# Patient Record
Sex: Male | Born: 1945 | Race: Black or African American | Hispanic: No | Marital: Married | Smoking: Former smoker
Health system: Southern US, Community
[De-identification: ages and names within clinical notes are randomized; demographics above are authoritative.]

## PROBLEM LIST (undated history)

## (undated) DIAGNOSIS — K449 Diaphragmatic hernia without obstruction or gangrene: Secondary | ICD-10-CM

## (undated) DIAGNOSIS — I10 Essential (primary) hypertension: Secondary | ICD-10-CM

## (undated) DIAGNOSIS — T7840XA Allergy, unspecified, initial encounter: Secondary | ICD-10-CM

## (undated) DIAGNOSIS — I700 Atherosclerosis of aorta: Secondary | ICD-10-CM

## (undated) DIAGNOSIS — Z8719 Personal history of other diseases of the digestive system: Secondary | ICD-10-CM

## (undated) DIAGNOSIS — K760 Fatty (change of) liver, not elsewhere classified: Secondary | ICD-10-CM

## (undated) DIAGNOSIS — E785 Hyperlipidemia, unspecified: Secondary | ICD-10-CM

## (undated) DIAGNOSIS — H40009 Preglaucoma, unspecified, unspecified eye: Secondary | ICD-10-CM

## (undated) DIAGNOSIS — K2970 Gastritis, unspecified, without bleeding: Secondary | ICD-10-CM

## (undated) DIAGNOSIS — Z86010 Personal history of colonic polyps: Secondary | ICD-10-CM

## (undated) DIAGNOSIS — C61 Malignant neoplasm of prostate: Secondary | ICD-10-CM

## (undated) DIAGNOSIS — D500 Iron deficiency anemia secondary to blood loss (chronic): Secondary | ICD-10-CM

## (undated) DIAGNOSIS — B9681 Helicobacter pylori [H. pylori] as the cause of diseases classified elsewhere: Secondary | ICD-10-CM

## (undated) DIAGNOSIS — K5790 Diverticulosis of intestine, part unspecified, without perforation or abscess without bleeding: Secondary | ICD-10-CM

## (undated) HISTORY — DX: Preglaucoma, unspecified, unspecified eye: H40.009

## (undated) HISTORY — PX: PROSTATE BIOPSY: SHX241

## (undated) HISTORY — DX: Allergy, unspecified, initial encounter: T78.40XA

## (undated) HISTORY — DX: Malignant neoplasm of prostate: C61

## (undated) HISTORY — DX: Fatty (change of) liver, not elsewhere classified: K76.0

## (undated) HISTORY — DX: Essential (primary) hypertension: I10

## (undated) HISTORY — DX: Iron deficiency anemia secondary to blood loss (chronic): D50.0

## (undated) HISTORY — DX: Helicobacter pylori (H. pylori) as the cause of diseases classified elsewhere: B96.81

## (undated) HISTORY — DX: Diverticulosis of intestine, part unspecified, without perforation or abscess without bleeding: K57.90

## (undated) HISTORY — DX: Personal history of colonic polyps: Z86.010

## (undated) HISTORY — PX: NO PAST SURGERIES: SHX2092

## (undated) HISTORY — DX: Atherosclerosis of aorta: I70.0

## (undated) HISTORY — DX: Diaphragmatic hernia without obstruction or gangrene: K44.9

## (undated) HISTORY — DX: Gastritis, unspecified, without bleeding: K29.70

## (undated) HISTORY — DX: Hyperlipidemia, unspecified: E78.5

---

## 2011-01-12 HISTORY — PX: COLONOSCOPY: SHX174

## 2011-11-04 ENCOUNTER — Ambulatory Visit (INDEPENDENT_AMBULATORY_CARE_PROVIDER_SITE_OTHER): Payer: BC Managed Care – PPO | Admitting: Internal Medicine

## 2011-11-04 ENCOUNTER — Encounter: Payer: Self-pay | Admitting: Internal Medicine

## 2011-11-04 VITALS — BP 154/82 | HR 72 | Temp 97.9°F | Ht 70.25 in | Wt 204.0 lb

## 2011-11-04 DIAGNOSIS — I10 Essential (primary) hypertension: Secondary | ICD-10-CM | POA: Insufficient documentation

## 2011-11-04 DIAGNOSIS — Z0000 Encounter for general adult medical examination without abnormal findings: Secondary | ICD-10-CM

## 2011-11-04 DIAGNOSIS — Z23 Encounter for immunization: Secondary | ICD-10-CM

## 2011-11-04 LAB — CBC WITH DIFFERENTIAL/PLATELET
Basophils Relative: 0.4 % (ref 0.0–3.0)
Eosinophils Relative: 4.5 % (ref 0.0–5.0)
Hemoglobin: 13.7 g/dL (ref 13.0–17.0)
Lymphocytes Relative: 22.8 % (ref 12.0–46.0)
MCV: 89.9 fl (ref 78.0–100.0)
Monocytes Absolute: 0.4 10*3/uL (ref 0.1–1.0)
Neutro Abs: 3.4 10*3/uL (ref 1.4–7.7)
Neutrophils Relative %: 64.2 % (ref 43.0–77.0)
RBC: 4.67 Mil/uL (ref 4.22–5.81)
WBC: 5.3 10*3/uL (ref 4.5–10.5)

## 2011-11-04 NOTE — Assessment & Plan Note (Signed)
Tdap today Declined a flu shot ("I got sick the last time "), benefits discussed  Discussed zostavax EKG-- nsr  labs refer to a colonoscopy, although he is asymptomatic I did find blood in the rectum (no mass or polyps felt.) Skin lesions-- observation, to call if changing color, bleeding or irritation (lesions unchanged  for years) Former pipe smoker, very poor dentition, strongly recommend to see a dentist for dental extraction and cancer screening

## 2011-11-04 NOTE — Patient Instructions (Addendum)
Check the  blood pressure 2 or 3 times a week, be sure it is between 110/60 and 140/85. If it is consistently higher or lower, let me know Come back in 6 months

## 2011-11-04 NOTE — Assessment & Plan Note (Signed)
slt elevated, low salt , exercise See instructions RTC 6 months

## 2011-11-04 NOTE — Progress Notes (Signed)
  Subjective:    Patient ID: Joseph Golden, male    DOB: 1945-08-23, 66 y.o.   MRN: [id number]  HPI New patient, request a CPX, last visit to a doctor approximately 13 years ago. In general feels well, BP today slightly elevated, at a health fair last year he got a similar reading, 154/82. Has 2 skin lesions for years, they have not changed or bleed but he likes me to look at them.  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 No dysuria gross hematuria No anxiety or depression     Objective:   Physical Exam General -- alert, well-developed, and well-nourished.   Neck --no thyromegaly , no LADs HEENT-- poor dentition Lungs -- normal respiratory effort, no intercostal retractions, no accessory muscle use, and normal breath sounds.   Heart-- normal rate, regular rhythm, no murmur, and no gallop.   Abdomen--soft, non-tender, no distention, no masses, no HSM, no guarding, and no rigidity.   Extremities-- no pretibial edema bilaterally Skin-- 1/2 cm skin colored, polypoid, non vascular lesions at the back left thigh  Rectal-- No external abnormalities noted. Normal sphincter tone. No rectal masses or tenderness. Brown stool, + red blood noted, hemoccult (-) Prostate:  Prostate gland firm and smooth, no enlargement, nodularity, tenderness, mass, asymmetry or induration. Neurologic-- alert & oriented X3 and strength normal in all extremities. Psych-- Cognition and judgment appear  intact. Alert and cooperative with normal attention span and concentration.  not anxious appearing and not depressed appearing.      Assessment & Plan:

## 2011-11-05 ENCOUNTER — Encounter: Payer: Self-pay | Admitting: Internal Medicine

## 2011-11-05 LAB — LIPID PANEL
Cholesterol: 213 mg/dL — ABNORMAL HIGH (ref 0–200)
VLDL: 12.4 mg/dL (ref 0.0–40.0)

## 2011-11-05 LAB — COMPREHENSIVE METABOLIC PANEL
ALT: 14 U/L (ref 0–53)
Albumin: 3.6 g/dL (ref 3.5–5.2)
CO2: 28 mEq/L (ref 19–32)
Chloride: 107 mEq/L (ref 96–112)
GFR: 88.45 mL/min (ref >60.00)
Glucose, Bld: 78 mg/dL (ref 70–99)
Potassium: 4.6 mEq/L (ref 3.5–5.1)
Sodium: 140 mEq/L (ref 135–145)
Total Bilirubin: 0.5 mg/dL (ref 0.3–1.2)
Total Protein: 7.3 g/dL (ref 6.0–8.3)

## 2011-11-05 LAB — LDL CHOLESTEROL, DIRECT: Direct LDL: 169.7 mg/dL

## 2011-11-08 ENCOUNTER — Encounter: Payer: Self-pay | Admitting: *Deleted

## 2011-12-16 ENCOUNTER — Encounter: Payer: Self-pay | Admitting: Internal Medicine

## 2011-12-16 ENCOUNTER — Ambulatory Visit (AMBULATORY_SURGERY_CENTER): Payer: BC Managed Care – PPO | Admitting: *Deleted

## 2011-12-16 VITALS — Ht 70.25 in | Wt 200.0 lb

## 2011-12-16 DIAGNOSIS — Z1211 Encounter for screening for malignant neoplasm of colon: Secondary | ICD-10-CM

## 2011-12-16 MED ORDER — SUPREP BOWEL PREP KIT 17.5-3.13-1.6 GM/177ML PO SOLN: ORAL | Status: DC

## 2011-12-30 ENCOUNTER — Encounter: Payer: Self-pay | Admitting: Internal Medicine

## 2011-12-30 ENCOUNTER — Ambulatory Visit (AMBULATORY_SURGERY_CENTER): Payer: BC Managed Care – PPO | Admitting: Internal Medicine

## 2011-12-30 VITALS — BP 193/109 | HR 59 | Temp 98.5°F | Resp 19 | Ht 70.25 in | Wt 200.0 lb

## 2011-12-30 DIAGNOSIS — Z860100 Personal history of colon polyps, unspecified: Secondary | ICD-10-CM

## 2011-12-30 DIAGNOSIS — K5730 Diverticulosis of large intestine without perforation or abscess without bleeding: Secondary | ICD-10-CM

## 2011-12-30 DIAGNOSIS — D126 Benign neoplasm of colon, unspecified: Secondary | ICD-10-CM

## 2011-12-30 DIAGNOSIS — Z1211 Encounter for screening for malignant neoplasm of colon: Secondary | ICD-10-CM

## 2011-12-30 HISTORY — DX: Personal history of colon polyps, unspecified: Z86.0100

## 2011-12-30 HISTORY — DX: Personal history of colonic polyps: Z86.010

## 2011-12-30 MED ORDER — SODIUM CHLORIDE 0.9 % IV SOLN: 500.0000 mL | INTRAVENOUS | Status: DC

## 2011-12-30 NOTE — Patient Instructions (Addendum)
One polyp was removed from the colon. You also have diverticulosis. Please read the handouts provided.  Your blood pressure was elevated today - I recommend you make an appointment with Dr. Drue Novel to review and take the printout of the blood pressures we gave you for him to see.  I will send a letter about the polyp results and when to have another colonoscopy.  Thank you for choosing me and [facility].  Iva Boop, MD, FACG   YOU HAD AN ENDOSCOPIC PROCEDURE TODAY AT THE [facility]: Refer to the procedure report that was given to you for any specific questions about what was found during the examination.  If the procedure report does not answer your questions, please call your gastroenterologist to clarify.  If you requested that your care partner not be given the details of your procedure findings, then the procedure report has been included in a sealed envelope for you to review at your convenience later.  YOU SHOULD EXPECT: Some feelings of bloating in the abdomen. Passage of more gas than usual.  Walking can help get rid of the air that was put into your GI tract during the procedure and reduce the bloating. If you had a lower endoscopy (such as a colonoscopy or flexible sigmoidoscopy) you may notice spotting of blood in your stool or on the toilet paper. If you underwent a bowel prep for your procedure, then you may not have a normal bowel movement for a few days.  DIET: Your first meal following the procedure should be a light meal and then it is ok to progress to your normal diet.  A half-sandwich or bowl of soup is an example of a good first meal.  Heavy or fried foods are harder to digest and may make you feel nauseous or bloated.  Likewise meals heavy in dairy and vegetables can cause extra gas to form and this can also increase the bloating.  Drink plenty of fluids but you should avoid alcoholic beverages for 24 hours.  ACTIVITY: Your care partner should  take you home directly after the procedure.  You should plan to take it easy, moving slowly for the rest of the day.  You can resume normal activity the day after the procedure however you should NOT DRIVE or use heavy machinery for 24 hours (because of the sedation medicines used during the test).    SYMPTOMS TO REPORT IMMEDIATELY: A gastroenterologist can be reached at any hour.  During normal business hours, 8:30 AM to 5:00 PM Monday through Friday, call [phone].  After hours and on weekends, please call the GI answering service at [phone] who will take a message and have the physician on call contact you.   Following lower endoscopy (colonoscopy or flexible sigmoidoscopy):  Excessive amounts of blood in the stool  Significant tenderness or worsening of abdominal pains  Swelling of the abdomen that is new, acute  Fever of 100F or higher  FOLLOW UP: If any biopsies were taken you will be contacted by phone or by letter within the next 1-3 weeks.  Call your gastroenterologist if you have not heard about the biopsies in 3 weeks.  Our staff will call the home number listed on your records the next business day following your procedure to check on you and address any questions or concerns that you may have at that time regarding the information given to you following your procedure. This is a courtesy call and so if there is  no answer at the home number and we have not heard from you through the emergency physician on call, we will assume that you have returned to your regular daily activities without incident.  SIGNATURES/CONFIDENTIALITY: You and/or your care partner have signed paperwork which will be entered into your electronic medical record.  These signatures attest to the fact that that the information above on your After Visit Summary has been reviewed and is understood.  Full responsibility of the confidentiality of this discharge information lies with you and/or your  care-partner.   Polyps-handout given  Diverticulosis-handout given  High fiber diet-handout given

## 2011-12-30 NOTE — Op Note (Signed)
[facility] [address] [place], [address]   COLONOSCOPY PROCEDURE REPORT  PATIENT: Joseph Golden, Joseph Golden  MR#: [id number] BIRTHDATE: 1945-05-17 , 66  yrs. old GENDER: Male ENDOSCOPIST: Iva Boop, MD, [facility] REFERRED XB:JYNW Drue Novel, M.D. PROCEDURE DATE:  12/30/2011 PROCEDURE:   Colonoscopy with snare polypectomy ASA CLASS:   Class II INDICATIONS:average risk screening. MEDICATIONS: propofol (Diprivan) 300mg  IV, MAC sedation, administered by CRNA, and These medications were titrated to patient response per physician's verbal order  DESCRIPTION OF PROCEDURE:   After the risks benefits and alternatives of the procedure were thoroughly explained, informed consent was obtained.  A digital rectal exam revealed no abnormalities of the rectum and A digital rectal exam revealed the prostate was not enlarged.   The LB CF-[id number]  endoscope was introduced through the anus and advanced to the cecum, which was identified by both the appendix and ileocecal valve. No adverse events experienced.   The quality of the prep was Suprep excellent The instrument was then slowly withdrawn as the colon was fully examined.      COLON FINDINGS: A polypoid shaped pedunculated polyp measuring 12 mm in size was found in the sigmoid colon.  A polypectomy was performed using snare cautery.  The resection was complete and the polyp tissue was completely retrieved.   Moderate diverticulosis was noted in the sigmoid colon.   The colon mucosa was otherwise normal.   A right colon retroflexion was performed.  Retroflexed views revealed no abnormalities. The time to cecum=3 minutes 05 seconds.  Withdrawal time=12 minutes 18 seconds.  The scope was withdrawn and the procedure completed. COMPLICATIONS: There were no complications.  ENDOSCOPIC IMPRESSION: 1.   Pedunculated polyp measuring 12 mm in size was found in the sigmoid colon; polypectomy was performed using snare cautery 2.    Moderate diverticulosis was noted in the sigmoid colon 3.   The colon mucosa was otherwise normal - excellent prep  RECOMMENDATIONS: 1.  Hold aspirin, aspirin products, and anti-inflammatory medication for 2 weeks. 2.  Timing of repeat colonoscopy will be determined by pathology findings. 3.  return to Dr. Drue Novel soon to follow-up elevated blood pressure - may need treatment to prevent complications of high blood pressure eSigned:  Iva Boop, MD, [facility] 12/30/2011 10:30 AM   cc: The Patient and Willow Ora, MD

## 2011-12-30 NOTE — Progress Notes (Signed)
Called to room to assist during endoscopic procedure.  Patient ID and intended procedure confirmed with present staff. Received instructions for my participation in the procedure from the performing physician. ewm 

## 2011-12-30 NOTE — Progress Notes (Signed)
Patient did not experience any of the following events: a burn prior to discharge; a fall within the facility; wrong site/side/patient/procedure/implant event; or a hospital transfer or hospital admission upon discharge from the facility. (G8907) Patient did not have preoperative order for IV antibiotic SSI prophylaxis. (G8918)  

## 2011-12-31 ENCOUNTER — Telehealth: Payer: Self-pay | Admitting: *Deleted

## 2011-12-31 NOTE — Telephone Encounter (Signed)
  Follow up Call-  Call back number 12/30/2011  Post procedure Call Back phone  # [phone]  Permission to leave phone message Yes     Patient questions:  Do you have a fever, pain , or abdominal swelling? no Pain Score  0 *  Have you tolerated food without any problems? yes  Have you been able to return to your normal activities? yes  Do you have any questions about your discharge instructions: Diet   no Medications  no Follow up visit  no  Do you have questions or concerns about your Care? no  Actions: * If pain score is 4 or above: No action needed, pain <4.

## 2012-01-07 ENCOUNTER — Encounter: Payer: Self-pay | Admitting: Internal Medicine

## 2012-01-07 DIAGNOSIS — Z860100 Personal history of colon polyps, unspecified: Secondary | ICD-10-CM | POA: Insufficient documentation

## 2012-01-07 NOTE — Progress Notes (Signed)
Quick Note:  Adenoma with high-grade dysplasia in 70% Fragmented specimen, cannot ensure complete removal though I believe so For completeness will plan on flex sig in 3 months approximately (March 2014) ______

## 2012-03-24 ENCOUNTER — Encounter: Payer: Self-pay | Admitting: Internal Medicine

## 2012-05-04 ENCOUNTER — Ambulatory Visit (INDEPENDENT_AMBULATORY_CARE_PROVIDER_SITE_OTHER): Payer: BC Managed Care – PPO | Admitting: Internal Medicine

## 2012-05-04 ENCOUNTER — Encounter: Payer: Self-pay | Admitting: Internal Medicine

## 2012-05-04 VITALS — BP 150/90 | HR 69 | Temp 98.0°F | Wt 202.0 lb

## 2012-05-04 DIAGNOSIS — Z86010 Personal history of colonic polyps: Secondary | ICD-10-CM

## 2012-05-04 DIAGNOSIS — I10 Essential (primary) hypertension: Secondary | ICD-10-CM

## 2012-05-04 MED ORDER — LOSARTAN POTASSIUM-HCTZ 50-12.5 MG PO TABS: 1.0000 | ORAL_TABLET | Freq: Every day | ORAL | Status: DC

## 2012-05-04 NOTE — Patient Instructions (Addendum)
Started a new BP medication, take one in the morning. Check the  blood pressure 2 or 3 times a week, be sure it is between 110/60 and 140/85. If it is consistently higher or lower, let me know Please come back in 2 or 3 weeks: BMP--- dx  hypertension FLP --- dx mild hyperlipidemia Next office visit with me in 4 months

## 2012-05-04 NOTE — Assessment & Plan Note (Addendum)
Based on ambulatory BPs and BP today, he has hypertension. He already has improved his lifestyle. Plan: Losartan, BMP in 2 weeks. Will also check FLP, cholesterol was slightly elevated.

## 2012-05-04 NOTE — Progress Notes (Signed)
  Subjective:    Patient ID: Joseph Golden, male    DOB: 01-14-1945, 67 y.o.   MRN: [id number]  HPI Follow up from previous visit Elevated BP-- has improved his diet, not eating salt as much as he did before. Ambulatory BPs are still elevated at around 150- 140/90- 97. Had  a colonoscopy, see assessment and plan. Has a  dark spot in the back for one month, see physical exam and recommendations.Area was somehow tender at the beginning.  Past Medical History  Diagnosis Date  . 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 HTN (hypertension)    Past Surgical History  Procedure Laterality Date  . No past surgeries       Review of Systems Denies blood in the stools. No chest pain, nosebleeds or headaches.    Objective:   Physical Exam  Skin:       General -- alert, well-developed, No apparent distress  Lungs -- normal respiratory effort, no intercostal retractions, no accessory muscle use, and normal breath sounds.   Heart-- normal rate, regular rhythm, no murmur, and no gallop.   Extremities-- no pretibial edema bilaterally Neurologic-- alert & oriented X3 and strength normal in all extremities. Psych-- Cognition and judgment appear intact. Alert and cooperative with normal attention span and concentration.  not anxious appearing and not depressed appearing.        Assessment & Plan:  "Skin lesion" consistent with a ecchymoses, recommend observation, if the area is not back to normal and in one month, he is to let me know.

## 2012-05-04 NOTE — Assessment & Plan Note (Signed)
Due for a repeat at scope (flex bronchoscopy). Encouraged to call GI, states he will

## 2012-05-23 ENCOUNTER — Other Ambulatory Visit (INDEPENDENT_AMBULATORY_CARE_PROVIDER_SITE_OTHER): Payer: BC Managed Care – PPO

## 2012-05-23 DIAGNOSIS — I10 Essential (primary) hypertension: Secondary | ICD-10-CM

## 2012-05-23 LAB — BASIC METABOLIC PANEL
BUN: 18 mg/dL (ref 6–23)
Creatinine, Ser: 1.2 mg/dL (ref 0.4–1.5)
GFR: 82.12 mL/min (ref >60.00)
Potassium: 3.7 mEq/L (ref 3.5–5.1)

## 2012-05-23 LAB — LIPID PANEL
Cholesterol: 207 mg/dL — ABNORMAL HIGH (ref 0–200)
VLDL: 11 mg/dL (ref 0.0–40.0)

## 2012-08-31 ENCOUNTER — Encounter: Payer: Self-pay | Admitting: Internal Medicine

## 2012-08-31 ENCOUNTER — Ambulatory Visit (INDEPENDENT_AMBULATORY_CARE_PROVIDER_SITE_OTHER): Payer: Medicare HMO | Admitting: Internal Medicine

## 2012-08-31 VITALS — BP 160/60 | HR 61 | Temp 98.4°F | Wt 200.0 lb

## 2012-08-31 DIAGNOSIS — Z86010 Personal history of colonic polyps: Secondary | ICD-10-CM

## 2012-08-31 DIAGNOSIS — I10 Essential (primary) hypertension: Secondary | ICD-10-CM

## 2012-08-31 MED ORDER — AMLODIPINE BESYLATE 10 MG PO TABS: 10.0000 mg | ORAL_TABLET | Freq: Every day | ORAL | Status: DC

## 2012-08-31 NOTE — Progress Notes (Signed)
  Subjective:    Patient ID: Joseph Golden, male    DOB: 04/21/1945, 67 y.o.   MRN: [id number]  HPI F/U Hypertension, started losartan HCT, good compliance, ambulatory BP is still range from 160/94, 180/100 No apparent side effects however he reports that when he "pays attention" he has some tinnitus which is mild and not bothersome  Past Medical History  Diagnosis Date  . 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 HTN (hypertension)    Past Surgical History  Procedure Laterality Date  . No past surgeries       Review of Systems Did not call  GI for flex sigmoidoscopy Denies chest pain, shortness of breath. No blood in the stools. Hearing is normal.     Objective:   Physical Exam  BP 160/60  Pulse 61  Temp(Src) 98.4 F (36.9 C)  Wt 200 lb (90.719 kg)  BMI 28.5 kg/m2  SpO2 96% General -- alert, well-developed, NAD.   Lungs -- normal respiratory effort, no intercostal retractions, no accessory muscle use, and normal breath sounds.  Heart-- normal rate, regular rhythm, no murmur.  Skin--Previously described ecchymosis in the back  is gone,  does home macular  hyperpigmentation. Psych-- Cognition and judgment appear intact. Alert and cooperative with normal attention span and concentration. not anxious appearing and not depressed appearing.      Assessment & Plan:   Skin abnormality in the back, see last OV,  likely postinflammatory hyperpigmentation base on today's exam, recommend observation, will call if changes

## 2012-08-31 NOTE — Patient Instructions (Addendum)
We are adding a medication called  amlodipine, one tablet every day. Your BP should be getting better in the next few weeks. Please continue checking, goal is  less than 140/80. If you have any side effects please let us know. Next visit 2 months for a physical, please make an appointment

## 2012-08-31 NOTE — Assessment & Plan Note (Signed)
Due for a repeat flex sigmoidoscopy, did not call GI. \plan-- GI referral

## 2012-08-31 NOTE — Assessment & Plan Note (Signed)
Tolerated  losartan HCT 50-12.5  . BP needs better control, Because a question of tinnitus, I don't like to increase the dose of current meds Plan:  Add Amlodipine 10 mg

## 2012-09-01 ENCOUNTER — Other Ambulatory Visit: Payer: Self-pay | Admitting: Internal Medicine

## 2012-09-01 NOTE — Telephone Encounter (Signed)
Refill done per protocol.  

## 2012-10-27 ENCOUNTER — Telehealth: Payer: Self-pay

## 2012-10-27 NOTE — Telephone Encounter (Signed)
Medication List and allergies: done  90 day supply/mail order: none Local prescriptions: [facility]  Immunizations due: admin flu vaccine upon arrival  A/P:  LAST: PSA: WNL 10/2011   CCS: Follow up scheduled for 11/2014 with Dr Leone Payor DM:  Due 05/2012 Eye Exam:   HTN: Due 08/2012 Lipids: Due 05/2012  Recent family history or surgical procedures: none   To Discuss with Provider: none

## 2012-10-31 ENCOUNTER — Encounter: Payer: Self-pay | Admitting: Internal Medicine

## 2012-10-31 ENCOUNTER — Ambulatory Visit (INDEPENDENT_AMBULATORY_CARE_PROVIDER_SITE_OTHER): Payer: Medicare HMO | Admitting: Internal Medicine

## 2012-10-31 VITALS — BP 131/77 | HR 81 | Temp 98.4°F | Ht 70.0 in | Wt 200.0 lb

## 2012-10-31 DIAGNOSIS — Z23 Encounter for immunization: Secondary | ICD-10-CM

## 2012-10-31 DIAGNOSIS — Z125 Encounter for screening for malignant neoplasm of prostate: Secondary | ICD-10-CM

## 2012-10-31 DIAGNOSIS — E785 Hyperlipidemia, unspecified: Secondary | ICD-10-CM

## 2012-10-31 DIAGNOSIS — I10 Essential (primary) hypertension: Secondary | ICD-10-CM

## 2012-10-31 DIAGNOSIS — Z0000 Encounter for general adult medical examination without abnormal findings: Secondary | ICD-10-CM

## 2012-10-31 HISTORY — DX: Hyperlipidemia, unspecified: E78.5

## 2012-10-31 LAB — CBC WITH DIFFERENTIAL/PLATELET
Basophils Relative: 0.2 % (ref 0.0–3.0)
Eosinophils Relative: 3.5 % (ref 0.0–5.0)
HCT: 36.2 % — ABNORMAL LOW (ref 39.0–52.0)
Lymphs Abs: 1 10*3/uL (ref 0.7–4.0)
MCV: 86.4 fl (ref 78.0–100.0)
Monocytes Absolute: 0.6 10*3/uL (ref 0.1–1.0)
RBC: 4.19 Mil/uL — ABNORMAL LOW (ref 4.22–5.81)
WBC: 7.6 10*3/uL (ref 4.5–10.5)

## 2012-10-31 LAB — BASIC METABOLIC PANEL
Chloride: 103 mEq/L (ref 96–112)
Potassium: 4.4 mEq/L (ref 3.5–5.1)
Sodium: 139 mEq/L (ref 135–145)

## 2012-10-31 LAB — LIPID PANEL: Total CHOL/HDL Ratio: 3

## 2012-10-31 LAB — PSA: PSA: 2.31 ng/mL (ref 0.10–4.00)

## 2012-10-31 MED ORDER — ZOSTER VACCINE LIVE 19400 UNT/0.65ML ~~LOC~~ SOLR: 0.6500 mL | Freq: Once | SUBCUTANEOUS | Status: DC

## 2012-10-31 MED ORDER — LOSARTAN POTASSIUM-HCTZ 50-12.5 MG PO TABS: ORAL_TABLET | ORAL | Status: DC

## 2012-10-31 MED ORDER — AMLODIPINE BESYLATE 10 MG PO TABS: 10.0000 mg | ORAL_TABLET | Freq: Every day | ORAL | Status: DC

## 2012-10-31 NOTE — Assessment & Plan Note (Signed)
Tdap 2013 Got a flu shot today Discussed zostavax-- rx provided   12-2011 colonoscopy, had  A large polyp, f/u by GI Former pipe smoker, again this year recommend to see a dentist for dental extraction and cancer screening Diet and exercise discussed. Labs

## 2012-10-31 NOTE — Assessment & Plan Note (Signed)
BP today is satisfactory, ambulatory BPs mostly ~ 130/80. No change, labs

## 2012-10-31 NOTE — Progress Notes (Signed)
Subjective:    Patient ID: Joseph Golden, male    DOB: 1945-09-25, 67 y.o.   MRN: [id number]  HPI Here for Medicare AWV: 1. Risk factors based on Past M, S, F history: reviewed 2. Physical Activities:  Active , exercises M-F has mini gym at home, active in the yard 3. Depression/mood: neg screening  4. Hearing:  No problemss noted or reported  5. ADL's:  Independent  6. Fall Risk: no recent fall, see instructions  7. home Safety: does feel safe at home  8. Height, weight, & visual acuity: see VS, saw the ey doctor 05-2011, got new glasses, rec yearly visit  9. Counseling: provided 10. Labs ordered based on risk factors: if needed  11. Referral Coordination: if needed 12. Care Plan, see assessment and plan  13. Cognitive Assessment: cognition and motor skills wnl for age   In addition, today we discussed the following: HTN-- good med compliance , amb BPs usually in the 130s (range 120-160), DBP 80s  Past Medical History  Diagnosis Date  . 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 HTN (hypertension)    Past Surgical History  Procedure Laterality Date  . No past surgeries     History   Social History  . Marital Status: Married    Spouse Name: N/A    Number of Children: 5  . Years of Education: N/A   Occupational History  . retired     Social History Main Topics  . Smoking status: Former Games developer  . Smokeless tobacco: Never Used     Comment: years ago  . Alcohol Use: 1.2 oz/week    2 Cans of beer per week     Comment: socially   . Drug Use: No  . Sexual Activity: Not on file   Other Topics Concern  . Not on file   Social History Narrative   Lives with wife , and 2 daughters    Family History  Problem Relation Age of Onset  . Colon cancer Neg Hx   . Esophageal cancer Neg Hx   . Rectal cancer Neg Hx   . Stomach cancer Neg Hx   . Prostate cancer Neg Hx   . Diabetes Neg Hx   . CAD Neg Hx   . CVA  Other     uncle     Review of Systems Diet-- healthy No  CP, SOB, lower extremity edema Denies  nausea, vomiting diarrhea Denies  blood in the stools (-) cough, sputum production (-) wheezing, chest congestion No dysuria, gross hematuria, difficulty urinating          Objective:   Physical Exam BP 131/77  Pulse 81  Temp(Src) 98.4 F (36.9 C)  Ht 5\' 10"  (1.778 m)  Wt 200 lb (90.719 kg)  BMI 28.7 kg/m2  SpO2 97% General -- alert, well-developed, NAD.  Neck --  no LAD HEENT-- Not pale.  Mouth: Has a single loose teeth, no obvious mucosal lesions (but rec to see the dentist) Lungs -- normal respiratory effort, no intercostal retractions, no accessory muscle use, and normal breath sounds.  Heart-- normal rate, regular rhythm, no murmur.  Abdomen-- Not distended, good bowel sounds,soft, non-tender. Rectal-- single small external skin tag noted. Normal sphincter tone. No rectal masses or tenderness. Brown stool Prostate--Prostate gland firm and smooth, no enlargement, nodularity, tenderness, mass, asymmetry or induration. Extremities-- no pretibial edema bilaterally  Neurologic--  alert & oriented X3.  Speech normal, gait normal, strength normal in all extremities.  Psych-- Cognition and judgment appear intact. Cooperative with normal attention span and concentration. No anxious appearing , no depressed appearing.       Assessment & Plan:

## 2012-10-31 NOTE — Patient Instructions (Signed)
Get your blood work before you leave   Check the  blood pressure 2 or 3 times a month be sure it is between 110/60 and 140/85. Ideal blood pressure is 120/80. If it is consistently higher or lower, let me know  Next visit in  1 year  for a physical exam , If your blood pressure is not well-controlled, come back sooner  Fall Prevention and Home Safety Falls cause injuries and can affect all age groups. It is possible to use preventive measures to significantly decrease the likelihood of falls. There are many simple measures which can make your home safer and prevent falls. OUTDOORS  Repair cracks and edges of walkways and driveways.  Remove high doorway thresholds.  Trim shrubbery on the main path into your home.  Have good outside lighting.  Clear walkways of tools, rocks, debris, and clutter.  Check that handrails are not broken and are securely fastened. Both sides of steps should have handrails.  Have leaves, snow, and ice cleared regularly.  Use sand or salt on walkways during winter months.  In the garage, clean up grease or oil spills. BATHROOM  Install night lights.  Install grab bars by the toilet and in the tub and shower.  Use non-skid mats or decals in the tub or shower.  Place a plastic non-slip stool in the shower to sit on, if needed.  Keep floors dry and clean up all water on the floor immediately.  Remove soap buildup in the tub or shower on a regular basis.  Secure bath mats with non-slip, double-sided rug tape.  Remove throw rugs and tripping hazards from the floors. BEDROOMS  Install night lights.  Make sure a bedside light is easy to reach.  Do not use oversized bedding.  Keep a telephone by your bedside.  Have a firm chair with side arms to use for getting dressed.  Remove throw rugs and tripping hazards from the floor. KITCHEN  Keep handles on pots and pans turned toward the center of the stove. Use back burners when possible.  Clean  up spills quickly and allow time for drying.  Avoid walking on wet floors.  Avoid hot utensils and knives.  Position shelves so they are not too high or low.  Place commonly used objects within easy reach.  If necessary, use a sturdy step stool with a grab bar when reaching.  Keep electrical cables out of the way.  Do not use floor polish or wax that makes floors slippery. If you must use wax, use non-skid floor wax.  Remove throw rugs and tripping hazards from the floor. STAIRWAYS  Never leave objects on stairs.  Place handrails on both sides of stairways and use them. Fix any loose handrails. Make sure handrails on both sides of the stairways are as long as the stairs.  Check carpeting to make sure it is firmly attached along stairs. Make repairs to worn or loose carpet promptly.  Avoid placing throw rugs at the top or bottom of stairways, or properly secure the rug with carpet tape to prevent slippage. Get rid of throw rugs, if possible.  Have an electrician put in a light switch at the top and bottom of the stairs. OTHER FALL PREVENTION TIPS  Wear low-heel or rubber-soled shoes that are supportive and fit well. Wear closed toe shoes.  When using a stepladder, make sure it is fully opened and both spreaders are firmly locked. Do not climb a closed stepladder.  Add color or contrast  paint or tape to grab bars and handrails in your home. Place contrasting color strips on first and last steps.  Learn and use mobility aids as needed. Install an electrical emergency response system.  Turn on lights to avoid dark areas. Replace light bulbs that burn out immediately. Get light switches that glow.  Arrange furniture to create clear pathways. Keep furniture in the same place.  Firmly attach carpet with non-skid or double-sided tape.  Eliminate uneven floor surfaces.  Select a carpet pattern that does not visually hide the edge of steps.  Be aware of all pets. OTHER HOME  SAFETY TIPS  Set the water temperature for 120 F (48.8 C).  Keep emergency numbers on or near the telephone.  Keep smoke detectors on every level of the home and near sleeping areas. Document Released: 12/18/2001 Document Revised: 06/29/2011 Document Reviewed: 03/19/2011 [facility] Patient Information 2014 [place], [place].

## 2012-11-02 ENCOUNTER — Encounter: Payer: Self-pay | Admitting: *Deleted

## 2012-11-02 ENCOUNTER — Ambulatory Visit (AMBULATORY_SURGERY_CENTER): Payer: Self-pay | Admitting: *Deleted

## 2012-11-02 VITALS — Ht 70.5 in | Wt 201.4 lb

## 2012-11-02 DIAGNOSIS — Z86010 Personal history of colonic polyps: Secondary | ICD-10-CM

## 2012-11-02 NOTE — Progress Notes (Signed)
No allergies to eggs or soy. No problems with anesthesia.  

## 2012-11-08 ENCOUNTER — Encounter: Payer: Self-pay | Admitting: Internal Medicine

## 2012-11-16 ENCOUNTER — Ambulatory Visit (AMBULATORY_SURGERY_CENTER): Payer: Medicare HMO | Admitting: Internal Medicine

## 2012-11-16 ENCOUNTER — Encounter: Payer: Self-pay | Admitting: Internal Medicine

## 2012-11-16 VITALS — BP 144/89 | HR 58 | Temp 96.8°F | Resp 15 | Ht 70.0 in | Wt 201.0 lb

## 2012-11-16 DIAGNOSIS — K648 Other hemorrhoids: Secondary | ICD-10-CM

## 2012-11-16 DIAGNOSIS — K5790 Diverticulosis of intestine, part unspecified, without perforation or abscess without bleeding: Secondary | ICD-10-CM

## 2012-11-16 DIAGNOSIS — D126 Benign neoplasm of colon, unspecified: Secondary | ICD-10-CM

## 2012-11-16 DIAGNOSIS — Z86010 Personal history of colonic polyps: Secondary | ICD-10-CM

## 2012-11-16 DIAGNOSIS — K5730 Diverticulosis of large intestine without perforation or abscess without bleeding: Secondary | ICD-10-CM

## 2012-11-16 HISTORY — DX: Diverticulosis of intestine, part unspecified, without perforation or abscess without bleeding: K57.90

## 2012-11-16 MED ORDER — SODIUM CHLORIDE 0.9 % IV SOLN: 500.0000 mL | INTRAVENOUS | Status: DC

## 2012-11-16 NOTE — Patient Instructions (Addendum)
The polyp was gone. You do have diverticulosis still and some internal hemorrhoids.  If you have hemorrhoid problems (swelling, itching, bleeding) I am able to treat those with an in-office procedure. If you like, please call my office at [phone] to schedule an appointment and I can evaluate you further.  Next colonoscopy around 12/2014.  I appreciate the opportunity to care for you. Iva Boop, MD, FACG  YOU HAD AN ENDOSCOPIC PROCEDURE TODAY AT THE [facility]: Refer to the procedure report that was given to you for any specific questions about what was found during the examination.  If the procedure report does not answer your questions, please call your gastroenterologist to clarify.  If you requested that your care partner not be given the details of your procedure findings, then the procedure report has been included in a sealed envelope for you to review at your convenience later.  YOU SHOULD EXPECT: Some feelings of bloating in the abdomen. Passage of more gas than usual.  Walking can help get rid of the air that was put into your GI tract during the procedure and reduce the bloating. If you had a lower endoscopy (such as a colonoscopy or flexible sigmoidoscopy) you may notice spotting of blood in your stool or on the toilet paper. If you underwent a bowel prep for your procedure, then you may not have a normal bowel movement for a few days.  DIET: Your first meal following the procedure should be a light meal and then it is ok to progress to your normal diet.  A half-sandwich or bowl of soup is an example of a good first meal.  Heavy or fried foods are harder to digest and may make you feel nauseous or bloated.  Likewise meals heavy in dairy and vegetables can cause extra gas to form and this can also increase the bloating.  Drink plenty of fluids but you should avoid alcoholic beverages for 24 hours.  ACTIVITY: Your care partner should take you home directly after the  procedure.  You should plan to take it easy, moving slowly for the rest of the day.  You can resume normal activity the day after the procedure however you should NOT DRIVE or use heavy machinery for 24 hours (because of the sedation medicines used during the test).    SYMPTOMS TO REPORT IMMEDIATELY: A gastroenterologist can be reached at any hour.  During normal business hours, 8:30 AM to 5:00 PM Monday through Friday, call [phone].  After hours and on weekends, please call the GI answering service at [phone] who will take a message and have the physician on call contact you.   Following lower endoscopy (colonoscopy or flexible sigmoidoscopy):  Excessive amounts of blood in the stool  Significant tenderness or worsening of abdominal pains  Swelling of the abdomen that is new, acute  Fever of 100F or higher  FOLLOW UP: If any biopsies were taken you will be contacted by phone or by letter within the next 1-3 weeks.  Call your gastroenterologist if you have not heard about the biopsies in 3 weeks.  Our staff will call the home number listed on your records the next business day following your procedure to check on you and address any questions or concerns that you may have at that time regarding the information given to you following your procedure. This is a courtesy call and so if there is no answer at the home number and we have not heard from  you through the emergency physician on call, we will assume that you have returned to your regular daily activities without incident.  SIGNATURES/CONFIDENTIALITY: You and/or your care partner have signed paperwork which will be entered into your electronic medical record.  These signatures attest to the fact that that the information above on your After Visit Summary has been reviewed and is understood.  Full responsibility of the confidentiality of this discharge information lies with you and/or your care-partner.  Diverticulosis,  hemorrhoids-handouts given  Colonoscopy in 2016

## 2012-11-16 NOTE — Op Note (Signed)
[facility] [address] [place], [address]   FLEXIBLE SIGMOIDOSCOPY PROCEDURE REPORT  PATIENT: Joseph, Golden  MR#: [id number] BIRTHDATE: [birth year] , 67  yrs. old GENDER: Male ENDOSCOPIST: Iva Boop, MD, [facility] PROCEDURE DATE:  11/16/2012 PROCEDURE:   Sigmoidoscopy, diagnostic ASA CLASS:   Class II INDICATIONS:follow up of colonic polyp - sigmoid polyp w/ high-grade dysplasia MEDICATIONS: propofol (Diprivan) 100mg  IV, MAC sedation, administered by CRNA, and These medications were titrated to patient response per physician's verbal order  DESCRIPTION OF PROCEDURE:   After the risks benefits and alternatives of the procedure were thoroughly explained, informed consent was obtained.  revealed no abnormalities of the rectum. The LB [id number]-[id number]  endoscope was introduced through the anus  and advanced to the descending colon , limited by No adverse events experienced.   The quality of the prep was good .  The instrument was then slowly withdrawn as the mucosa was fully examined.         COLON FINDINGS: Severe diverticulosis was noted in the sigmoid colon and descending colon. Retroflexed views revealed internal hemorrhoid.    The scope was then withdrawn from the patient and the procedure terminated.  COMPLICATIONS: There were no complications.  ENDOSCOPIC IMPRESSION: 1.   Severe diverticulosis was noted in the sigmoid colon and descending colon 2.   Internal hemorrhoids 3. No residual sigmoid polyp  RECOMMENDATIONS: colonoscopy 12/2014    eSigned:  Iva Boop, MD, [facility] 11/16/2012 10:01 AM   CC:The Patient

## 2012-11-16 NOTE — Progress Notes (Signed)
Lidocaine-40mg IV prior to Propofol InductionPropofol given over incremental dosages 

## 2012-11-16 NOTE — Progress Notes (Signed)
Patient did not experience any of the following events: a burn prior to discharge; a fall within the facility; wrong site/side/patient/procedure/implant event; or a hospital transfer or hospital admission upon discharge from the facility. (G8907) Patient did not have preoperative order for IV antibiotic SSI prophylaxis. (G8918)  

## 2012-11-17 ENCOUNTER — Telehealth: Payer: Self-pay | Admitting: *Deleted

## 2012-11-17 NOTE — Telephone Encounter (Signed)
  Follow up Call-  Call back number 11/16/2012 12/30/2011  Post procedure Call Back phone  # [phone]  Permission to leave phone message Yes Yes     Patient questions:  Do you have a fever, pain , or abdominal swelling? no Pain Score  0 *  Have you tolerated food without any problems? yes  Have you been able to return to your normal activities? yes  Do you have any questions about your discharge instructions: Diet   no Medications  no Follow up visit  no  Do you have questions or concerns about your Care? no  Actions: * If pain score is 4 or above: No action needed, pain <4.

## 2012-12-01 ENCOUNTER — Telehealth: Payer: Self-pay

## 2012-12-01 DIAGNOSIS — D649 Anemia, unspecified: Secondary | ICD-10-CM

## 2012-12-01 NOTE — Telephone Encounter (Signed)
Patient advised He will come one day the beginning of next week.   Labs entered

## 2012-12-01 NOTE — Telephone Encounter (Signed)
Message copied by Annett Fabian on Fri Dec 01, 2012 10:43 AM ------      Message from: Iva Boop      Created: Fri Dec 01, 2012  7:43 AM       Late f/u on this            I did a flex sig on him to check for a residual polyp            I will call him and check a B12 and ferritin and tell him we are working together on it - also check CBC Lavonna Rua, please do this)            Will keep you posted            Baldo Ash      ----- Message -----         From: Wanda Plump, MD         Sent: 11/02/2012   2:05 PM           To: Iva Boop, MD            Dirk Dress the patient for a physical, Hg slt low,  has an appointment to see you in few days. Just a FYI       Thank you      [facility]       ------

## 2012-12-05 ENCOUNTER — Other Ambulatory Visit (INDEPENDENT_AMBULATORY_CARE_PROVIDER_SITE_OTHER): Payer: Medicare HMO

## 2012-12-05 DIAGNOSIS — D649 Anemia, unspecified: Secondary | ICD-10-CM

## 2012-12-05 LAB — CBC WITH DIFFERENTIAL/PLATELET
Basophils Relative: 0.1 % (ref 0.0–3.0)
Eosinophils Absolute: 0.3 10*3/uL (ref 0.0–0.7)
Eosinophils Relative: 5.2 % — ABNORMAL HIGH (ref 0.0–5.0)
HCT: 34.7 % — ABNORMAL LOW (ref 39.0–52.0)
Lymphs Abs: 1.4 10*3/uL (ref 0.7–4.0)
MCHC: 33.5 g/dL (ref 30.0–36.0)
MCV: 86.8 fl (ref 78.0–100.0)
Monocytes Absolute: 0.5 10*3/uL (ref 0.1–1.0)
Neutro Abs: 4.1 10*3/uL (ref 1.4–7.7)
Platelets: 267 10*3/uL (ref 150.0–400.0)
RBC: 4 Mil/uL — ABNORMAL LOW (ref 4.22–5.81)
WBC: 6.4 10*3/uL (ref 4.5–10.5)

## 2012-12-05 LAB — VITAMIN B12: Vitamin B-12: 387 pg/mL (ref 211–911)

## 2012-12-11 NOTE — Progress Notes (Signed)
Quick Note:  Let him know iron is low Hgb slightly low Should take ferrous sulfate 325 mg bid if not taking An EGD to look for leaking of blood from stomach is appropriate - I recommend this - please schedule if he agrees or he can schedule an REV to discuss  I am ccing Dr. Drue Novel ______

## 2012-12-12 ENCOUNTER — Other Ambulatory Visit: Payer: Self-pay

## 2012-12-12 MED ORDER — IRON 325 (65 FE) MG PO TABS: 325.0000 mg | ORAL_TABLET | Freq: Two times a day (BID) | ORAL | Status: DC

## 2013-01-23 ENCOUNTER — Ambulatory Visit: Payer: Medicare HMO | Admitting: Internal Medicine

## 2013-02-26 ENCOUNTER — Encounter: Payer: Self-pay | Admitting: *Deleted

## 2013-02-27 ENCOUNTER — Ambulatory Visit: Payer: Medicare HMO | Admitting: Internal Medicine

## 2013-03-01 ENCOUNTER — Encounter: Payer: Self-pay | Admitting: Internal Medicine

## 2013-03-01 ENCOUNTER — Ambulatory Visit (INDEPENDENT_AMBULATORY_CARE_PROVIDER_SITE_OTHER): Payer: Medicare Other | Admitting: Internal Medicine

## 2013-03-01 VITALS — BP 126/60 | HR 80 | Ht 69.0 in | Wt 196.1 lb

## 2013-03-01 DIAGNOSIS — D509 Iron deficiency anemia, unspecified: Secondary | ICD-10-CM

## 2013-03-01 DIAGNOSIS — D500 Iron deficiency anemia secondary to blood loss (chronic): Secondary | ICD-10-CM

## 2013-03-01 HISTORY — DX: Iron deficiency anemia secondary to blood loss (chronic): D50.0

## 2013-03-01 NOTE — Patient Instructions (Signed)
You have been scheduled for an endoscopy with propofol. Please follow written instructions given to you at your visit today. If you use inhalers (even only as needed), please bring them with you on the day of your procedure.  I appreciate the opportunity to care for you.  

## 2013-03-01 NOTE — Progress Notes (Signed)
         Subjective:    Patient ID: Joseph Golden, male    DOB: [birth year], 68 y.o.   MRN: [id number]  HPI The patient is here to discuss further w/u of iron deficiency anemia. He had a colonoscopy in 2013 and f/u sigmoidoscopy to ensure polyp removal in 2014. No signs of bleeding Is taking iron. Not a blood donor in many years. Does eat iron.  Medications, allergies, past medical history, past surgical history, family history and social history are reviewed and updated in the EMR.   Review of Systems As above    Objective:   Physical Exam NAD    Assessment & Plan:  Anemia, iron deficiency - Plan: Ambulatory referral to Gastroenterology

## 2013-03-01 NOTE — Assessment & Plan Note (Addendum)
Colonoscopy and Flex sig for polyp f/u 2013 and 2014 were without a cause. Plane for EGD to investigate for upper GI source. The risks and benefits as well as alternatives of endoscopic procedure(s) have been discussed and reviewed. All questions answered. The patient agrees to proceed. He remains on iron

## 2013-04-12 ENCOUNTER — Ambulatory Visit (AMBULATORY_SURGERY_CENTER): Payer: Medicare Other | Admitting: Internal Medicine

## 2013-04-12 ENCOUNTER — Encounter: Payer: Self-pay | Admitting: Internal Medicine

## 2013-04-12 VITALS — BP 130/78 | HR 65 | Temp 98.0°F | Resp 14 | Ht 69.0 in | Wt 196.0 lb

## 2013-04-12 DIAGNOSIS — K257 Chronic gastric ulcer without hemorrhage or perforation: Secondary | ICD-10-CM | POA: Diagnosis not present

## 2013-04-12 DIAGNOSIS — K449 Diaphragmatic hernia without obstruction or gangrene: Secondary | ICD-10-CM | POA: Diagnosis not present

## 2013-04-12 DIAGNOSIS — I10 Essential (primary) hypertension: Secondary | ICD-10-CM | POA: Diagnosis not present

## 2013-04-12 DIAGNOSIS — D509 Iron deficiency anemia, unspecified: Secondary | ICD-10-CM

## 2013-04-12 LAB — HELICOBACTER PYLORI SCREEN-BIOPSY: UREASE: POSITIVE — AB

## 2013-04-12 MED ORDER — PANTOPRAZOLE SODIUM 40 MG PO TBEC: 40.0000 mg | DELAYED_RELEASE_TABLET | Freq: Every day | ORAL | Status: DC

## 2013-04-12 MED ORDER — SODIUM CHLORIDE 0.9 % IV SOLN: 500.0000 mL | INTRAVENOUS | Status: DC

## 2013-04-12 NOTE — Progress Notes (Signed)
Procedure ends, to recovery, report given and VSS. 

## 2013-04-12 NOTE — Patient Instructions (Signed)

## 2013-04-12 NOTE — Op Note (Signed)
[facility]  [address]. [place], [address]   ENDOSCOPY PROCEDURE REPORT  PATIENT: Joseph, Golden  MR#: [id number] BIRTHDATE: 02/05/1945 , 59  yrs. old GENDER: Male ENDOSCOPIST: Gatha Mayer, MD, [facility] PROCEDURE DATE:  04/12/2013 PROCEDURE:  EGD w/ biopsy for H.pylori ASA CLASS:     Class II INDICATIONS:  Iron deficiency anemia.  negative colonoscopy 2013 MEDICATIONS: propofol (Diprivan) 150mg  IV, MAC sedation, administered by CRNA, and These medications were titrated to patient response per physician's verbal order TOPICAL ANESTHETIC: none  DESCRIPTION OF PROCEDURE: After the risks benefits and alternatives of the procedure were thoroughly explained, informed consent was obtained.  The LB [id number] endoscope was introduced through the mouth and advanced to the second portion of the duodenum. Without limitations.  The instrument was slowly withdrawn as the mucosa was fully examined.     STOMACH: multiple  erosions were  found in the gastric body at distal aspect of hiatal hernia consistent with Lysbeth Galas erosions/ulcers. .   A 6 cm hiatal hernia was noted. The remainder of the upper endoscopy exam was otherwise normal. CL:O test biopsies taken to look for H. pylori - antrum biopsies. Retroflexed views revealed a hiatal hernia.     The scope was then withdrawn from the patient and the procedure completed.  COMPLICATIONS: There were no complications. ENDOSCOPIC IMPRESSION: 1.   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   6 cm hiatal hernia 3.   The remainder of the upper endoscopy exam was otherwise normal - CLOtest bx taken from antrum to look for H. pykori  RECOMMENDATIONS: Await CLO test results and treat H.  pylori if + Start pantoprazole 40 mg daily Continue ferrous sulfate and follow-up with dr.  Larose Kells re: anemia - unless hernia is  repaired will need routine regular CBC and probably need chronic ferrous sulfate Consider hiatal hernia repair - will discuss  eSigned:  Gatha Mayer, MD, [facility] 04/12/2013 8:34 AM  PI:RJJO Larose Kells, MD and The Patient

## 2013-04-12 NOTE — Progress Notes (Signed)
No complaints noted in the recovery room. Maw   

## 2013-04-16 ENCOUNTER — Telehealth: Payer: Self-pay | Admitting: *Deleted

## 2013-04-16 NOTE — Telephone Encounter (Signed)
  Follow up Call-  Call back number 04/12/2013 11/16/2012 12/30/2011  Post procedure Call Back phone  # [phone]  Permission to leave phone message Yes Yes Yes     Patient questions:  Do you have a fever, pain , or abdominal swelling? no Pain Score  0 *  Have you tolerated food without any problems? yes  Have you been able to return to your normal activities? yes  Do you have any questions about your discharge instructions: Diet   no Medications  no Follow up visit  no  Do you have questions or concerns about your Care? no  Actions: * If pain score is 4 or above: No action needed, pain <4.

## 2013-04-23 ENCOUNTER — Encounter: Payer: Self-pay | Admitting: Internal Medicine

## 2013-04-23 ENCOUNTER — Other Ambulatory Visit: Payer: Self-pay

## 2013-04-23 DIAGNOSIS — K2970 Gastritis, unspecified, without bleeding: Secondary | ICD-10-CM

## 2013-04-23 DIAGNOSIS — B9681 Helicobacter pylori [H. pylori] as the cause of diseases classified elsewhere: Secondary | ICD-10-CM

## 2013-04-23 HISTORY — DX: Helicobacter pylori (H. pylori) as the cause of diseases classified elsewhere: B96.81

## 2013-04-23 MED ORDER — BIS SUBCIT-METRONID-TETRACYC 140-125-125 MG PO CAPS: 3.0000 | ORAL_CAPSULE | Freq: Three times a day (TID) | ORAL | Status: DC

## 2013-04-23 NOTE — Progress Notes (Signed)
Quick Note:  Office to call and let him know he was + for H. Pylori and needs treatment Try for Pylera - if unable to do that then Amoxicillin 1000 mg bid and Biaxin 500 mg bid x 10 days - take Prilosec OTC in PM for 10 days with that (already on pantoprazole daily which he needs long-term)  [facility] - no letter or recall   Note we discussed hiatal hernia repair in recovery at [facility] and he was not interested ______

## 2013-04-27 ENCOUNTER — Telehealth: Payer: Self-pay | Admitting: Internal Medicine

## 2013-04-27 NOTE — Telephone Encounter (Signed)
Spoke to patient and he said that [facility] didn't have the pylera rx.  Called and verbally gave it to [place] in the pharmacy at [place], she ran it thru and they have it in stock. Will be a $40 copay.  Patient informed and will go to pick it up.  Sheri originally sent this in 04/23/13 and [facility] didn't get it, was sent correctly.

## 2013-05-02 ENCOUNTER — Telehealth: Payer: Self-pay | Admitting: Internal Medicine

## 2013-05-02 NOTE — Telephone Encounter (Signed)
No answer

## 2013-05-02 NOTE — Telephone Encounter (Signed)
Patient advised that side effects are typical of Pylera.  He is advised to try a probiotic for the diarrhea.  He will call back for additional questions or cocnerns

## 2013-11-01 ENCOUNTER — Ambulatory Visit (INDEPENDENT_AMBULATORY_CARE_PROVIDER_SITE_OTHER): Payer: Medicare Other | Admitting: Internal Medicine

## 2013-11-01 ENCOUNTER — Encounter: Payer: Self-pay | Admitting: Internal Medicine

## 2013-11-01 VITALS — BP 153/87 | HR 76 | Temp 98.1°F | Ht 70.0 in | Wt 195.4 lb

## 2013-11-01 DIAGNOSIS — E785 Hyperlipidemia, unspecified: Secondary | ICD-10-CM

## 2013-11-01 DIAGNOSIS — Z23 Encounter for immunization: Secondary | ICD-10-CM | POA: Diagnosis not present

## 2013-11-01 DIAGNOSIS — D509 Iron deficiency anemia, unspecified: Secondary | ICD-10-CM | POA: Diagnosis not present

## 2013-11-01 DIAGNOSIS — I10 Essential (primary) hypertension: Secondary | ICD-10-CM

## 2013-11-01 DIAGNOSIS — Z0000 Encounter for general adult medical examination without abnormal findings: Secondary | ICD-10-CM

## 2013-11-01 DIAGNOSIS — Z125 Encounter for screening for malignant neoplasm of prostate: Secondary | ICD-10-CM

## 2013-11-01 LAB — CBC WITH DIFFERENTIAL/PLATELET
BASOS ABS: 0 10*3/uL (ref 0.0–0.1)
Basophils Relative: 0.5 % (ref 0.0–3.0)
Eosinophils Absolute: 0.3 10*3/uL (ref 0.0–0.7)
Eosinophils Relative: 5.6 % — ABNORMAL HIGH (ref 0.0–5.0)
HEMATOCRIT: 44 % (ref 39.0–52.0)
Hemoglobin: 14.5 g/dL (ref 13.0–17.0)
LYMPHS ABS: 0.9 10*3/uL (ref 0.7–4.0)
Lymphocytes Relative: 17.7 % (ref 12.0–46.0)
MCHC: 32.9 g/dL (ref 30.0–36.0)
MCV: 91.1 fl (ref 78.0–100.0)
MONO ABS: 0.5 10*3/uL (ref 0.1–1.0)
Monocytes Relative: 9.2 % (ref 3.0–12.0)
Neutro Abs: 3.4 10*3/uL (ref 1.4–7.7)
Neutrophils Relative %: 67 % (ref 43.0–77.0)
Platelets: 246 10*3/uL (ref 150.0–400.0)
RBC: 4.83 Mil/uL (ref 4.22–5.81)
RDW: 15 % (ref 11.5–15.5)
WBC: 5.1 10*3/uL (ref 4.0–10.5)

## 2013-11-01 MED ORDER — CARVEDILOL 6.25 MG PO TABS: 6.2500 mg | ORAL_TABLET | Freq: Two times a day (BID) | ORAL | Status: DC

## 2013-11-01 NOTE — Progress Notes (Signed)
Subjective:    Patient ID: Joseph Golden, male    DOB: [birth year], 68 y.o.   MRN: [id number]  DOS:  11/01/2013 Type of visit - description :    Here for Medicare AWV:  1. Risk factors based on Past M, S, F history: reviewed  2. Physical Activities: Active , exercises M-F has mini gym at home, active in the yard  3. Depression/mood: neg screening  4. Hearing: No problemss noted or reported  5. ADL's: Independent  6. Fall Risk: no recent fall, see instructions  7. home Safety: does feel safe at home  8. Height, weight, & visual acuity: see VS, saw the ey doctor ~ 04-2013, good reports 9. Counseling: provided  10. Labs ordered based on risk factors: if needed  11. Referral Coordination: if needed  12. Care Plan, see assessment and plan  13. Cognitive Assessment: cognition and motor skills wnl for age  52. Team care updated   We also discussed the following: Hypertension, ambulatory BPs range from 130- 160, mostly in the 630Z and diastolic BP 80. Good compliance of medication Anemia, GI workup reviewed, see assessment and plan. Good compliance with iron OTC   ROS Denies chest pain or difficulty breathing No nausea, vomiting, diarrhea blood in the stools. No cough or sputum production No dysuria, gross hematuria or difficulty urinating. no urinary frequency  Past Medical History  Diagnosis Date  . 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 HTN (hypertension)   . Other and unspecified hyperlipidemia 10/31/2012  . Diverticulosis 11/16/2012  . Allergy     SEASONAL  . 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 Helicobacter pylori gastritis 04/23/2013    Past Surgical History  Procedure Laterality Date  . Colonoscopy    . Sigmoidoscopy      History   Social History  . Marital Status: Married    Spouse Name: N/A    Number of Children: 53  .  Years of Education: N/A   Occupational History  . retired     Social History Main Topics  . Smoking status: Former Smoker    Types: Pipe    Quit date: 01/11/2006  . Smokeless tobacco: Never Used     Comment: years ago, smoked pipe   . Alcohol Use: 1.2 oz/week    2 Cans of beer per week     Comment: socially   . Drug Use: No  . Sexual Activity: Not on file   Other Topics Concern  . Not on file   Social History Narrative   Lives with wife  and 2 daughters     Family History  Problem Relation Age of Onset  . Colon cancer Neg Hx   . Esophageal cancer Neg Hx   . Rectal cancer Neg Hx   . Stomach cancer Neg Hx   . Prostate cancer Neg Hx   . Diabetes Neg Hx   . CAD Neg Hx   . CVA Other     uncle   . Hypertension Mother   . Hypertension Sister   . Hypertension Brother          Medication List       This list is accurate as of: 11/01/13 11:59 PM.  Always use your most recent med list.               amLODipine 10 MG  tablet  Commonly known as:  NORVASC  Take 1 tablet (10 mg total) by mouth daily.     aspirin 81 MG tablet  Take 81 mg by mouth daily.     carvedilol 6.25 MG tablet  Commonly known as:  COREG  Take 1 tablet (6.25 mg total) by mouth 2 (two) times daily with a meal.     Iron 325 (65 FE) MG Tabs  Take by mouth. OTC     losartan-hydrochlorothiazide 50-12.5 MG per tablet  Commonly known as:  HYZAAR  TAKE 1 TABLET BY MOUTH DAILY.           Objective:   Physical Exam BP 153/87  Pulse 76  Temp(Src) 98.1 F (36.7 C) (Oral)  Ht 5\' 10"  (1.778 m)  Wt 195 lb 6 oz (88.622 kg)  BMI 28.03 kg/m2  SpO2 99%  General -- alert, well-developed, NAD.  Neck --no thyromegaly  HEENT-- Not pale.  Lungs -- normal respiratory effort, no intercostal retractions, no accessory muscle use, and normal breath sounds.  Heart-- normal rate, regular rhythm, no murmur.  Abdomen-- Not distended, good bowel sounds,soft, non-tender. Rectal-- No external abnormalities  noted. Normal sphincter tone. No rectal masses or tenderness. Stool brown   Prostate--Prostate gland firm and smooth, slt  Enlargement, no  nodularity, tenderness, mass, asymmetry or induration. Extremities-- no pretibial edema bilaterally  Neurologic--  alert & oriented X3. Speech normal, gait appropriate for age, strength symmetric and appropriate for age.  Psych-- Cognition and judgment appear intact. Cooperative with normal attention span and concentration. No anxious or depressed appearing.     Assessment & Plan:

## 2013-11-01 NOTE — Assessment & Plan Note (Signed)
Check labs 

## 2013-11-01 NOTE — Assessment & Plan Note (Addendum)
GI workup reviewed, on OTC iron  plan: Labs. Also he is taking aspirin 325 daily for cardiovascular protection recommend to decrease to 81 mg if he is to take aspirin daily

## 2013-11-01 NOTE — Patient Instructions (Signed)
Get your blood work before you leave  Start carvedilol  Check the  blood pressure   weekly  Be sure your blood pressure is between   140/85 and 110/65.  if it is consistently higher or lower, let me know    Please come back to the office in 6 months - for a routine check up , no  fasting        Fall Prevention and Home Safety Falls cause injuries and can affect all age groups. It is possible to use preventive measures to significantly decrease the likelihood of falls. There are many simple measures which can make your home safer and prevent falls. OUTDOORS  Repair cracks and edges of walkways and driveways.  Remove high doorway thresholds.  Trim shrubbery on the main path into your home.  Have good outside lighting.  Clear walkways of tools, rocks, debris, and clutter.  Check that handrails are not broken and are securely fastened. Both sides of steps should have handrails.  Have leaves, snow, and ice cleared regularly.  Use sand or salt on walkways during winter months.  In the garage, clean up grease or oil spills. BATHROOM  Install night lights.  Install grab bars by the toilet and in the tub and shower.  Use non-skid mats or decals in the tub or shower.  Place a plastic non-slip stool in the shower to sit on, if needed.  Keep floors dry and clean up all water on the floor immediately.  Remove soap buildup in the tub or shower on a regular basis.  Secure bath mats with non-slip, double-sided rug tape.  Remove throw rugs and tripping hazards from the floors. BEDROOMS  Install night lights.  Make sure a bedside light is easy to reach.  Do not use oversized bedding.  Keep a telephone by your bedside.  Have a firm chair with side arms to use for getting dressed.  Remove throw rugs and tripping hazards from the floor. KITCHEN  Keep handles on pots and pans turned toward the center of the stove. Use back burners when possible.  Clean up spills quickly  and allow time for drying.  Avoid walking on wet floors.  Avoid hot utensils and knives.  Position shelves so they are not too high or low.  Place commonly used objects within easy reach.  If necessary, use a sturdy step stool with a grab bar when reaching.  Keep electrical cables out of the way.  Do not use floor polish or wax that makes floors slippery. If you must use wax, use non-skid floor wax.  Remove throw rugs and tripping hazards from the floor. STAIRWAYS  Never leave objects on stairs.  Place handrails on both sides of stairways and use them. Fix any loose handrails. Make sure handrails on both sides of the stairways are as long as the stairs.  Check carpeting to make sure it is firmly attached along stairs. Make repairs to worn or loose carpet promptly.  Avoid placing throw rugs at the top or bottom of stairways, or properly secure the rug with carpet tape to prevent slippage. Get rid of throw rugs, if possible.  Have an electrician put in a light switch at the top and bottom of the stairs. OTHER FALL PREVENTION TIPS  Wear low-heel or rubber-soled shoes that are supportive and fit well. Wear closed toe shoes.  When using a stepladder, make sure it is fully opened and both spreaders are firmly locked. Do not climb a closed stepladder.  Add color or contrast paint or tape to grab bars and handrails in your home. Place contrasting color strips on first and last steps.  Learn and use mobility aids as needed. Install an electrical emergency response system.  Turn on lights to avoid dark areas. Replace light bulbs that burn out immediately. Get light switches that glow.  Arrange furniture to create clear pathways. Keep furniture in the same place.  Firmly attach carpet with non-skid or double-sided tape.  Eliminate uneven floor surfaces.  Select a carpet pattern that does not visually hide the edge of steps.  Be aware of all pets. OTHER HOME SAFETY TIPS  Set  the water temperature for 120 F (48.8 C).  Keep emergency numbers on or near the telephone.  Keep smoke detectors on every level of the home and near sleeping areas. Document Released: 12/18/2001 Document Revised: 06/29/2011 Document Reviewed: 03/19/2011 [facility] Patient Information 2015 [place], [place]. This information is not intended to replace advice given to you by your health care provider. Make sure you discuss any questions you have with your health care provider.

## 2013-11-01 NOTE — Assessment & Plan Note (Addendum)
Tdap 2013  flu shot today PNM shot today Discussed zostavax-- rx provided before , not ready  12-2011 colonoscopy, had  A large polyp, f/u by GI Flex sigmoidoscopy 11 -14  no residual polyp Next endoscopy per GI DRE negative today except for a slight increase size of the prostate, check a PSA (if a code is available)  Former pipe smoker,   recommend to see a dentist   Diet and exercise discussed. Labs

## 2013-11-01 NOTE — Progress Notes (Signed)
Pre visit review using our clinic review tool, if applicable. No additional management support is needed unless otherwise documented below in the visit note. 

## 2013-11-01 NOTE — Assessment & Plan Note (Signed)
Ambulatory BPs often in the 160s, continue with Hyzaar on amlodipine, add low dose carvedilol, see instructions

## 2013-11-02 LAB — COMPREHENSIVE METABOLIC PANEL
ALT: 21 U/L (ref 0–53)
AST: 23 U/L (ref 0–37)
Albumin: 3.8 g/dL (ref 3.5–5.2)
Alkaline Phosphatase: 56 U/L (ref 39–117)
BILIRUBIN TOTAL: 0.5 mg/dL (ref 0.2–1.2)
BUN: 15 mg/dL (ref 6–23)
CO2: 22 mEq/L (ref 19–32)
Calcium: 9.6 mg/dL (ref 8.4–10.5)
Chloride: 106 mEq/L (ref 96–112)
Creatinine, Ser: 1.1 mg/dL (ref 0.4–1.5)
GFR: 88.85 mL/min (ref >60.00)
Glucose, Bld: 77 mg/dL (ref 70–99)
Potassium: 4.6 mEq/L (ref 3.5–5.1)
SODIUM: 140 meq/L (ref 135–145)
TOTAL PROTEIN: 8.1 g/dL (ref 6.0–8.3)

## 2013-11-02 LAB — IRON: IRON: 79 ug/dL (ref 42–165)

## 2013-11-02 LAB — LIPID PANEL
Cholesterol: 226 mg/dL — ABNORMAL HIGH (ref 0–200)
HDL: 66.3 mg/dL (ref >39.00)
LDL CALC: 139 mg/dL — AB (ref 0–99)
NonHDL: 159.7
Total CHOL/HDL Ratio: 3
Triglycerides: 106 mg/dL (ref 0.0–149.0)
VLDL: 21.2 mg/dL (ref 0.0–40.0)

## 2013-11-02 LAB — FERRITIN: Ferritin: 25.9 ng/mL (ref 22.0–322.0)

## 2013-11-02 LAB — TSH: TSH: 1.01 u[IU]/mL (ref 0.35–4.50)

## 2013-11-05 ENCOUNTER — Encounter: Payer: Self-pay | Admitting: *Deleted

## 2013-11-24 ENCOUNTER — Other Ambulatory Visit: Payer: Self-pay | Admitting: Internal Medicine

## 2014-05-02 ENCOUNTER — Ambulatory Visit (INDEPENDENT_AMBULATORY_CARE_PROVIDER_SITE_OTHER): Payer: Medicare Other | Admitting: Internal Medicine

## 2014-05-02 ENCOUNTER — Encounter: Payer: Self-pay | Admitting: Internal Medicine

## 2014-05-02 VITALS — BP 126/84 | HR 56 | Temp 97.8°F | Ht 70.0 in | Wt 196.5 lb

## 2014-05-02 DIAGNOSIS — I10 Essential (primary) hypertension: Secondary | ICD-10-CM | POA: Diagnosis not present

## 2014-05-02 LAB — BASIC METABOLIC PANEL
BUN: 19 mg/dL (ref 6–23)
CO2: 28 meq/L (ref 19–32)
CREATININE: 0.99 mg/dL (ref 0.40–1.50)
Calcium: 9.2 mg/dL (ref 8.4–10.5)
Chloride: 105 mEq/L (ref 96–112)
GFR: 97.04 mL/min (ref >60.00)
Glucose, Bld: 79 mg/dL (ref 70–99)
Potassium: 4 mEq/L (ref 3.5–5.1)
SODIUM: 138 meq/L (ref 135–145)

## 2014-05-02 MED ORDER — LOSARTAN POTASSIUM-HCTZ 50-12.5 MG PO TABS: 1.0000 | ORAL_TABLET | Freq: Every day | ORAL | Status: DC

## 2014-05-02 MED ORDER — AMLODIPINE BESYLATE 10 MG PO TABS
10.0000 mg | ORAL_TABLET | Freq: Every day | ORAL | Status: DC
Start: 2014-05-02 — End: 2014-11-12

## 2014-05-02 MED ORDER — CARVEDILOL 6.25 MG PO TABS: 6.2500 mg | ORAL_TABLET | Freq: Two times a day (BID) | ORAL | Status: DC

## 2014-05-02 NOTE — Patient Instructions (Signed)
Get your blood work before you leave   Come back to the office in 6 months   for a physical exam  Please schedule an appointment at the front desk    Come back fasting

## 2014-05-02 NOTE — Progress Notes (Signed)
Pre visit review using our clinic review tool, if applicable. No additional management support is needed unless otherwise documented below in the visit note. 

## 2014-05-02 NOTE — Progress Notes (Signed)
Subjective:    Patient ID: Joseph Golden, male    DOB: Jun 20, 1945, 69 y.o.   MRN: [id number]  DOS:  05/02/2014 Type of visit - description : rov Interval history: Since the last time he was here he is doing well. Good compliance of medication, ambulatory BPs always in the 120/80 range. Labs reviewed, due for a BMP Since the last visit he decided to stop taking aspirin due to the potential for GI side effects. Also discontinue iron as the anemia was resolved.    Review of Systems  Denies chest pain or difficulty breathing No nausea, vomiting, diarrhea Past Medical History  Diagnosis Date  . 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 HTN (hypertension)   . Other and unspecified hyperlipidemia 10/31/2012  . Diverticulosis 11/16/2012  . Allergy     SEASONAL  . 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 Helicobacter pylori gastritis 04/23/2013    Past Surgical History  Procedure Laterality Date  . Colonoscopy    . Sigmoidoscopy      History   Social History  . Marital Status: Married    Spouse Name: N/A  . Number of Children: 5  . Years of Education: N/A   Occupational History  . retired     Social History Main Topics  . Smoking status: Former Smoker    Types: Pipe    Quit date: 01/11/2006  . Smokeless tobacco: Never Used     Comment: years ago, smoked pipe   . Alcohol Use: 1.2 oz/week    2 Cans of beer per week     Comment: socially   . Drug Use: No  . Sexual Activity: Not on file   Other Topics Concern  . Not on file   Social History Narrative   Lives with wife  and 2 daughters         Medication List       This list is accurate as of: 05/02/14 11:59 PM.  Always use your most recent med list.               amLODipine 10 MG tablet  Commonly known as:  NORVASC  Take 1 tablet (10 mg total) by mouth daily.     carvedilol 6.25 MG tablet  Commonly known as:  COREG  Take 1 tablet (6.25 mg total) by mouth 2 (two) times daily with a meal.     losartan-hydrochlorothiazide 50-12.5 MG per tablet  Commonly known as:  HYZAAR  Take 1 tablet by mouth daily.           Objective:   Physical Exam BP 126/84 mmHg  Pulse 56  Temp(Src) 97.8 F (36.6 C) (Oral)  Ht 5\' 10"  (1.778 m)  Wt 196 lb 8 oz (89.132 kg)  BMI 28.19 kg/m2  SpO2 98% General:   Well developed, well nourished . NAD.  HEENT:  Normocephalic . Face symmetric, atraumatic Lungs:  CTA B Normal respiratory effort, no intercostal retractions, no accessory muscle use. Heart: RRR,  no murmur.  Muscle skeletal: no pretibial edema bilaterally  Skin: Not pale. Not jaundice Neurologic:  alert & oriented X3.  Speech normal, gait appropriate for age and unassisted Psych--  Cognition and judgment appear intact.  Cooperative with normal attention span and concentration.  Behavior appropriate. No anxious or depressed appearing.       Assessment & Plan:

## 2014-05-02 NOTE — Assessment & Plan Note (Signed)
Good compliance of medication, ambulatory BPs 120/80 on average, will check a BMP, refill medications, follow-up 6 months

## 2014-08-29 ENCOUNTER — Other Ambulatory Visit: Payer: Self-pay

## 2014-08-29 MED ORDER — CARVEDILOL 6.25 MG PO TABS: 6.2500 mg | ORAL_TABLET | Freq: Two times a day (BID) | ORAL | Status: DC

## 2014-11-11 ENCOUNTER — Telehealth: Payer: Self-pay | Admitting: Behavioral Health

## 2014-11-11 NOTE — Telephone Encounter (Signed)
Attempted to reach patient at time of Pre-Visit Call; per the recording this line  does not accept unidentified calls. Unable to leave a message for a call back.

## 2014-11-12 ENCOUNTER — Encounter: Payer: Self-pay | Admitting: Internal Medicine

## 2014-11-12 ENCOUNTER — Ambulatory Visit (INDEPENDENT_AMBULATORY_CARE_PROVIDER_SITE_OTHER): Payer: Medicare Other | Admitting: Internal Medicine

## 2014-11-12 VITALS — BP 116/72 | HR 55 | Temp 98.2°F | Ht 70.0 in | Wt 196.5 lb

## 2014-11-12 DIAGNOSIS — I10 Essential (primary) hypertension: Secondary | ICD-10-CM | POA: Diagnosis not present

## 2014-11-12 DIAGNOSIS — N400 Enlarged prostate without lower urinary tract symptoms: Secondary | ICD-10-CM | POA: Diagnosis not present

## 2014-11-12 DIAGNOSIS — D649 Anemia, unspecified: Secondary | ICD-10-CM | POA: Diagnosis not present

## 2014-11-12 DIAGNOSIS — Z8619 Personal history of other infectious and parasitic diseases: Secondary | ICD-10-CM

## 2014-11-12 DIAGNOSIS — Z0000 Encounter for general adult medical examination without abnormal findings: Secondary | ICD-10-CM | POA: Diagnosis not present

## 2014-11-12 DIAGNOSIS — Z23 Encounter for immunization: Secondary | ICD-10-CM | POA: Diagnosis not present

## 2014-11-12 DIAGNOSIS — Z09 Encounter for follow-up examination after completed treatment for conditions other than malignant neoplasm: Secondary | ICD-10-CM

## 2014-11-12 DIAGNOSIS — Z1159 Encounter for screening for other viral diseases: Secondary | ICD-10-CM

## 2014-11-12 DIAGNOSIS — E785 Hyperlipidemia, unspecified: Secondary | ICD-10-CM

## 2014-11-12 LAB — CBC WITH DIFFERENTIAL/PLATELET
BASOS PCT: 0.5 % (ref 0.0–3.0)
Basophils Absolute: 0 10*3/uL (ref 0.0–0.1)
EOS PCT: 6.6 % — AB (ref 0.0–5.0)
Eosinophils Absolute: 0.4 10*3/uL (ref 0.0–0.7)
HEMATOCRIT: 40.5 % (ref 39.0–52.0)
Hemoglobin: 13.3 g/dL (ref 13.0–17.0)
LYMPHS PCT: 23.2 % (ref 12.0–46.0)
Lymphs Abs: 1.2 10*3/uL (ref 0.7–4.0)
MCHC: 32.9 g/dL (ref 30.0–36.0)
MCV: 89.1 fl (ref 78.0–100.0)
MONOS PCT: 8.9 % (ref 3.0–12.0)
Monocytes Absolute: 0.5 10*3/uL (ref 0.1–1.0)
NEUTROS ABS: 3.2 10*3/uL (ref 1.4–7.7)
Neutrophils Relative %: 60.8 % (ref 43.0–77.0)
PLATELETS: 269 10*3/uL (ref 150.0–400.0)
RBC: 4.55 Mil/uL (ref 4.22–5.81)
RDW: 15.2 % (ref 11.5–15.5)
WBC: 5.3 10*3/uL (ref 4.0–10.5)

## 2014-11-12 LAB — LIPID PANEL
Cholesterol: 201 mg/dL — ABNORMAL HIGH (ref 0–200)
HDL: 51.4 mg/dL (ref >39.00)
LDL Cholesterol: 131 mg/dL — ABNORMAL HIGH (ref 0–99)
NONHDL: 149.17
Total CHOL/HDL Ratio: 4
Triglycerides: 93 mg/dL (ref 0.0–149.0)
VLDL: 18.6 mg/dL (ref 0.0–40.0)

## 2014-11-12 LAB — BASIC METABOLIC PANEL
BUN: 12 mg/dL (ref 6–23)
CALCIUM: 9.6 mg/dL (ref 8.4–10.5)
CO2: 31 mEq/L (ref 19–32)
Chloride: 105 mEq/L (ref 96–112)
Creatinine, Ser: 0.96 mg/dL (ref 0.40–1.50)
GFR: 100.38 mL/min (ref >60.00)
GLUCOSE: 91 mg/dL (ref 70–99)
POTASSIUM: 4.3 meq/L (ref 3.5–5.1)
SODIUM: 141 meq/L (ref 135–145)

## 2014-11-12 LAB — HEPATITIS C ANTIBODY: HCV Ab: NEGATIVE

## 2014-11-12 LAB — FERRITIN: Ferritin: 23.4 ng/mL (ref 22.0–322.0)

## 2014-11-12 LAB — IRON: Iron: 69 ug/dL (ref 42–165)

## 2014-11-12 LAB — PSA: PSA: 2.84 ng/mL (ref 0.10–4.00)

## 2014-11-12 MED ORDER — CARVEDILOL 6.25 MG PO TABS: 6.2500 mg | ORAL_TABLET | Freq: Two times a day (BID) | ORAL | Status: DC

## 2014-11-12 MED ORDER — LOSARTAN POTASSIUM-HCTZ 50-12.5 MG PO TABS: 1.0000 | ORAL_TABLET | Freq: Every day | ORAL | Status: DC

## 2014-11-12 MED ORDER — AMLODIPINE BESYLATE 10 MG PO TABS: 10.0000 mg | ORAL_TABLET | Freq: Every day | ORAL | Status: DC

## 2014-11-12 NOTE — Assessment & Plan Note (Addendum)
Tdap 2013  flu shot-- today PNM shot 2015 prevnar 11-12-14  Discussed zostavax-- rx provided before    12-2011 colonoscopy, had  a large polyp, f/u by GI Flex sigmoidoscopy 11 -14  no residual polyp Next cscope 12-2014 per flex sig report DRE : Slightly enlarged prostate, check a PSA  Former pipe smoker,   recommend to see a dentist   Diet and exercise discussed. Labs

## 2014-11-12 NOTE — Assessment & Plan Note (Addendum)
HTN:   well-controlled Hyperlipidemia, on no medications, check FLP Anemia: Currently not on iron supplements, asymptomatic. Check labs. History of H. pylori gastritis: Checkup breath test to confirm eradication RTC 6 months

## 2014-11-12 NOTE — Patient Instructions (Signed)
Get your blood work before you leave     Check the  blood pressure 2 or 3 times a month  Be sure your blood pressure is between 110/65 and  145/85.  if it is consistently higher or lower, let me know   Next visit  for a   routine visit in 6 months, nonfasting.   (15 minutes) Please schedule an appointment at the front desk '   Fall Prevention and [place] cause injuries and can affect all age groups. It is possible to use preventive measures to significantly decrease the likelihood of falls. There are many simple measures which can make your home safer and prevent falls. OUTDOORS  Repair cracks and edges of walkways and driveways.  Remove high doorway thresholds.  Trim shrubbery on the main path into your home.  Have good outside lighting.  Clear walkways of tools, rocks, debris, and clutter.  Check that handrails are not broken and are securely fastened. Both sides of steps should have handrails.  Have leaves, snow, and ice cleared regularly.  Use sand or salt on walkways during winter months.  In the garage, clean up grease or oil spills. BATHROOM  Install night lights.  Install grab bars by the toilet and in the tub and shower.  Use non-skid mats or decals in the tub or shower.  Place a plastic non-slip stool in the shower to sit on, if needed.  Keep floors dry and clean up all water on the floor immediately.  Remove soap buildup in the tub or shower on a regular basis.  Secure bath mats with non-slip, double-sided rug tape.  Remove throw rugs and tripping hazards from the floors. BEDROOMS  Install night lights.  Make sure a bedside light is easy to reach.  Do not use oversized bedding.  Keep a telephone by your bedside.  Have a firm chair with side arms to use for getting dressed.  Remove throw rugs and tripping hazards from the floor. KITCHEN  Keep handles on pots and pans turned toward the center of the stove. Use back burners when  possible.  Clean up spills quickly and allow time for drying.  Avoid walking on wet floors.  Avoid hot utensils and knives.  Position shelves so they are not too high or low.  Place commonly used objects within easy reach.  If necessary, use a sturdy step stool with a grab bar when reaching.  Keep electrical cables out of the way.  Do not use floor polish or wax that makes floors slippery. If you must use wax, use non-skid floor wax.  Remove throw rugs and tripping hazards from the floor. STAIRWAYS  Never leave objects on stairs.  Place handrails on both sides of stairways and use them. Fix any loose handrails. Make sure handrails on both sides of the stairways are as long as the stairs.  Check carpeting to make sure it is firmly attached along stairs. Make repairs to worn or loose carpet promptly.  Avoid placing throw rugs at the top or bottom of stairways, or properly secure the rug with carpet tape to prevent slippage. Get rid of throw rugs, if possible.  Have an electrician put in a light switch at the top and bottom of the stairs. OTHER FALL PREVENTION TIPS  Wear low-heel or rubber-soled shoes that are supportive and fit well. Wear closed toe shoes.  When using a stepladder, make sure it is fully opened and both spreaders are firmly locked. Do not climb  a closed stepladder.  Add color or contrast paint or tape to grab bars and handrails in your home. Place contrasting color strips on first and last steps.  Learn and use mobility aids as needed. Install an electrical emergency response system.  Turn on lights to avoid dark areas. Replace light bulbs that burn out immediately. Get light switches that glow.  Arrange furniture to create clear pathways. Keep furniture in the same place.  Firmly attach carpet with non-skid or double-sided tape.  Eliminate uneven floor surfaces.  Select a carpet pattern that does not visually hide the edge of steps.  Be aware of all  pets. OTHER HOME SAFETY TIPS  Set the water temperature for 120 F (48.8 C).  Keep emergency numbers on or near the telephone.  Keep smoke detectors on every level of the home and near sleeping areas. Document Released: 12/18/2001 Document Revised: 06/29/2011 Document Reviewed: 03/19/2011 [facility] Patient Information 2015 [place], [place]. This information is not intended to replace advice given to you by your health care provider. Make sure you discuss any questions you have with your health care provider.   Preventive Care for Adults Ages 46 and over  Blood pressure check.** / Every 1 to 2 years.  Lipid and cholesterol check.**/ Every 5 years beginning at age 28.  Lung cancer screening. / Every year if you are aged 3-80 years and have a 30-pack-year history of smoking and currently smoke or have quit within the past 15 years. Yearly screening is stopped once you have quit smoking for at least 15 years or develop a health problem that would prevent you from having lung cancer treatment.  Fecal occult blood test (FOBT) of stool. / Every year beginning at age 6 and continuing until age 65. You may not have to do this test if you get a colonoscopy every 10 years.  Flexible sigmoidoscopy** or colonoscopy.** / Every 5 years for a flexible sigmoidoscopy or every 10 years for a colonoscopy beginning at age 15 and continuing until age 10.  Hepatitis C blood test.** / For all people born from [birth year] through 1963 and any individual with known risks for hepatitis C.  Abdominal aortic aneurysm (AAA) screening.** / A one-time screening for ages 23 to 86 years who are current or former smokers.  Skin self-exam. / Monthly.  Influenza vaccine. / Every year.  Tetanus, diphtheria, and acellular pertussis (Tdap/Td) vaccine.** / 1 dose of Td every 10 years.  Varicella vaccine.** / Consult your health care provider.  Zoster vaccine.** / 1 dose for adults aged 42 years or older.  Pneumococcal  13-valent conjugate (PCV13) vaccine.** / Consult your health care provider.  Pneumococcal polysaccharide (PPSV23) vaccine.** / 1 dose for all adults aged 107 years and older.  Meningococcal vaccine.** / Consult your health care provider.  Hepatitis A vaccine.** / Consult your health care provider.  Hepatitis B vaccine.** / Consult your health care provider.  Haemophilus influenzae type b (Hib) vaccine.** / Consult your health care provider. **Family history and personal history of risk and conditions may change your health care provider's recommendations. Document Released: 02/23/2001 Document Revised: 01/02/2013 Document Reviewed: 05/25/2010 [facility] Patient Information 2015 [place], [place]. This information is not intended to replace advice given to you by your health care provider. Make sure you discuss any questions you have with your health care provider.  '

## 2014-11-12 NOTE — Progress Notes (Signed)
Subjective:    Patient ID: Joseph Golden, male    DOB: 12-07-1945, 69 y.o.   MRN: [id number]  DOS:  11/12/2014 Type of visit - description :   Here for Medicare AWV:   1. Risk factors based on Past M, S, F history: reviewed   2. Physical Activities: still active , active in the yard   3. Depression/mood: neg screening   4. Hearing: No problemss noted or reported   5. ADL's: Independent   6. Fall Risk: no recent fall, see instructions   7. home Safety: does feel safe at home   8. Height, weight, & visual acuity: see VS, due to see the ey doctor, offered referral, declined, states he will call 9. Counseling: provided   10. Labs ordered based on risk factors: if needed   11. Referral Coordination: if needed   12. Care Plan, see assessment and plan   13. Cognitive Assessment: cognition and motor skills wnl for age  62. Team care updated  15. End of life care discussed , MOST info provided   We also discussed the following: HTN: Good compliance with medications, no apparent side effects History of anemia, chart reviewed, due for colonoscopy soon. Review of systems negative. BPH: Asymptomatic    Review of Systems  Constitutional: No fever. No chills. No unexplained wt changes. No unusual sweats  HEENT: No dental problems, no ear discharge, no facial swelling, no voice changes. No eye discharge, no eye  redness , no  intolerance to light   Respiratory: No wheezing , no  difficulty breathing. No cough , no mucus production  Cardiovascular: No CP, no leg swelling , no  Palpitations  GI: no nausea, no vomiting, no diarrhea , no  abdominal pain.  No blood in the stools. No dysphagia, no odynophagia    Endocrine: No polyphagia, no polyuria , no polydipsia  GU: No dysuria, gross hematuria, difficulty urinating. No urinary urgency, no frequency.  Musculoskeletal: No joint swellings or unusual aches or pains  Skin: No change in the color of the skin, palor , no  Rash  Allergic,  immunologic: No environmental allergies , no  food allergies  Neurological: No dizziness no  syncope. No headaches. No diplopia, no slurred, no slurred speech, no motor deficits, no facial  Numbness  Hematological: No enlarged lymph nodes, no easy bruising , no unusual bleedings  Psychiatry: No suicidal ideas, no hallucinations, no beavior problems, no confusion.  No unusual/severe anxiety, no depression   Past Medical History  Diagnosis Date  . 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 HTN (hypertension)   . Other and unspecified hyperlipidemia 10/31/2012  . Diverticulosis 11/16/2012  . Allergy     SEASONAL  . 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 Helicobacter pylori gastritis 04/23/2013    Past Surgical History  Procedure Laterality Date  . Colonoscopy    . Sigmoidoscopy      Social History   Social History  . Marital Status: Married    Spouse Name: N/A  . Number of Children: 5  . Years of Education: N/A   Occupational History  . retired     Social History Main Topics  . Smoking status: Former Smoker    Types: Pipe    Quit date: 01/11/2006  . Smokeless tobacco: Never Used     Comment: quit ~ 2005, smoked pipe   .  Alcohol Use: 1.2 oz/week    2 Cans of beer per week     Comment: socially   . Drug Use: No  . Sexual Activity: Not on file   Other Topics Concern  . Not on file   Social History Narrative   Lives with wife  and 2 daughters      Family History  Problem Relation Age of Onset  . Colon cancer Neg Hx   . Esophageal cancer Neg Hx   . Rectal cancer Neg Hx   . Stomach cancer Neg Hx   . Prostate cancer Neg Hx   . Diabetes Neg Hx   . CAD Neg Hx   . CVA Other     uncle   . Hypertension Mother   . Hypertension Sister   . Hypertension Brother        Medication List       This list is accurate as of: 11/12/14  5:59  PM.  Always use your most recent med list.               amLODipine 10 MG tablet  Commonly known as:  NORVASC  Take 1 tablet (10 mg total) by mouth daily.     carvedilol 6.25 MG tablet  Commonly known as:  COREG  Take 1 tablet (6.25 mg total) by mouth 2 (two) times daily with a meal.     losartan-hydrochlorothiazide 50-12.5 MG tablet  Commonly known as:  HYZAAR  Take 1 tablet by mouth daily.           Objective:   Physical Exam BP 116/72 mmHg  Pulse 55  Temp(Src) 98.2 F (36.8 C) (Oral)  Ht 5\' 10"  (1.778 m)  Wt 196 lb 8 oz (89.132 kg)  BMI 28.19 kg/m2  SpO2 97% General:   Well developed, well nourished . NAD.  HEENT:  Normocephalic . Face symmetric, atraumatic Neck: No thyromegaly Lungs:  CTA B Normal respiratory effort, no intercostal retractions, no accessory muscle use. Heart: RRR,  no murmur.  no pretibial edema bilaterally  Abdomen:  Not distended, soft, non-tender. No rebound or rigidity.  Rectal:  External abnormalities: none. Normal sphincter tone. No rectal masses or tenderness.  Stool brown  Prostate: Prostate gland firm and smooth, moderate enlargement, no nodularity, tenderness, mass, asymmetry or induration.  Skin: Not pale. Not jaundice Neurologic:  alert & oriented X3.  Speech normal, gait appropriate for age and unassisted Psych--  Cognition and judgment appear intact.  Cooperative with normal attention span and concentration.  Behavior appropriate. No anxious or depressed appearing.    Assessment & Plan:   Assessment > HTN Hyperlipidemia BPH: Prostate enlarged by DRE, asx GI: --Colon polyps --Iron deficient anemia, --EGD 4-15 : Gastritis, H. pylori positive: Status post treatment. HH likely causing  C[place]on erosions ---> a  cause for chronic blood loss. Surgery to correct HH? + H. pylori gastritis 5 /2015  Plan: HTN:   well-controlled Hyperlipidemia, on no medications, check FLP Anemia: Currently not on iron supplements,  asymptomatic. Check labs. History of H. pylori gastritis: Checkup breath test to confirm eradication RTC 6 months

## 2014-11-12 NOTE — Progress Notes (Signed)
Pre visit review using our clinic review tool, if applicable. No additional management support is needed unless otherwise documented below in the visit note. 

## 2014-11-13 LAB — H. PYLORI BREATH TEST: H. PYLORI BREATH TEST: NOT DETECTED

## 2014-12-03 ENCOUNTER — Other Ambulatory Visit: Payer: Self-pay

## 2014-12-03 MED ORDER — LOSARTAN POTASSIUM-HCTZ 50-12.5 MG PO TABS: 1.0000 | ORAL_TABLET | Freq: Every day | ORAL | Status: DC

## 2015-01-01 ENCOUNTER — Encounter: Payer: Self-pay | Admitting: Internal Medicine

## 2015-03-26 ENCOUNTER — Other Ambulatory Visit: Payer: Self-pay | Admitting: Internal Medicine

## 2015-05-13 ENCOUNTER — Ambulatory Visit (INDEPENDENT_AMBULATORY_CARE_PROVIDER_SITE_OTHER): Payer: Medicare Other | Admitting: Internal Medicine

## 2015-05-13 ENCOUNTER — Encounter: Payer: Self-pay | Admitting: Internal Medicine

## 2015-05-13 ENCOUNTER — Encounter: Payer: Self-pay | Admitting: *Deleted

## 2015-05-13 VITALS — BP 114/60 | HR 59 | Temp 97.8°F | Ht 70.0 in | Wt 195.0 lb

## 2015-05-13 DIAGNOSIS — D649 Anemia, unspecified: Secondary | ICD-10-CM

## 2015-05-13 DIAGNOSIS — I10 Essential (primary) hypertension: Secondary | ICD-10-CM | POA: Diagnosis not present

## 2015-05-13 LAB — BASIC METABOLIC PANEL
BUN: 12 mg/dL (ref 6–23)
CALCIUM: 9.5 mg/dL (ref 8.4–10.5)
CO2: 28 meq/L (ref 19–32)
CREATININE: 0.94 mg/dL (ref 0.40–1.50)
Chloride: 105 mEq/L (ref 96–112)
GFR: 102.7 mL/min (ref >60.00)
GLUCOSE: 105 mg/dL — AB (ref 70–99)
Potassium: 4 mEq/L (ref 3.5–5.1)
SODIUM: 139 meq/L (ref 135–145)

## 2015-05-13 NOTE — Progress Notes (Signed)
Subjective:    Patient ID: Joseph Golden, male    DOB: [birth year], 70 y.o.   MRN: [id number]  DOS:  05/13/2015 Type of visit - description : Routine office visit Interval history: Good medication compliance, labs reviewed, due for a BMP. BP today is very good Has noted a hard knot 2 the right hand, not painful.   Review of Systems  Denies chest pain or difficulty breathing No nausea, vomiting, diarrhea  Past Medical History  Diagnosis Date  . 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 HTN (hypertension)   . Other and unspecified hyperlipidemia 10/31/2012  . Diverticulosis 11/16/2012  . Allergy     SEASONAL  . 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 Helicobacter pylori gastritis 04/23/2013    Past Surgical History  Procedure Laterality Date  . Colonoscopy    . Sigmoidoscopy      Social History   Social History  . Marital Status: Married    Spouse Name: N/A  . Number of Children: 5  . Years of Education: N/A   Occupational History  . retired     Social History Main Topics  . Smoking status: Former Smoker    Types: Pipe    Quit date: 01/11/2006  . Smokeless tobacco: Never Used     Comment: quit ~ 2005, smoked pipe   . Alcohol Use: 1.2 oz/week    2 Cans of beer per week     Comment: socially   . Drug Use: No  . Sexual Activity: Not on file   Other Topics Concern  . Not on file   Social History Narrative   Lives with wife  and 2 daughters         Medication List       This list is accurate as of: 05/13/15  5:35 PM.  Always use your most recent med list.               amLODipine 10 MG tablet  Commonly known as:  NORVASC  Take 1 tablet (10 mg total) by mouth daily.     carvedilol 6.25 MG tablet  Commonly known as:  COREG  Take 1 tablet (6.25 mg total) by mouth 2 (two) times daily with a meal.     losartan-hydrochlorothiazide 50-12.5 MG tablet  Commonly known as:  HYZAAR  Take 1 tablet by mouth daily.           Objective:   Physical Exam BP 114/60 mmHg  Pulse 59  Temp(Src) 97.8 F (36.6 C) (Oral)  Ht 5\' 10"  (1.778 m)  Wt 195 lb (88.451 kg)  BMI 27.98 kg/m2  SpO2 99% General:   Well developed, well nourished . NAD.  HEENT:  Normocephalic . Face symmetric, atraumatic Lungs:  CTA B Normal respiratory effort, no intercostal retractions, no accessory muscle use. Heart: RRR,  no murmur.  No pretibial edema bilaterally  Skin: Not pale. Not jaundice MSK: Palmar aspect of the right hand has a 5 mm nodule that moves along with the fourth finger tendon. Neurologic:  alert & oriented X3.  Speech normal, gait appropriate for age and unassisted Psych--  Cognition and judgment appear intact.  Cooperative with normal attention span and concentration.  Behavior appropriate. No anxious or depressed appearing.      Assessment & Plan:   Assessment > HTN Hyperlipidemia BPH: Prostate enlarged by DRE,  asx GI: --Colon polyps --Iron deficient anemia, --EGD 4-15 : Gastritis, H. pylori positive: Status post treatment. HH likely causing  Cameron erosions ---> a  cause for chronic blood loss. Surgery to correct HH? + H. pylori gastritis 5 /2015,(-) breath test 11-2014   Plan: HTN: Continue amlodipine, Coreg, Hyzaar. Check a BMP Anemia: Labs CBC excellent. Reassess on RTC History of H. pylori gastritis: Breath test was negative Tendinitis, right hand: Dupuytren's? rec observation. Will call if the area is hurting or gets larger Patient's wife needs a new M.D., she will call and schedule an appointment with me. RTC 6 months, CPXs

## 2015-05-13 NOTE — Assessment & Plan Note (Signed)
HTN: Continue amlodipine, Coreg, Hyzaar. Check a BMP Anemia: Labs CBC excellent. Reassess on RTC History of H. pylori gastritis: Breath test was negative Tendinitis, right hand: Dupuytren's? rec observation. Will call if the area is hurting or gets larger Patient's wife needs a new M.D., she will call and schedule an appointment with me. RTC 6 months, CPXs

## 2015-05-13 NOTE — Progress Notes (Signed)
Pre visit review using our clinic review tool, if applicable. No additional management support is needed unless otherwise documented below in the visit note. 

## 2015-05-13 NOTE — Patient Instructions (Signed)
GO TO THE LAB :      Get the blood work     GO TO THE FRONT DESK Schedule your next appointment for a  Physical by 11-2015, fasting  Please make an appointment for the pt's wife to get established

## 2015-06-21 ENCOUNTER — Other Ambulatory Visit: Payer: Self-pay | Admitting: Internal Medicine

## 2015-11-17 ENCOUNTER — Encounter: Payer: Self-pay | Admitting: Internal Medicine

## 2015-11-17 ENCOUNTER — Ambulatory Visit (INDEPENDENT_AMBULATORY_CARE_PROVIDER_SITE_OTHER): Payer: Medicare Other | Admitting: Internal Medicine

## 2015-11-17 VITALS — BP 124/68 | HR 58 | Temp 98.1°F | Resp 14 | Ht 70.0 in | Wt 195.5 lb

## 2015-11-17 DIAGNOSIS — Z0000 Encounter for general adult medical examination without abnormal findings: Secondary | ICD-10-CM

## 2015-11-17 DIAGNOSIS — E785 Hyperlipidemia, unspecified: Secondary | ICD-10-CM

## 2015-11-17 DIAGNOSIS — I10 Essential (primary) hypertension: Secondary | ICD-10-CM | POA: Diagnosis not present

## 2015-11-17 DIAGNOSIS — Z23 Encounter for immunization: Secondary | ICD-10-CM

## 2015-11-17 DIAGNOSIS — Z1211 Encounter for screening for malignant neoplasm of colon: Secondary | ICD-10-CM

## 2015-11-17 LAB — BASIC METABOLIC PANEL
BUN: 13 mg/dL (ref 6–23)
CHLORIDE: 104 meq/L (ref 96–112)
CO2: 29 meq/L (ref 19–32)
CREATININE: 0.92 mg/dL (ref 0.40–1.50)
Calcium: 9.6 mg/dL (ref 8.4–10.5)
GFR: 105.12 mL/min (ref >60.00)
Glucose, Bld: 87 mg/dL (ref 70–99)
Potassium: 4 mEq/L (ref 3.5–5.1)
Sodium: 141 mEq/L (ref 135–145)

## 2015-11-17 LAB — LIPID PANEL
CHOL/HDL RATIO: 4
CHOLESTEROL: 210 mg/dL — AB (ref 0–200)
HDL: 54.7 mg/dL (ref >39.00)
LDL CALC: 140 mg/dL — AB (ref 0–99)
NonHDL: 155.33
Triglycerides: 76 mg/dL (ref 0.0–149.0)
VLDL: 15.2 mg/dL (ref 0.0–40.0)

## 2015-11-17 LAB — TSH: TSH: 1.02 u[IU]/mL (ref 0.35–4.50)

## 2015-11-17 MED ORDER — LOSARTAN POTASSIUM-HCTZ 50-12.5 MG PO TABS: 1.0000 | ORAL_TABLET | Freq: Every day | ORAL | 3 refills | Status: DC

## 2015-11-17 MED ORDER — CARVEDILOL 6.25 MG PO TABS: 6.2500 mg | ORAL_TABLET | Freq: Two times a day (BID) | ORAL | 3 refills | Status: DC

## 2015-11-17 MED ORDER — AMLODIPINE BESYLATE 10 MG PO TABS: 10.0000 mg | ORAL_TABLET | Freq: Every day | ORAL | 3 refills | Status: DC

## 2015-11-17 NOTE — Progress Notes (Signed)
Pre visit review using our clinic review tool, if applicable. No additional management support is needed unless otherwise documented below in the visit note. 

## 2015-11-17 NOTE — Progress Notes (Signed)
Subjective:    Patient ID: Joseph Golden, male    DOB: July 19, 1945, 70 y.o.   MRN: [id number]  DOS:  11/17/2015 Type of visit - description :   Here for Medicare AWV:   1. Risk factors based on Past M, S, F history: reviewed   2. Physical Activities: still active,  Does yard  work 3. Depression/mood: neg screening   4. Hearing: No problemss noted or reported   5. ADL's: Independent   6. Fall Risk: no recent fall, see instructions   7. home Safety: does feel safe at home   8. Height, weight, & visual acuity: see VS, saw the eye doctor within a year, normal per pt  9. Counseling: provided   10. Labs ordered based on risk factors: if needed   11. Referral Coordination: if needed   12. Care Plan, see assessment and plan   13. Cognitive Assessment: cognition and motor skills wnl for age  23. Team care updated  15. End of life care discussed HC-POA   We also discussed the following: In general feeling well, no major concerns. HTN: Good medication compliance, ambulatory BP on  average 120/74, one time SBP was 97, he was asymptomatic.   Review of Systems  Constitutional: No fever. No chills. No unexplained wt changes. No unusual sweats  HEENT: No dental problems, no ear discharge, no facial swelling, no voice changes. No eye discharge, no eye  redness , no  intolerance to light   Respiratory: No wheezing , no  difficulty breathing. No cough , no mucus production  Cardiovascular: No CP, no leg swelling , no  Palpitations  GI: no nausea, no vomiting, no diarrhea , no  abdominal pain.  No blood in the stools. No dysphagia, no odynophagia    Endocrine: No polyphagia, no polyuria , no polydipsia  GU: No dysuria, gross hematuria, difficulty urinating. No urinary urgency, no frequency.  Musculoskeletal: No joint swellings or unusual aches or pains  Skin: No change in the color of the skin, palor , no  Rash  Allergic, immunologic: No environmental allergies , no  food  allergies  Neurological: No dizziness no  syncope. No headaches. No diplopia, no slurred, no slurred speech, no motor deficits, no facial  Numbness  Hematological: No enlarged lymph nodes, no easy bruising , no unusual bleedings  Psychiatry: No suicidal ideas, no hallucinations, no beavior problems, no confusion.  No unusual/severe anxiety, no depression   Past Medical History:  Diagnosis Date  . Allergy    SEASONAL  . Diverticulosis 11/16/2012  . Helicobacter pylori gastritis 04/23/2013  . HTN (hypertension)   . 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 Other and unspecified hyperlipidemia 10/31/2012  . Personal history of colonic adenoma 12/30/2011   12/2011 - 12 mm adenoma with high-grade dysplasia    Past Surgical History:  Procedure Laterality Date  . NO PAST SURGERIES      Social History   Social History  . Marital status: Married    Spouse name: N/A  . Number of children: 5  . Years of education: N/A   Occupational History  . retired     Social History Main Topics  . Smoking status: Former Smoker    Types: Pipe    Quit date: 01/11/2006  . Smokeless tobacco: Never Used     Comment: quit ~ 2005, smoked pipe   . Alcohol use 1.2 oz/week    2 Cans  of beer per week     Comment: socially   . Drug use: No  . Sexual activity: Not on file   Other Topics Concern  . Not on file   Social History Narrative   Lives with wife  and 2 daughters      Family History  Problem Relation Age of Onset  . Hypertension Mother   . CVA Other     uncle   . Hypertension Sister   . Hypertension Brother   . Colon cancer Neg Hx   . Esophageal cancer Neg Hx   . Rectal cancer Neg Hx   . Stomach cancer Neg Hx   . Prostate cancer Neg Hx   . Diabetes Neg Hx   . CAD Neg Hx       Medication List       Accurate as of 11/17/15 10:29 AM. Always use your most recent med list.          amLODipine 10 MG  tablet Commonly known as:  NORVASC Take 1 tablet (10 mg total) by mouth daily.   carvedilol 6.25 MG tablet Commonly known as:  COREG Take 1 tablet (6.25 mg total) by mouth 2 (two) times daily with a meal.   losartan-hydrochlorothiazide 50-12.5 MG tablet Commonly known as:  HYZAAR Take 1 tablet by mouth daily.          Objective:   Physical Exam BP 124/68 (BP Location: Left Arm, Patient Position: Sitting, Cuff Size: Normal)   Pulse (!) 58   Temp 98.1 F (36.7 C) (Oral)   Resp 14   Ht 5\' 10"  (1.778 m)   Wt 195 lb 8 oz (88.7 kg)   SpO2 98%   BMI 28.05 kg/m   General:   Well developed, well nourished . NAD.  Neck: No  thyromegaly  HEENT:  Normocephalic . Face symmetric, atraumatic. Neck: No thyromegaly, no lymphadenopathies. Lungs:  CTA B Normal respiratory effort, no intercostal retractions, no accessory muscle use. Heart: RRR,  no murmur.  No pretibial edema bilaterally  Abdomen:  Not distended, soft, non-tender. No rebound or rigidity.   Skin: Exposed areas without rash. Not pale. Not jaundice Neurologic:  alert & oriented X3.  Speech normal, gait appropriate for age and unassisted Strength symmetric and appropriate for age.  Psych: Cognition and judgment appear intact.  Cooperative with normal attention span and concentration.  Behavior appropriate. No anxious or depressed appearing.    Assessment & Plan:   Assessment > HTN Hyperlipidemia BPH: Prostate enlarged by DRE, asx GI: --Colon polyps --Iron deficient anemia, --EGD 4-15 : Gastritis, H. pylori positive: Status post treatment. HH likely causing  Cameron erosions ---> a  cause for chronic blood loss. Surgery to correct HH? + H. pylori gastritis 5 /2015,(-) breath test 11-2014   PLAN HTN: Continue carvedilol, losartan, amlodipine. BP well-controlled, to call if he has consistent or symptomatic episodes of low blood pressure. Check a BMP and TSH Hyperlipidemia: Diet control, check a FLP RTC one  year

## 2015-11-17 NOTE — Assessment & Plan Note (Addendum)
Tdap 2013; PNM shot 2015;  prevnar 11-12-14 ;   zostavax-- rx provided before   CCS: 12-2011 colonoscopy, had  a large polyp, f/u by GI Flex sigmoidoscopy 11 -14  no residual polyp Next cscope was due 12-2014 per flex sig report: Refer to GI  Prostate ca screening: DRE done 2016, PSA stable  Diet and exercise discussed. Labs: FLP, BMP, TSH

## 2015-11-17 NOTE — Patient Instructions (Signed)
Get your blood work before you leave    Think about getting her healthcare power of attorney   Check the  blood pressure 2 or 3 times a month  Be sure your blood pressure is between 110/65 and  145/85.  if it is consistently higher or lower, let me know     Next visit in one year    Fall Prevention and [place] cause injuries and can affect all age groups. It is possible to use preventive measures to significantly decrease the likelihood of falls. There are many simple measures which can make your home safer and prevent falls. OUTDOORS  Repair cracks and edges of walkways and driveways.  Remove high doorway thresholds.  Trim shrubbery on the main path into your home.  Have good outside lighting.  Clear walkways of tools, rocks, debris, and clutter.  Check that handrails are not broken and are securely fastened. Both sides of steps should have handrails.  Have leaves, snow, and ice cleared regularly.  Use sand or salt on walkways during winter months.  In the garage, clean up grease or oil spills. BATHROOM  Install night lights.  Install grab bars by the toilet and in the tub and shower.  Use non-skid mats or decals in the tub or shower.  Place a plastic non-slip stool in the shower to sit on, if needed.  Keep floors dry and clean up all water on the floor immediately.  Remove soap buildup in the tub or shower on a regular basis.  Secure bath mats with non-slip, double-sided rug tape.  Remove throw rugs and tripping hazards from the floors. BEDROOMS  Install night lights.  Make sure a bedside light is easy to reach.  Do not use oversized bedding.  Keep a telephone by your bedside.  Have a firm chair with side arms to use for getting dressed.  Remove throw rugs and tripping hazards from the floor. KITCHEN  Keep handles on pots and pans turned toward the center of the stove. Use back burners when possible.  Clean up spills quickly and allow  time for drying.  Avoid walking on wet floors.  Avoid hot utensils and knives.  Position shelves so they are not too high or low.  Place commonly used objects within easy reach.  If necessary, use a sturdy step stool with a grab bar when reaching.  Keep electrical cables out of the way.  Do not use floor polish or wax that makes floors slippery. If you must use wax, use non-skid floor wax.  Remove throw rugs and tripping hazards from the floor. STAIRWAYS  Never leave objects on stairs.  Place handrails on both sides of stairways and use them. Fix any loose handrails. Make sure handrails on both sides of the stairways are as long as the stairs.  Check carpeting to make sure it is firmly attached along stairs. Make repairs to worn or loose carpet promptly.  Avoid placing throw rugs at the top or bottom of stairways, or properly secure the rug with carpet tape to prevent slippage. Get rid of throw rugs, if possible.  Have an electrician put in a light switch at the top and bottom of the stairs. OTHER FALL PREVENTION TIPS  Wear low-heel or rubber-soled shoes that are supportive and fit well. Wear closed toe shoes.  When using a stepladder, make sure it is fully opened and both spreaders are firmly locked. Do not climb a closed stepladder.  Add color or contrast paint  or tape to grab bars and handrails in your home. Place contrasting color strips on first and last steps.  Learn and use mobility aids as needed. Install an electrical emergency response system.  Turn on lights to avoid dark areas. Replace light bulbs that burn out immediately. Get light switches that glow.  Arrange furniture to create clear pathways. Keep furniture in the same place.  Firmly attach carpet with non-skid or double-sided tape.  Eliminate uneven floor surfaces.  Select a carpet pattern that does not visually hide the edge of steps.  Be aware of all pets. OTHER HOME SAFETY TIPS  Set the water  temperature for 120 F (48.8 C).  Keep emergency numbers on or near the telephone.  Keep smoke detectors on every level of the home and near sleeping areas. Document Released: 12/18/2001 Document Revised: 06/29/2011 Document Reviewed: 03/19/2011 [facility] Patient Information 2015 [place], [place]. This information is not intended to replace advice given to you by your health care provider. Make sure you discuss any questions you have with your health care provider.   Preventive Care for Adults Ages 84 and over  Blood pressure check.** / Every 1 to 2 years.  Lipid and cholesterol check.**/ Every 5 years beginning at age 53.  Lung cancer screening. / Every year if you are aged 8-80 years and have a 30-pack-year history of smoking and currently smoke or have quit within the past 15 years. Yearly screening is stopped once you have quit smoking for at least 15 years or develop a health problem that would prevent you from having lung cancer treatment.  Fecal occult blood test (FOBT) of stool. / Every year beginning at age 30 and continuing until age 108. You may not have to do this test if you get a colonoscopy every 10 years.  Flexible sigmoidoscopy** or colonoscopy.** / Every 5 years for a flexible sigmoidoscopy or every 10 years for a colonoscopy beginning at age 59 and continuing until age 28.  Hepatitis C blood test.** / For all people born from [birth year] through 1963 and any individual with known risks for hepatitis C.  Abdominal aortic aneurysm (AAA) screening.** / A one-time screening for ages 16 to 9 years who are current or former smokers.  Skin self-exam. / Monthly.  Influenza vaccine. / Every year.  Tetanus, diphtheria, and acellular pertussis (Tdap/Td) vaccine.** / 1 dose of Td every 10 years.  Varicella vaccine.** / Consult your health care provider.  Zoster vaccine.** / 1 dose for adults aged 60 years or older.  Pneumococcal 13-valent conjugate (PCV13) vaccine.** / Consult  your health care provider.  Pneumococcal polysaccharide (PPSV23) vaccine.** / 1 dose for all adults aged 42 years and older.  Meningococcal vaccine.** / Consult your health care provider.  Hepatitis A vaccine.** / Consult your health care provider.  Hepatitis B vaccine.** / Consult your health care provider.  Haemophilus influenzae type b (Hib) vaccine.** / Consult your health care provider. **Family history and personal history of risk and conditions may change your health care provider's recommendations. Document Released: 02/23/2001 Document Revised: 01/02/2013 Document Reviewed: 05/25/2010 [facility] Patient Information 2015 [place], [place]. This information is not intended to replace advice given to you by your health care provider. Make sure you discuss any questions you have with your health care provider.

## 2015-11-18 NOTE — Assessment & Plan Note (Signed)
HTN: Continue carvedilol, losartan, amlodipine. BP well-controlled, to call if he has consistent or symptomatic episodes of low blood pressure. Check a BMP and TSH Hyperlipidemia: Diet control, check a FLP RTC one year

## 2015-11-23 ENCOUNTER — Other Ambulatory Visit: Payer: Self-pay | Admitting: Internal Medicine

## 2015-12-17 ENCOUNTER — Encounter: Payer: Self-pay | Admitting: Internal Medicine

## 2015-12-18 ENCOUNTER — Other Ambulatory Visit: Payer: Self-pay

## 2016-11-18 ENCOUNTER — Ambulatory Visit (INDEPENDENT_AMBULATORY_CARE_PROVIDER_SITE_OTHER): Payer: Medicare Other | Admitting: Internal Medicine

## 2016-11-18 ENCOUNTER — Encounter: Payer: Self-pay | Admitting: Internal Medicine

## 2016-11-18 VITALS — BP 128/72 | HR 76 | Temp 97.6°F | Resp 14 | Ht 70.0 in | Wt 192.2 lb

## 2016-11-18 DIAGNOSIS — E785 Hyperlipidemia, unspecified: Secondary | ICD-10-CM

## 2016-11-18 DIAGNOSIS — Z0000 Encounter for general adult medical examination without abnormal findings: Secondary | ICD-10-CM

## 2016-11-18 DIAGNOSIS — Z23 Encounter for immunization: Secondary | ICD-10-CM | POA: Diagnosis not present

## 2016-11-18 DIAGNOSIS — R972 Elevated prostate specific antigen [PSA]: Secondary | ICD-10-CM

## 2016-11-18 DIAGNOSIS — Z1211 Encounter for screening for malignant neoplasm of colon: Secondary | ICD-10-CM

## 2016-11-18 LAB — CBC WITH DIFFERENTIAL/PLATELET
Basophils Absolute: 0 10*3/uL (ref 0.0–0.1)
Basophils Relative: 0.3 % (ref 0.0–3.0)
EOS PCT: 9.1 % — AB (ref 0.0–5.0)
Eosinophils Absolute: 0.5 10*3/uL (ref 0.0–0.7)
HCT: 44.2 % (ref 39.0–52.0)
HEMOGLOBIN: 14.6 g/dL (ref 13.0–17.0)
LYMPHS ABS: 1.1 10*3/uL (ref 0.7–4.0)
Lymphocytes Relative: 18.9 % (ref 12.0–46.0)
MCHC: 33 g/dL (ref 30.0–36.0)
MCV: 91.9 fl (ref 78.0–100.0)
MONO ABS: 0.5 10*3/uL (ref 0.1–1.0)
Monocytes Relative: 8.4 % (ref 3.0–12.0)
NEUTROS ABS: 3.6 10*3/uL (ref 1.4–7.7)
NEUTROS PCT: 63.3 % (ref 43.0–77.0)
PLATELETS: 228 10*3/uL (ref 150.0–400.0)
RBC: 4.81 Mil/uL (ref 4.22–5.81)
RDW: 15.3 % (ref 11.5–15.5)
WBC: 5.6 10*3/uL (ref 4.0–10.5)

## 2016-11-18 LAB — COMPREHENSIVE METABOLIC PANEL
ALBUMIN: 4.2 g/dL (ref 3.5–5.2)
ALK PHOS: 57 U/L (ref 39–117)
ALT: 11 U/L (ref 0–53)
AST: 14 U/L (ref 0–37)
BUN: 13 mg/dL (ref 6–23)
CHLORIDE: 105 meq/L (ref 96–112)
CO2: 25 mEq/L (ref 19–32)
Calcium: 9.7 mg/dL (ref 8.4–10.5)
Creatinine, Ser: 0.97 mg/dL (ref 0.40–1.50)
GFR: 98.6 mL/min (ref >60.00)
Glucose, Bld: 91 mg/dL (ref 70–99)
POTASSIUM: 3.9 meq/L (ref 3.5–5.1)
Sodium: 140 mEq/L (ref 135–145)
TOTAL PROTEIN: 7.6 g/dL (ref 6.0–8.3)
Total Bilirubin: 0.5 mg/dL (ref 0.2–1.2)

## 2016-11-18 LAB — LIPID PANEL
CHOLESTEROL: 223 mg/dL — AB (ref 0–200)
HDL: 56.4 mg/dL (ref >39.00)
LDL Cholesterol: 151 mg/dL — ABNORMAL HIGH (ref 0–99)
NonHDL: 166.12
TRIGLYCERIDES: 74 mg/dL (ref 0.0–149.0)
Total CHOL/HDL Ratio: 4
VLDL: 14.8 mg/dL (ref 0.0–40.0)

## 2016-11-18 LAB — PSA: PSA: 8.77 ng/mL — AB (ref 0.10–4.00)

## 2016-11-18 NOTE — Assessment & Plan Note (Signed)
HTN: Well-controlled with current meds, check labs Hyperlipidemia: Diet controlled BPH: Minimal prostate enlargement on exam, asx RTC 1 year

## 2016-11-18 NOTE — Assessment & Plan Note (Addendum)
-  Tdap 2013; PNM shot 2015;  prevnar 11-12-14 ; shingrix discussed ; flu shot today  -CCS: 12-2011 colonoscopy, had  a large polyp, f/u by GI Flex sigmoidoscopy 11 -14  no residual polyp Next cscope was due 12-2014 per flex sig report.  Rectal polyp noted on DRE today, pt aware. GI referral last year failed, try again -Prostate ca screening: DRE done today neg, check a PSA   -Diet and exercise discussed. He actually does well, walks 2 miles qd  - former pipe smoker, rec to see a dentist regularly  -Labs: CMP FLP CBC PSA

## 2016-11-18 NOTE — Progress Notes (Signed)
Pre visit review using our clinic review tool, if applicable. No additional management support is needed unless otherwise documented below in the visit note. 

## 2016-11-18 NOTE — Progress Notes (Signed)
Subjective:    Patient ID: Joseph Golden, male    DOB: 1945/10/25, 71 y.o.   MRN: [id number]  DOS:  11/18/2016 Type of visit - description : cpx Interval history: No major concerns Good compliance with medication, ambulatory BPs in the 120/70.  Review of Systems   A 14 point review of systems is negative     Past Medical History:  Diagnosis Date  . Allergy    SEASONAL  . Diverticulosis 11/16/2012  . Helicobacter pylori gastritis 04/23/2013  . HTN (hypertension)   . 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 Other and unspecified hyperlipidemia 10/31/2012  . Personal history of colonic adenoma 12/30/2011   12/2011 - 12 mm adenoma with high-grade dysplasia    Past Surgical History:  Procedure Laterality Date  . NO PAST SURGERIES      Social History   Socioeconomic History  . Marital status: Married    Spouse name: Not on file  . Number of children: 5  . Years of education: Not on file  . Highest education level: Not on file  Social Needs  . Financial resource strain: Not on file  . Food insecurity - worry: Not on file  . Food insecurity - inability: Not on file  . Transportation needs - medical: Not on file  . Transportation needs - non-medical: Not on file  Occupational History  . Occupation: retired   Tobacco Use  . Smoking status: Former Smoker    Types: Pipe    Last attempt to quit: 01/11/2006    Years since quitting: 10.8  . Smokeless tobacco: Never Used  . Tobacco comment: quit ~ 2005, smoked pipe   Substance and Sexual Activity  . Alcohol use: Yes    Alcohol/week: 1.2 oz    Types: 2 Cans of beer per week    Comment: socially   . Drug use: No  . Sexual activity: Not on file  Other Topics Concern  . Not on file  Social History Narrative   Lives with wife  and 2 daughters      Family History  Problem Relation Age of Onset  . Hypertension Mother   . CVA Other        uncle   .  Hypertension Sister   . Hypertension Brother   . Colon cancer Neg Hx   . Esophageal cancer Neg Hx   . Rectal cancer Neg Hx   . Stomach cancer Neg Hx   . Prostate cancer Neg Hx   . Diabetes Neg Hx   . CAD Neg Hx      Allergies as of 11/18/2016   No Known Allergies     Medication List        Accurate as of 11/18/16 12:44 PM. Always use your most recent med list.          amLODipine 10 MG tablet Commonly known as:  NORVASC Take 1 tablet (10 mg total) by mouth daily.   carvedilol 6.25 MG tablet Commonly known as:  COREG Take 1 tablet (6.25 mg total) by mouth 2 (two) times daily with a meal.   losartan-hydrochlorothiazide 50-12.5 MG tablet Commonly known as:  HYZAAR Take 1 tablet by mouth daily.          Objective:   Physical Exam BP 128/72 (BP Location: Left Arm, Patient Position: Sitting, Cuff Size: Normal)   Pulse 76   Temp 97.6 F (36.4 C) (Oral)  Resp 14   Ht 5\' 10"  (1.778 m)   Wt 192 lb 4 oz (87.2 kg)   SpO2 97%   BMI 27.59 kg/m  General:   Well developed, well nourished . NAD.  Neck: No  thyromegaly , nl carotid pulses HEENT:  Normocephalic . Face symmetric, atraumatic Lungs:  CTA B Normal respiratory effort, no intercostal retractions, no accessory muscle use. Heart: RRR,  no murmur.  No pretibial edema bilaterally  Abdomen:  Not distended, soft, non-tender. No rebound or rigidity.   Rectal:  External abnormalities: none. Normal sphincter tone. No stools, has a pedunculated polyp ~ 20mm (?) on the L rectum Prostate: Prostate gland firm and smooth, minimal enlargement, no nodularity, tenderness, mass, asymmetry or induration.  Skin: Exposed areas without rash. Not pale. Not jaundice Neurologic:  alert & oriented X3.  Speech normal, gait appropriate for age and unassisted Strength symmetric and appropriate for age.  Psych: Cognition and judgment appear intact.  Cooperative with normal attention span and concentration.  Behavior  appropriate. No anxious or depressed appearing.     Assessment & Plan:   Assessment  HTN Hyperlipidemia BPH: Prostate enlarged by DRE, asx GI: --Colon polyps --Iron deficient anemia, --EGD 4-15 : Gastritis, H. pylori positive: Status post treatment. HH likely causing  Cameron erosions ---> a  cause for chronic blood loss. Surgery to correct HH? + H. pylori gastritis 5 /2015,(-) breath test 11-2014   PLAN HTN: Well-controlled with current meds, check labs Hyperlipidemia: Diet controlled BPH: Minimal prostate enlargement on exam, asx RTC 1 year

## 2016-11-18 NOTE — Patient Instructions (Addendum)
GO TO THE LAB : Get the blood work     GO TO THE FRONT DESK Schedule your next appointment for a physical exam in 1 year, fasting  You have a polyp near in the rectum, it is important that you have a colonoscopy  Please see your dentist   every 6 months    Check the  blood pressure   monthly   Be sure your blood pressure is between 110/65 and  135/85. If it is consistently higher or lower, let me know

## 2016-11-22 NOTE — Addendum Note (Signed)
Addended byDamita Dunnings D on: 11/22/2016 01:20 PM   Modules accepted: Orders

## 2016-11-26 ENCOUNTER — Other Ambulatory Visit: Payer: Self-pay | Admitting: Internal Medicine

## 2016-12-17 ENCOUNTER — Other Ambulatory Visit: Payer: Self-pay | Admitting: Internal Medicine

## 2016-12-22 ENCOUNTER — Other Ambulatory Visit: Payer: Self-pay | Admitting: Internal Medicine

## 2016-12-22 ENCOUNTER — Encounter: Payer: Self-pay | Admitting: Internal Medicine

## 2017-02-11 DIAGNOSIS — C7951 Secondary malignant neoplasm of bone: Secondary | ICD-10-CM

## 2017-02-11 DIAGNOSIS — C61 Malignant neoplasm of prostate: Secondary | ICD-10-CM

## 2017-02-11 HISTORY — DX: Malignant neoplasm of prostate: C79.51

## 2017-02-11 HISTORY — DX: Malignant neoplasm of prostate: C61

## 2017-02-17 ENCOUNTER — Encounter: Payer: Self-pay | Admitting: Radiation Oncology

## 2017-02-28 DIAGNOSIS — C61 Malignant neoplasm of prostate: Secondary | ICD-10-CM | POA: Insufficient documentation

## 2017-03-04 ENCOUNTER — Encounter: Payer: Self-pay | Admitting: Radiation Oncology

## 2017-03-04 NOTE — Progress Notes (Signed)
GU Location of Tumor / Histology: prostatic adenocarcinoma  If Prostate Cancer, Gleason Score is (4 + 3) and PSA is (8.13). Prostate volume: 35 cc  Joseph Golden was referred by his PCP, Dr. Kathlene November, in December 2018 for further evaluation of an elevated PSA.   Biopsies of prostate (if applicable) revealed:    Past/Anticipated interventions by urology, if any: prostate biopsy  Past/Anticipated interventions by medical oncology, if any: no  Weight changes, if any: yes, -10 lb x 1 month; reports he has been very active at the church lately.   Bowel/Bladder complaints, if any: IPSS 1 with nocturia. Denies dysuria, hematuria, leakage or incontinence.   Nausea/Vomiting, if any: no  Pain issues, if any:  Reports left hip pain x 4-5 years. Reports pain is unchanged and flares with increased activity.   SAFETY ISSUES:  Prior radiation? no  Pacemaker/ICD? no  Possible current pregnancy? no  Is the patient on methotrexate? no  Current Complaints / other details:  72 year old male. Married with one son and four daughters. Married x 46 years.

## 2017-03-07 ENCOUNTER — Encounter: Payer: Self-pay | Admitting: Medical Oncology

## 2017-03-07 ENCOUNTER — Ambulatory Visit
Admission: RE | Admit: 2017-03-07 | Discharge: 2017-03-07 | Disposition: A | Discharge status: Home / Self Care | Payer: Medicare Other | Source: Ambulatory Visit | Attending: Radiation Oncology | Admitting: Radiation Oncology

## 2017-03-07 ENCOUNTER — Other Ambulatory Visit: Payer: Self-pay

## 2017-03-07 ENCOUNTER — Encounter: Payer: Self-pay | Admitting: Radiation Oncology

## 2017-03-07 VITALS — BP 128/82 | HR 61 | Temp 97.9°F | Resp 18 | Ht 70.5 in | Wt 186.2 lb

## 2017-03-07 DIAGNOSIS — Z803 Family history of malignant neoplasm of breast: Secondary | ICD-10-CM | POA: Insufficient documentation

## 2017-03-07 DIAGNOSIS — Z79899 Other long term (current) drug therapy: Secondary | ICD-10-CM | POA: Diagnosis not present

## 2017-03-07 DIAGNOSIS — Z8719 Personal history of other diseases of the digestive system: Secondary | ICD-10-CM | POA: Insufficient documentation

## 2017-03-07 DIAGNOSIS — D500 Iron deficiency anemia secondary to blood loss (chronic): Secondary | ICD-10-CM | POA: Insufficient documentation

## 2017-03-07 DIAGNOSIS — Z808 Family history of malignant neoplasm of other organs or systems: Secondary | ICD-10-CM | POA: Insufficient documentation

## 2017-03-07 DIAGNOSIS — K449 Diaphragmatic hernia without obstruction or gangrene: Secondary | ICD-10-CM | POA: Diagnosis not present

## 2017-03-07 DIAGNOSIS — Z87891 Personal history of nicotine dependence: Secondary | ICD-10-CM | POA: Diagnosis not present

## 2017-03-07 DIAGNOSIS — E785 Hyperlipidemia, unspecified: Secondary | ICD-10-CM | POA: Diagnosis not present

## 2017-03-07 DIAGNOSIS — I10 Essential (primary) hypertension: Secondary | ICD-10-CM | POA: Insufficient documentation

## 2017-03-07 DIAGNOSIS — Z86010 Personal history of colonic polyps: Secondary | ICD-10-CM | POA: Insufficient documentation

## 2017-03-07 DIAGNOSIS — C61 Malignant neoplasm of prostate: Secondary | ICD-10-CM | POA: Diagnosis present

## 2017-03-07 NOTE — Progress Notes (Signed)
See progress note under physician encounter. 

## 2017-03-07 NOTE — Progress Notes (Signed)
Introduced myself to Joseph Golden and family as the prostate nurse navigator and my role. He states he is not sure what treatment he will pursue. I encouraged him to learn about all of his options so he can make an informed decision. I gave him my business card and asked him to call me with questions or concerns. He voiced understanding.

## 2017-03-07 NOTE — Progress Notes (Signed)
Radiation Oncology         [phone] ________________________________  Initial Outpatient Consultation  Name: Joseph Golden MRN: [id number]  Date: 03/07/2017  DOB: 11/02/1945  CC:Paz, Alda Berthold, MD  Lucas Mallow, MD   REFERRING PHYSICIAN: Lucas Mallow, MD  DIAGNOSIS: 72 y.o. gentleman with Intermittent Risk, Stage T1c adenocarcinoma of the prostate with Gleason Score of 4+3, and PSA of 8.13    ICD-10-CM   1. Prostate cancer [facility]) C61     HISTORY OF PRESENT ILLNESS: Joseph Golden is a 71 y.o. male with a diagnosis of prostate cancer. He was noted to have an elevated PSA of 8.77 in November 2018 by his primary care physician, Dr. Kathlene November.  Accordingly, he was referred for evaluation in urology to Dr. Gloriann Loan on 01/07/2017, where a digital rectal examination was performed at that time revealing no prostate nodularity. His PSA was rechecked and remained elevated at 8.13.  The patient proceeded to transrectal ultrasound with 12 biopsies of the prostate on 02/03/2017.  The prostate volume measured 35.16 cc.  Out of 12 core biopsies, 7 were positive.  The maximum Gleason score was 4+3, and this was seen in left base lateral, left mid lateral, left mid, right base, and right base lateral.  The patient reviewed the biopsy results with his urologist and he has kindly been referred today for discussion of potential radiation treatment options. He is accompanied by his wife and daughter.    PREVIOUS RADIATION THERAPY: No  PAST MEDICAL HISTORY:  Past Medical History:  Diagnosis Date  . Allergy    SEASONAL  . Diverticulosis 11/16/2012  . Helicobacter pylori gastritis 04/23/2013  . HTN (hypertension)   . 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 Other and unspecified hyperlipidemia 10/31/2012  . Personal history of colonic adenoma 12/30/2011   12/2011 - 12 mm adenoma with high-grade dysplasia  . Prostate cancer ([place])  02/2017      PAST SURGICAL HISTORY: Past Surgical History:  Procedure Laterality Date  . COLONOSCOPY  2013  . NO PAST SURGERIES    . PROSTATE BIOPSY      FAMILY HISTORY:  Family History  Problem Relation Age of Onset  . Hypertension Mother   . Cancer Mother        breast  . Cancer Father 24       brain  . CVA Other        uncle   . Cancer Other        stomach  . Hypertension Sister   . Hypertension Brother   . Colon cancer Neg Hx   . Esophageal cancer Neg Hx   . Rectal cancer Neg Hx   . Stomach cancer Neg Hx   . Prostate cancer Neg Hx   . Diabetes Neg Hx   . CAD Neg Hx     SOCIAL HISTORY:  Social History   Socioeconomic History  . Marital status: Married    Spouse name: Not on file  . Number of children: 5  . Years of education: Not on file  . Highest education level: Not on file  Social Needs  . Financial resource strain: Not on file  . Food insecurity - worry: Not on file  . Food insecurity - inability: Not on file  . Transportation needs - medical: Not on file  . Transportation needs - non-medical: Not on file  Occupational History  . Occupation:  retired   Tobacco Use  . Smoking status: Former Smoker    Types: Pipe    Last attempt to quit: 01/11/2006    Years since quitting: 11.1  . Smokeless tobacco: Never Used  . Tobacco comment: quit ~ 2005, smoked pipe   Substance and Sexual Activity  . Alcohol use: Yes    Alcohol/week: 1.2 oz    Types: 2 Cans of beer per week    Comment: socially   . Drug use: No  . Sexual activity: No  Other Topics Concern  . Not on file  Social History Narrative   Married for 46 years. Lives in [place]. One son and four daughters.  The patient is retired from working in [employer]. He graduated from [employer].   ALLERGIES: Patient has no known allergies.  MEDICATIONS:  Current Outpatient Medications  Medication Sig Dispense Refill  . amLODipine (NORVASC) 10 MG tablet Take 1 tablet (10 mg total) by mouth daily.  90 tablet 3  . carvedilol (COREG) 6.25 MG tablet Take 1 tablet (6.25 mg total) 2 (two) times daily with a meal by mouth. 180 tablet 3  . losartan-hydrochlorothiazide (HYZAAR) 50-12.5 MG tablet Take 1 tablet by mouth daily. 90 tablet 3   No current facility-administered medications for this encounter.     REVIEW OF SYSTEMS:  On review of systems, the patient reports that he is doing well overall. He denies any chest pain, shortness of breath, cough, fevers, chills, or night sweats. He reports a 10-pound unintended weight loss in the past month; reports he has been very active at church lately. He denies any bowel disturbances, and denies abdominal pain, nausea or vomiting. He reports left hip pain over the past 4-5 years; reports the pain is unchanged and flares with increased activity. His IPSS was 1, indicating mild urinary symptoms with nocturia. He denies any dysuria, hematuria, leakage or incontinence. He is infrequently able to complete sexual activity with most attempts. A complete review of systems is obtained and is otherwise negative.    PHYSICAL EXAM:  Wt Readings from Last 3 Encounters:  03/07/17 186 lb 3.2 oz (84.5 kg)  11/18/16 192 lb 4 oz (87.2 kg)  11/17/15 195 lb 8 oz (88.7 kg)   Temp Readings from Last 3 Encounters:  03/07/17 97.9 F (36.6 C) (Oral)  11/18/16 97.6 F (36.4 C) (Oral)  11/17/15 98.1 F (36.7 C) (Oral)   BP Readings from Last 3 Encounters:  03/07/17 128/82  11/18/16 128/72  11/17/15 124/68   Pulse Readings from Last 3 Encounters:  03/07/17 61  11/18/16 76  11/17/15 (!) 58   Pain Assessment Pain Score: 0-No pain/10  In general this is a well appearing African-American male in no acute distress. He is alert and oriented x4 and appropriate throughout the examination. HEENT reveals that the patient is normocephalic, atraumatic. EOMs are intact. PERRLA. Skin is intact without any evidence of gross lesions. Cardiovascular exam reveals a regular rate and  rhythm, no clicks rubs or murmurs are auscultated. Chest is clear to auscultation bilaterally. Lymphatic assessment is performed and does not reveal any adenopathy in the supraclavicular, axillary, or inguinal chains. Abdomen has active bowel sounds in all quadrants and is intact. The abdomen is soft, non tender, non distended. Lower extremities are negative for pretibial pitting edema, deep calf tenderness, cyanosis or clubbing.   KPS = 90  100 - Normal; no complaints; no evidence of disease. 90   - Able to carry on normal activity; minor signs  or symptoms of disease. 80   - Normal activity with effort; some signs or symptoms of disease. 55   - Cares for self; unable to carry on normal activity or to do active work. 60   - Requires occasional assistance, but is able to care for most of his personal needs. 50   - Requires considerable assistance and frequent medical care. 13   - Disabled; requires special care and assistance. 21   - Severely disabled; hospital admission is indicated although death not imminent. 57   - Very sick; hospital admission necessary; active supportive treatment necessary. 10   - Moribund; fatal processes progressing rapidly. 0     - Dead  Karnofsky DA, Abelmann [place], Craver LS and Burchenal [facility] [phone]) The use of the nitrogen mustards in the palliative treatment of carcinoma: with particular reference to bronchogenic carcinoma Cancer 1 634-56  LABORATORY DATA:  Lab Results  Component Value Date   WBC 5.6 11/18/2016   HGB 14.6 11/18/2016   HCT 44.2 11/18/2016   MCV 91.9 11/18/2016   PLT 228.0 11/18/2016   Lab Results  Component Value Date   NA 140 11/18/2016   K 3.9 11/18/2016   CL 105 11/18/2016   CO2 25 11/18/2016   Lab Results  Component Value Date   ALT 11 11/18/2016   AST 14 11/18/2016   ALKPHOS 57 11/18/2016   BILITOT 0.5 11/18/2016     RADIOGRAPHY: No results found.    IMPRESSION/PLAN: 1. 72 y.o. gentleman with Intermittent Risk, Stage T1c  adenocarcinoma of the prostate with Gleason Score of 4+3, and PSA of 8.13.  We reviewed the pathology, and discusses the Gleason's Score, PSA, and T scoring as they pertain to diagnosis and management of cancer. We discussed the options of treatment with surgical resection, external beam radiotherapy, and radioactive seed implant. We discussed the risks, benefits, short, and long term effects of radiotherapy, and the patient is going to consider all of his options. We discussed the delivery and logistics of radiotherapy of either form. We will contact him in about a week or two to see if he has made a decision regarding treatment options.  2. Possible genetic predisposition to malignancy. Given the patient's personal and family history of brain, breast, and stomach cancer, he is a candidate to meet with our genetic counselor to discuss testing. If he is interested in pursuing this, a referral will be placed, and he will notify of [place] of his interest in referral.      Carola Rhine, [place], [place]: [phone]  Fax: [phone] [email or web address]  Skype  [email or web address]   This document serves as a record of services personally performed by Tyler Pita, MD and Shona Simpson, PA-C. It was created on their behalf by Rae Lips, a trained medical scribe. The creation of this record is based on the scribe's personal observations and the providers' statements to them. This document has been checked and approved by the attending providers.

## 2017-03-29 ENCOUNTER — Other Ambulatory Visit: Payer: Self-pay | Admitting: Urology

## 2017-04-01 ENCOUNTER — Encounter: Payer: Self-pay | Admitting: Medical Oncology

## 2017-04-01 NOTE — Progress Notes (Signed)
Joseph Golden followd up with Dr. Gloriann Loan 3/15 and has decided to have robotic prostatectomy. Dr. Tammi Klippel notified.

## 2017-05-02 ENCOUNTER — Telehealth: Payer: Self-pay | Admitting: Internal Medicine

## 2017-05-02 NOTE — Telephone Encounter (Signed)
Patient   recently diagnosed with prostate cancer, to have surgery.  No radiation therapy is planned. He did not see GI for the rectal polyp b/c  he was obviously busy with this other issue.   Will let GI know about the rectal polyp

## 2017-05-11 ENCOUNTER — Telehealth: Payer: Self-pay

## 2017-05-11 NOTE — Telephone Encounter (Signed)
Left message for patient to call back  

## 2017-05-11 NOTE — Telephone Encounter (Signed)
-----   Message from Gatha Mayer, MD sent at 05/11/2017  9:44 AM EDT ----- Regarding: needs colonoscopy Please schedule colonoscopy for this patient next week or week after (he is having prostatectomy 5/29)  Hx colon polyps Palpable polyp in rectum on PCP exam  I have a couple slots it seems or can use an 0730 anyday except May 9  Thanks [facility]

## 2017-05-12 NOTE — Telephone Encounter (Signed)
Patient notified of the recommendations Pre-visit and colon scheduled for 5/6 and 5/8

## 2017-05-16 ENCOUNTER — Other Ambulatory Visit: Payer: Self-pay

## 2017-05-16 ENCOUNTER — Ambulatory Visit (AMBULATORY_SURGERY_CENTER): Payer: Self-pay | Admitting: *Deleted

## 2017-05-16 VITALS — Ht 70.5 in | Wt 192.0 lb

## 2017-05-16 DIAGNOSIS — Z86010 Personal history of colonic polyps: Secondary | ICD-10-CM

## 2017-05-16 NOTE — Progress Notes (Signed)
Patient denies any allergies to eggs or soy. Patient denies any problems with anesthesia/sedation. Patient denies any oxygen use at home. Patient denies taking any diet/weight loss medications or blood thinners. EMMI education declined, pt does not use computer.

## 2017-05-18 ENCOUNTER — Other Ambulatory Visit: Payer: Self-pay

## 2017-05-18 ENCOUNTER — Ambulatory Visit (AMBULATORY_SURGERY_CENTER): Payer: Medicare Other | Admitting: Internal Medicine

## 2017-05-18 ENCOUNTER — Encounter: Payer: Self-pay | Admitting: Internal Medicine

## 2017-05-18 VITALS — BP 106/61 | HR 56 | Temp 97.3°F | Resp 17 | Ht 70.5 in | Wt 192.0 lb

## 2017-05-18 DIAGNOSIS — Z86010 Personal history of colonic polyps: Secondary | ICD-10-CM | POA: Diagnosis not present

## 2017-05-18 MED ORDER — SODIUM CHLORIDE 0.9 % IV SOLN: 500.0000 mL | Freq: Once | INTRAVENOUS | Status: DC

## 2017-05-18 NOTE — Progress Notes (Signed)
Pt's states no medical or surgical changes since previsit or office visit. 

## 2017-05-18 NOTE — Op Note (Signed)
[place] Patient Name: Joseph Golden Procedure Date: 05/18/2017 7:29 AM MRN: [id number] Endoscopist: Gatha Mayer , MD Age: 72 Referring MD:  Date of Birth: 12/04/1945 Gender: Male Account #: [id number] Procedure:                Colonoscopy Indications:              Surveillance: Personal history of adenomatous                            polyps on last colonoscopy > 3 years ago Medicines:                Propofol per Anesthesia, Monitored Anesthesia Care Procedure:                Pre-Anesthesia Assessment:                           - Prior to the procedure, a History and Physical                            was performed, and patient medications and                            allergies were reviewed. The patient's tolerance of                            previous anesthesia was also reviewed. The risks                            and benefits of the procedure and the sedation                            options and risks were discussed with the patient.                            All questions were answered, and informed consent                            was obtained. Prior Anticoagulants: The patient has                            taken no previous anticoagulant or antiplatelet                            agents. ASA Grade Assessment: II - A patient with                            mild systemic disease. After reviewing the risks                            and benefits, the patient was deemed in                            satisfactory condition to undergo the procedure.  After obtaining informed consent, the colonoscope                            was passed under direct vision. Throughout the                            procedure, the patient's blood pressure, pulse, and                            oxygen saturations were monitored continuously. The                            Model CF-HQ190L [id number]) scope was introduced   through the anus and advanced to the the cecum,                            identified by appendiceal orifice and ileocecal                            valve. The colonoscopy was performed without                            difficulty. The patient tolerated the procedure                            well. The quality of the bowel preparation was                            excellent. The bowel preparation used was Miralax.                            The ileocecal valve, appendiceal orifice, and                            rectum were photographed. Scope In: 7:35:52 AM Scope Out: 7:54:01 AM Scope Withdrawal Time: 0 hours 14 minutes 48 seconds  Total Procedure Duration: 0 hours 18 minutes 9 seconds  Findings:                 Multiple diverticula were found in the sigmoid                            colon.                           There was a medium-sized lipoma, in the ascending                            colon.                           The exam was otherwise without abnormality on                            direct and retroflexion views. Complications:  No immediate complications. Estimated Blood Loss:     Estimated blood loss: none. Impression:               - Diverticulosis in the sigmoid colon.                           - Medium-sized lipoma in the ascending colon.                           - The examination was otherwise normal on direct                            and retroflexion views.                           - No specimens collected.                           - Personal history of colonic polyps. 12 mm Adenoma                            w/ high-grade dysplasia 2013 Recommendation:           - Patient has a contact number available for                            emergencies. The signs and symptoms of potential                            delayed complications were discussed with the                            patient. Return to normal activities tomorrow.                             Written discharge instructions were provided to the                            patient.                           - Resume previous diet.                           - Continue present medications.                           - Repeat colonoscopy in 5 years for surveillance. Gatha Mayer, MD 05/18/2017 8:04:00 AM This report has been signed electronically.

## 2017-05-18 NOTE — Progress Notes (Signed)
Report given to PACU, vss 

## 2017-05-18 NOTE — Patient Instructions (Addendum)
No polyps today!  You do have diverticulosis - thickened muscle rings and pouches in the colon wall. Please read the handout about this condition.  Your next routine colonoscopy should be in 5 years - 2024.  Good luck with your prostate surgery.  I appreciate the opportunity to care for you. Gatha Mayer, MD, FACG YOU HAD AN ENDOSCOPIC PROCEDURE TODAY AT [facility]:   Refer to the procedure report that was given to you for any specific questions about what was found during the examination.  If the procedure report does not answer your questions, please call your gastroenterologist to clarify.  If you requested that your care partner not be given the details of your procedure findings, then the procedure report has been included in a sealed envelope for you to review at your convenience later.  YOU SHOULD EXPECT: Some feelings of bloating in the abdomen. Passage of more gas than usual.  Walking can help get rid of the air that was put into your GI tract during the procedure and reduce the bloating. If you had a lower endoscopy (such as a colonoscopy or flexible sigmoidoscopy) you may notice spotting of blood in your stool or on the toilet paper. If you underwent a bowel prep for your procedure, you may not have a normal bowel movement for a few days.  Please Note:  You might notice some irritation and congestion in your nose or some drainage.  This is from the oxygen used during your procedure.  There is no need for concern and it should clear up in a day or so.  SYMPTOMS TO REPORT IMMEDIATELY:   Following lower endoscopy (colonoscopy or flexible sigmoidoscopy):  Excessive amounts of blood in the stool  Significant tenderness or worsening of abdominal pains  Swelling of the abdomen that is new, acute  Fever of 100F or higher  For urgent or emergent issues, a gastroenterologist can be reached at any hour by calling [phone].   DIET:  We do recommend a  small meal at first, but then you may proceed to your regular diet.  Drink plenty of fluids but you should avoid alcoholic beverages for 24 hours.  ACTIVITY:  You should plan to take it easy for the rest of today and you should NOT DRIVE or use heavy machinery until tomorrow (because of the sedation medicines used during the test).    FOLLOW UP: Our staff will call the number listed on your records the next business day following your procedure to check on you and address any questions or concerns that you may have regarding the information given to you following your procedure. If we do not reach you, we will leave a message.  However, if you are feeling well and you are not experiencing any problems, there is no need to return our call.  We will assume that you have returned to your regular daily activities without incident.  If any biopsies were taken you will be contacted by phone or by letter within the next 1-3 weeks.  Please call us at [phone] if you have not heard about the biopsies in 3 weeks.    SIGNATURES/CONFIDENTIALITY: You and/or your care partner have signed paperwork which will be entered into your electronic medical record.  These signatures attest to the fact that that the information above on your After Visit Summary has been reviewed and is understood.  Full responsibility of the confidentiality of this discharge information lies with you and/or  your care-partner. 

## 2017-05-19 ENCOUNTER — Telehealth: Payer: Self-pay

## 2017-05-19 NOTE — Telephone Encounter (Signed)
  Follow up Call-  Call back number 05/18/2017  Post procedure Call Back phone  # [phone]  Permission to leave phone message Yes  Some recent data might be hidden     Patient questions:  Do you have a fever, pain , or abdominal swelling? No. Pain Score  0 *  Have you tolerated food without any problems? Yes.    Have you been able to return to your normal activities? Yes.    Do you have any questions about your discharge instructions: Diet   No. Medications  No. Follow up visit  No.  Do you have questions or concerns about your Care? No.  Actions: * If pain score is 4 or above: No action needed, pain <4.

## 2017-05-27 NOTE — Patient Instructions (Addendum)
MERLEN GURRY  05/27/2017   Your procedure is scheduled on: 06-08-17  Report to [facility] Main  Entrance             Report to admitting at     [place] AM    Call this number if you have problems the morning of surgery [phone]   Remember: Do not eat food or drink liquids :After Midnight.     Take these medicines the morning of surgery with A SIP OF WATER: carvedilol, amlodipine                                You may not have any metal on your body including hair pins and              piercings  Do not wear jewelry,  lotions, powders or perfumes, deodorant                       Men may shave face and neck.   Do not bring valuables to the hospital. [place].  Contacts, dentures or bridgework may not be worn into surgery.  Leave suitcase in the car. After surgery it may be brought to your room.                  Please read over the following fact sheets you were given: _____________________________________________________________________           [facility] - Preparing for Surgery Before surgery, you can play an important role.  Because skin is not sterile, your skin needs to be as free of germs as possible.  You can reduce the number of germs on your skin by washing with CHG (chlorahexidine gluconate) soap before surgery.  CHG is an antiseptic cleaner which kills germs and bonds with the skin to continue killing germs even after washing. Please DO NOT use if you have an allergy to CHG or antibacterial soaps.  If your skin becomes reddened/irritated stop using the CHG and inform your nurse when you arrive at Short Stay. Do not shave (including legs and underarms) for at least 48 hours prior to the first CHG shower.  You may shave your face/neck. Please follow these instructions carefully:  1.  Shower with CHG Soap the night before surgery and the  morning of Surgery.  2.  If you choose to wash your hair,  wash your hair first as usual with your  normal  shampoo.  3.  After you shampoo, rinse your hair and body thoroughly to remove the  shampoo.                           4.  Use CHG as you would any other liquid soap.  You can apply chg directly  to the skin and wash                       Gently with a scrungie or clean washcloth.  5.  Apply the CHG Soap to your body ONLY FROM THE NECK DOWN.   Do not use on face/ open  Wound or open sores. Avoid contact with eyes, ears mouth and genitals (private parts).                       Wash face,  Genitals (private parts) with your normal soap.             6.  Wash thoroughly, paying special attention to the area where your surgery  will be performed.  7.  Thoroughly rinse your body with warm water from the neck down.  8.  DO NOT shower/wash with your normal soap after using and rinsing off  the CHG Soap.                9.  Pat yourself dry with a clean towel.            10.  Wear clean pajamas.            11.  Place clean sheets on your bed the night of your first shower and do not  sleep with pets. Day of Surgery : Do not apply any lotions/deodorants the morning of surgery.  Please wear clean clothes to the hospital/surgery center.  FAILURE TO FOLLOW THESE INSTRUCTIONS MAY RESULT IN THE CANCELLATION OF YOUR SURGERY PATIENT SIGNATURE_________________________________  NURSE SIGNATURE__________________________________  ________________________________________________________________________  WHAT IS A BLOOD TRANSFUSION? Blood Transfusion Information  A transfusion is the replacement of blood or some of its parts. Blood is made up of multiple cells which provide different functions.  Red blood cells carry oxygen and are used for blood loss replacement.  White blood cells fight against infection.  Platelets control bleeding.  Plasma helps clot blood.  Other blood products are available for specialized needs, such as  hemophilia or other clotting disorders. BEFORE THE TRANSFUSION  Who gives blood for transfusions?   Healthy volunteers who are fully evaluated to make sure their blood is safe. This is blood bank blood. Transfusion therapy is the safest it has ever been in the practice of medicine. Before blood is taken from a donor, a complete history is taken to make sure that person has no history of diseases nor engages in risky social behavior (examples are intravenous drug use or sexual activity with multiple partners). The donor's travel history is screened to minimize risk of transmitting infections, such as malaria. The donated blood is tested for signs of infectious diseases, such as HIV and hepatitis. The blood is then tested to be sure it is compatible with you in order to minimize the chance of a transfusion reaction. If you or a relative donates blood, this is often done in anticipation of surgery and is not appropriate for emergency situations. It takes many days to process the donated blood. RISKS AND COMPLICATIONS Although transfusion therapy is very safe and saves many lives, the main dangers of transfusion include:   Getting an infectious disease.  Developing a transfusion reaction. This is an allergic reaction to something in the blood you were given. Every precaution is taken to prevent this. The decision to have a blood transfusion has been considered carefully by your caregiver before blood is given. Blood is not given unless the benefits outweigh the risks. AFTER THE TRANSFUSION  Right after receiving a blood transfusion, you will usually feel much better and more energetic. This is especially true if your red blood cells have gotten low (anemic). The transfusion raises the level of the red blood cells which carry oxygen, and this usually causes an energy increase.  The  nurse administering the transfusion will monitor you carefully for complications. HOME CARE INSTRUCTIONS  No special  instructions are needed after a transfusion. You may find your energy is better. Speak with your caregiver about any limitations on activity for underlying diseases you may have. SEEK MEDICAL CARE IF:   Your condition is not improving after your transfusion.  You develop redness or irritation at the intravenous (IV) site. SEEK IMMEDIATE MEDICAL CARE IF:  Any of the following symptoms occur over the next 12 hours:  Shaking chills.  You have a temperature by mouth above 102 F (38.9 C), not controlled by medicine.  Chest, back, or muscle pain.  People around you feel you are not acting correctly or are confused.  Shortness of breath or difficulty breathing.  Dizziness and fainting.  You get a rash or develop hives.  You have a decrease in urine output.  Your urine turns a dark color or changes to pink, red, or brown. Any of the following symptoms occur over the next 10 days:  You have a temperature by mouth above 102 F (38.9 C), not controlled by medicine.  Shortness of breath.  Weakness after normal activity.  The white part of the eye turns yellow (jaundice).  You have a decrease in the amount of urine or are urinating less often.  Your urine turns a dark color or changes to pink, red, or brown. Document Released: 12/26/1999 Document Revised: 03/22/2011 Document Reviewed: 08/14/2007 [facility] Patient Information 2014 [place], [place].  _______________________________________________________________________

## 2017-05-30 ENCOUNTER — Encounter (HOSPITAL_COMMUNITY)
Admission: RE | Admit: 2017-05-30 | Discharge: 2017-05-30 | Disposition: A | Discharge status: Home / Self Care | Payer: Medicare Other | Source: Ambulatory Visit | Attending: Urology | Admitting: Urology

## 2017-05-30 ENCOUNTER — Other Ambulatory Visit: Payer: Self-pay

## 2017-05-30 ENCOUNTER — Encounter (HOSPITAL_COMMUNITY): Payer: Self-pay

## 2017-05-30 DIAGNOSIS — Z01812 Encounter for preprocedural laboratory examination: Secondary | ICD-10-CM | POA: Diagnosis present

## 2017-05-30 DIAGNOSIS — Z01810 Encounter for preprocedural cardiovascular examination: Secondary | ICD-10-CM | POA: Insufficient documentation

## 2017-05-30 DIAGNOSIS — C61 Malignant neoplasm of prostate: Secondary | ICD-10-CM | POA: Insufficient documentation

## 2017-05-30 HISTORY — DX: Personal history of other diseases of the digestive system: Z87.19

## 2017-05-30 LAB — BASIC METABOLIC PANEL
Anion gap: 9 (ref 5–15)
BUN: 16 mg/dL (ref 6–20)
CALCIUM: 9.3 mg/dL (ref 8.9–10.3)
CO2: 26 mmol/L (ref 22–32)
CREATININE: 0.86 mg/dL (ref 0.61–1.24)
Chloride: 109 mmol/L (ref 101–111)
GFR calc Af Amer: >60 mL/min (ref >60)
GFR calc non Af Amer: >60 mL/min (ref >60)
GLUCOSE: 106 mg/dL — AB (ref 65–99)
Potassium: 3.9 mmol/L (ref 3.5–5.1)
Sodium: 144 mmol/L (ref 135–145)

## 2017-05-30 LAB — CBC
HCT: 41.9 % (ref 39.0–52.0)
Hemoglobin: 14.1 g/dL (ref 13.0–17.0)
MCH: 30.7 pg (ref 26.0–34.0)
MCHC: 33.7 g/dL (ref 30.0–36.0)
MCV: 91.1 fL (ref 78.0–100.0)
PLATELETS: 231 10*3/uL (ref 150–400)
RBC: 4.6 MIL/uL (ref 4.22–5.81)
RDW: 14 % (ref 11.5–15.5)
WBC: 5.1 10*3/uL (ref 4.0–10.5)

## 2017-05-30 LAB — PROTIME-INR
INR: 0.99
PROTHROMBIN TIME: 13 s (ref 11.4–15.2)

## 2017-05-30 LAB — ABO/RH: ABO/RH(D): A POS

## 2017-05-30 NOTE — Progress Notes (Signed)
Final ekg done 05-30-17 in epic

## 2017-06-07 NOTE — Anesthesia Preprocedure Evaluation (Addendum)
Anesthesia Evaluation  Patient identified by MRN, date of birth, ID band Patient awake    Reviewed: Allergy & Precautions, NPO status , Patient's Chart, lab work & pertinent test results  Airway Mallampati: II  TM Distance: >3 FB Neck ROM: Full    Dental no notable dental hx. (+) Edentulous Upper, Edentulous Lower   Pulmonary neg pulmonary ROS, former smoker,    Pulmonary exam normal breath sounds clear to auscultation       Cardiovascular hypertension, Pt. on home beta blockers and Pt. on medications Normal cardiovascular exam Rhythm:Regular Rate:Normal     Neuro/Psych negative neurological ROS  negative psych ROS   GI/Hepatic Neg liver ROS,   Endo/Other  negative endocrine ROS  Renal/GU negative Renal ROS     Musculoskeletal negative musculoskeletal ROS (+)   Abdominal   Peds  Hematology  (+) anemia ,   Anesthesia Other Findings   Reproductive/Obstetrics                            Lab Results  Component Value Date   WBC 5.1 05/30/2017   HGB 14.1 05/30/2017   HCT 41.9 05/30/2017   MCV 91.1 05/30/2017   PLT 231 05/30/2017   Lab Results  Component Value Date   CREATININE 0.86 05/30/2017   BUN 16 05/30/2017   NA 144 05/30/2017   K 3.9 05/30/2017   CL 109 05/30/2017   CO2 26 05/30/2017    Anesthesia Physical Anesthesia Plan  ASA: II  Anesthesia Plan: General   Post-op Pain Management:    Induction: Intravenous  PONV Risk Score and Plan: 2 and Treatment may vary due to age or medical condition and Ondansetron  Airway Management Planned: Oral ETT  Additional Equipment:   Intra-op Plan:   Post-operative Plan: Extubation in OR  Informed Consent: I have reviewed the patients History and Physical, chart, labs and discussed the procedure including the risks, benefits and alternatives for the proposed anesthesia with the patient or authorized representative who has  indicated his/her understanding and acceptance.   Dental advisory given  Plan Discussed with: CRNA  Anesthesia Plan Comments:         Anesthesia Quick Evaluation

## 2017-06-08 ENCOUNTER — Encounter (HOSPITAL_COMMUNITY): Payer: Self-pay

## 2017-06-08 ENCOUNTER — Ambulatory Visit (HOSPITAL_COMMUNITY): Payer: Medicare Other | Admitting: Anesthesiology

## 2017-06-08 ENCOUNTER — Encounter: Payer: Self-pay | Admitting: Medical Oncology

## 2017-06-08 ENCOUNTER — Other Ambulatory Visit: Payer: Self-pay

## 2017-06-08 ENCOUNTER — Encounter (HOSPITAL_COMMUNITY)
Admission: RE | Disposition: A | Discharge status: Home / Self Care | Payer: Self-pay | Source: Ambulatory Visit | Attending: Urology

## 2017-06-08 ENCOUNTER — Observation Stay (HOSPITAL_COMMUNITY)
Admission: RE | Admit: 2017-06-08 | Discharge: 2017-06-09 | Disposition: A | Discharge status: Home / Self Care | Payer: Medicare Other | Source: Ambulatory Visit | Attending: Urology | Admitting: Urology

## 2017-06-08 DIAGNOSIS — Z87891 Personal history of nicotine dependence: Secondary | ICD-10-CM | POA: Insufficient documentation

## 2017-06-08 DIAGNOSIS — I10 Essential (primary) hypertension: Secondary | ICD-10-CM | POA: Insufficient documentation

## 2017-06-08 DIAGNOSIS — D291 Benign neoplasm of prostate: Secondary | ICD-10-CM | POA: Diagnosis not present

## 2017-06-08 DIAGNOSIS — C61 Malignant neoplasm of prostate: Principal | ICD-10-CM | POA: Insufficient documentation

## 2017-06-08 DIAGNOSIS — Z79899 Other long term (current) drug therapy: Secondary | ICD-10-CM | POA: Diagnosis not present

## 2017-06-08 HISTORY — PX: LYMPHADENECTOMY: SHX5960

## 2017-06-08 HISTORY — PX: ROBOT ASSISTED LAPAROSCOPIC RADICAL PROSTATECTOMY: SHX5141

## 2017-06-08 LAB — TYPE AND SCREEN
ABO/RH(D): A POS
Antibody Screen: NEGATIVE

## 2017-06-08 LAB — BASIC METABOLIC PANEL
ANION GAP: 9 (ref 5–15)
BUN: 14 mg/dL (ref 6–20)
CO2: 26 mmol/L (ref 22–32)
Calcium: 8.8 mg/dL — ABNORMAL LOW (ref 8.9–10.3)
Chloride: 105 mmol/L (ref 101–111)
Creatinine, Ser: 1.06 mg/dL (ref 0.61–1.24)
GFR calc Af Amer: >60 mL/min (ref >60)
Glucose, Bld: 162 mg/dL — ABNORMAL HIGH (ref 65–99)
POTASSIUM: 4.1 mmol/L (ref 3.5–5.1)
SODIUM: 140 mmol/L (ref 135–145)

## 2017-06-08 LAB — HEMOGLOBIN AND HEMATOCRIT, BLOOD
HCT: 43.1 % (ref 39.0–52.0)
Hemoglobin: 14.7 g/dL (ref 13.0–17.0)

## 2017-06-08 SURGERY — PROSTATECTOMY, RADICAL, ROBOT-ASSISTED, LAPAROSCOPIC
Anesthesia: General

## 2017-06-08 MED ORDER — LIDOCAINE HCL (CARDIAC) PF 100 MG/5ML IV SOSY
PREFILLED_SYRINGE | INTRAVENOUS | Status: DC | PRN
  Administered 2017-06-08: 100 mg via INTRAVENOUS

## 2017-06-08 MED ORDER — LOSARTAN POTASSIUM 50 MG PO TABS
50.0000 mg | ORAL_TABLET | Freq: Every day | ORAL | Status: DC
  Administered 2017-06-09: 50 mg via ORAL
  Filled 2017-06-08: qty 1

## 2017-06-08 MED ORDER — HYDROCODONE-ACETAMINOPHEN 7.5-325 MG PO TABS: 1.0000 | ORAL_TABLET | Freq: Once | ORAL | Status: DC | PRN

## 2017-06-08 MED ORDER — LOSARTAN POTASSIUM-HCTZ 50-12.5 MG PO TABS: 1.0000 | ORAL_TABLET | Freq: Every day | ORAL | Status: DC

## 2017-06-08 MED ORDER — ROCURONIUM BROMIDE 100 MG/10ML IV SOLN
INTRAVENOUS | Status: DC | PRN
  Administered 2017-06-08: 20 mg via INTRAVENOUS
  Administered 2017-06-08: 10 mg via INTRAVENOUS
  Administered 2017-06-08: 50 mg via INTRAVENOUS

## 2017-06-08 MED ORDER — PROPOFOL 10 MG/ML IV BOLUS
INTRAVENOUS | Status: DC | PRN
  Administered 2017-06-08: 150 mg via INTRAVENOUS

## 2017-06-08 MED ORDER — DIPHENHYDRAMINE HCL 12.5 MG/5ML PO ELIX: 12.5000 mg | ORAL_SOLUTION | Freq: Four times a day (QID) | ORAL | Status: DC | PRN

## 2017-06-08 MED ORDER — PROPOFOL 10 MG/ML IV BOLUS
INTRAVENOUS | Status: AC
Start: 2017-06-08 — End: ?
  Filled 2017-06-08: qty 20

## 2017-06-08 MED ORDER — KETAMINE HCL 10 MG/ML IJ SOLN
INTRAMUSCULAR | Status: DC | PRN
  Administered 2017-06-08: 45 mg via INTRAVENOUS

## 2017-06-08 MED ORDER — AMLODIPINE BESYLATE 10 MG PO TABS
10.0000 mg | ORAL_TABLET | Freq: Every day | ORAL | Status: DC
  Administered 2017-06-09: 10 mg via ORAL
  Filled 2017-06-08: qty 1

## 2017-06-08 MED ORDER — STERILE WATER FOR IRRIGATION IR SOLN
Status: DC | PRN
  Administered 2017-06-08: 1000 mL

## 2017-06-08 MED ORDER — HYDROMORPHONE HCL 1 MG/ML IJ SOLN: 0.2500 mg | INTRAMUSCULAR | Status: DC | PRN

## 2017-06-08 MED ORDER — OXYBUTYNIN CHLORIDE 5 MG PO TABS
5.0000 mg | ORAL_TABLET | Freq: Three times a day (TID) | ORAL | Status: DC | PRN
  Administered 2017-06-08: 5 mg via ORAL
  Filled 2017-06-08: qty 1

## 2017-06-08 MED ORDER — HYDROCHLOROTHIAZIDE 12.5 MG PO CAPS
12.5000 mg | ORAL_CAPSULE | Freq: Every day | ORAL | Status: DC
  Administered 2017-06-09: 12.5 mg via ORAL
  Filled 2017-06-08: qty 1

## 2017-06-08 MED ORDER — CEFAZOLIN SODIUM-DEXTROSE 2-4 GM/100ML-% IV SOLN
2.0000 g | INTRAVENOUS | Status: AC
  Administered 2017-06-08: 2 g via INTRAVENOUS
  Filled 2017-06-08: qty 100

## 2017-06-08 MED ORDER — MIDAZOLAM HCL 5 MG/5ML IJ SOLN
INTRAMUSCULAR | Status: DC | PRN
  Administered 2017-06-08: 2 mg via INTRAVENOUS

## 2017-06-08 MED ORDER — ZOLPIDEM TARTRATE 5 MG PO TABS: 5.0000 mg | ORAL_TABLET | Freq: Every evening | ORAL | Status: DC | PRN

## 2017-06-08 MED ORDER — LACTATED RINGERS IR SOLN
Status: DC | PRN
  Administered 2017-06-08: 1000 mL

## 2017-06-08 MED ORDER — DOCUSATE SODIUM 100 MG PO CAPS
100.0000 mg | ORAL_CAPSULE | Freq: Two times a day (BID) | ORAL | Status: DC
  Administered 2017-06-08 – 2017-06-09 (×2): 100 mg via ORAL
  Filled 2017-06-08 (×2): qty 1

## 2017-06-08 MED ORDER — ONDANSETRON HCL 4 MG/2ML IJ SOLN
INTRAMUSCULAR | Status: AC
  Filled 2017-06-08: qty 2

## 2017-06-08 MED ORDER — BACITRACIN-NEOMYCIN-POLYMYXIN 400-5-5000 EX OINT: 1.0000 "application " | TOPICAL_OINTMENT | Freq: Three times a day (TID) | CUTANEOUS | Status: DC | PRN

## 2017-06-08 MED ORDER — SUGAMMADEX SODIUM 200 MG/2ML IV SOLN
INTRAVENOUS | Status: DC | PRN
  Administered 2017-06-08: 200 mg via INTRAVENOUS

## 2017-06-08 MED ORDER — MIDAZOLAM HCL 2 MG/2ML IJ SOLN
INTRAMUSCULAR | Status: AC
  Filled 2017-06-08: qty 2

## 2017-06-08 MED ORDER — MORPHINE SULFATE (PF) 4 MG/ML IV SOLN
2.0000 mg | INTRAVENOUS | Status: DC | PRN
  Administered 2017-06-08: 2 mg via INTRAVENOUS
  Filled 2017-06-08: qty 1

## 2017-06-08 MED ORDER — ONDANSETRON HCL 4 MG/2ML IJ SOLN: 4.0000 mg | INTRAMUSCULAR | Status: DC | PRN

## 2017-06-08 MED ORDER — KETAMINE HCL 10 MG/ML IJ SOLN
INTRAMUSCULAR | Status: AC
  Filled 2017-06-08: qty 1

## 2017-06-08 MED ORDER — FENTANYL CITRATE (PF) 100 MCG/2ML IJ SOLN
INTRAMUSCULAR | Status: DC | PRN
  Administered 2017-06-08 (×2): 50 ug via INTRAVENOUS
  Administered 2017-06-08: 100 ug via INTRAVENOUS
  Administered 2017-06-08: 50 ug via INTRAVENOUS

## 2017-06-08 MED ORDER — LACTATED RINGERS IV SOLN
INTRAVENOUS | Status: DC
  Administered 2017-06-08: 10:00:00 via INTRAVENOUS
  Administered 2017-06-08: 1000 mL via INTRAVENOUS

## 2017-06-08 MED ORDER — HYDROCODONE-ACETAMINOPHEN 5-325 MG PO TABS: 1.0000 | ORAL_TABLET | Freq: Four times a day (QID) | ORAL | 0 refills | Status: DC | PRN

## 2017-06-08 MED ORDER — SUGAMMADEX SODIUM 200 MG/2ML IV SOLN
INTRAVENOUS | Status: AC
  Filled 2017-06-08: qty 2

## 2017-06-08 MED ORDER — DIPHENHYDRAMINE HCL 50 MG/ML IJ SOLN: 12.5000 mg | Freq: Four times a day (QID) | INTRAMUSCULAR | Status: DC | PRN

## 2017-06-08 MED ORDER — ACETAMINOPHEN 325 MG PO TABS: 650.0000 mg | ORAL_TABLET | ORAL | Status: DC | PRN

## 2017-06-08 MED ORDER — SODIUM CHLORIDE 0.9 % IV SOLN
INTRAVENOUS | Status: DC
  Administered 2017-06-08 – 2017-06-09 (×3): via INTRAVENOUS

## 2017-06-08 MED ORDER — OXYCODONE HCL 5 MG PO TABS
5.0000 mg | ORAL_TABLET | ORAL | Status: DC | PRN
  Administered 2017-06-08 – 2017-06-09 (×3): 5 mg via ORAL
  Filled 2017-06-08 (×3): qty 1

## 2017-06-08 MED ORDER — MEPERIDINE HCL 50 MG/ML IJ SOLN: 6.2500 mg | INTRAMUSCULAR | Status: DC | PRN

## 2017-06-08 MED ORDER — SODIUM CHLORIDE 0.9 % IJ SOLN
INTRAMUSCULAR | Status: DC | PRN
  Administered 2017-06-08: 20 mL

## 2017-06-08 MED ORDER — SODIUM CHLORIDE 0.9 % IJ SOLN
INTRAMUSCULAR | Status: AC
  Filled 2017-06-08: qty 50

## 2017-06-08 MED ORDER — ACETAMINOPHEN 10 MG/ML IV SOLN: 1000.0000 mg | Freq: Once | INTRAVENOUS | Status: DC | PRN

## 2017-06-08 MED ORDER — ONDANSETRON HCL 4 MG/2ML IJ SOLN
INTRAMUSCULAR | Status: DC | PRN
  Administered 2017-06-08: 4 mg via INTRAVENOUS

## 2017-06-08 MED ORDER — CARVEDILOL 6.25 MG PO TABS
6.2500 mg | ORAL_TABLET | Freq: Two times a day (BID) | ORAL | Status: DC
  Administered 2017-06-08 – 2017-06-09 (×2): 6.25 mg via ORAL
  Filled 2017-06-08 (×2): qty 1

## 2017-06-08 MED ORDER — FENTANYL CITRATE (PF) 250 MCG/5ML IJ SOLN
INTRAMUSCULAR | Status: AC
  Filled 2017-06-08: qty 5

## 2017-06-08 MED ORDER — CEFAZOLIN SODIUM-DEXTROSE 1-4 GM/50ML-% IV SOLN
1.0000 g | Freq: Three times a day (TID) | INTRAVENOUS | Status: AC
  Administered 2017-06-08 (×2): 1 g via INTRAVENOUS
  Filled 2017-06-08 (×2): qty 50

## 2017-06-08 MED ORDER — PROMETHAZINE HCL 25 MG/ML IJ SOLN
6.2500 mg | INTRAMUSCULAR | Status: DC | PRN
Start: 2017-06-08 — End: 2017-06-08

## 2017-06-08 MED ORDER — DEXAMETHASONE SODIUM PHOSPHATE 10 MG/ML IJ SOLN
INTRAMUSCULAR | Status: DC | PRN
  Administered 2017-06-08: 10 mg via INTRAVENOUS

## 2017-06-08 MED ORDER — BUPIVACAINE LIPOSOME 1.3 % IJ SUSP
20.0000 mL | Freq: Once | INTRAMUSCULAR | Status: AC
  Administered 2017-06-08: 20 mL
  Filled 2017-06-08: qty 20

## 2017-06-08 MED ORDER — SENNA 8.6 MG PO TABS
1.0000 | ORAL_TABLET | Freq: Two times a day (BID) | ORAL | Status: DC
  Administered 2017-06-08 – 2017-06-09 (×2): 8.6 mg via ORAL
  Filled 2017-06-08 (×2): qty 1

## 2017-06-08 SURGICAL SUPPLY — 55 items
APPLICATOR COTTON TIP 6IN STRL (MISCELLANEOUS) ×8 IMPLANT
APPLICATOR SURGIFLO ENDO (HEMOSTASIS) ×4 IMPLANT
CATH FOLEY 2WAY SLVR  5CC 18FR (CATHETERS) ×2
CATH FOLEY 2WAY SLVR 5CC 18FR (CATHETERS) ×2 IMPLANT
CATH TIEMANN FOLEY 18FR 5CC (CATHETERS) ×4 IMPLANT
CHLORAPREP W/TINT 26ML (MISCELLANEOUS) ×4 IMPLANT
CLIP VESOLOCK LG 6/CT PURPLE (CLIP) ×12 IMPLANT
COVER SURGICAL LIGHT HANDLE (MISCELLANEOUS) ×4 IMPLANT
COVER TIP SHEARS 8 DVNC (MISCELLANEOUS) ×2 IMPLANT
COVER TIP SHEARS 8MM DA VINCI (MISCELLANEOUS) ×2
CUTTER ECHEON FLEX ENDO 45 340 (ENDOMECHANICALS) ×4 IMPLANT
DECANTER SPIKE VIAL GLASS SM (MISCELLANEOUS) ×4 IMPLANT
DERMABOND ADVANCED (GAUZE/BANDAGES/DRESSINGS) ×2
DERMABOND ADVANCED .7 DNX12 (GAUZE/BANDAGES/DRESSINGS) ×2 IMPLANT
DRAPE ARM DVNC X/XI (DISPOSABLE) ×8 IMPLANT
DRAPE COLUMN DVNC XI (DISPOSABLE) ×2 IMPLANT
DRAPE DA VINCI XI ARM (DISPOSABLE) ×8
DRAPE DA VINCI XI COLUMN (DISPOSABLE) ×2
DRAPE SURG IRRIG POUCH 19X23 (DRAPES) ×4 IMPLANT
DRSG TEGADERM 4X4.75 (GAUZE/BANDAGES/DRESSINGS) ×4 IMPLANT
ELECT REM PT RETURN 15FT ADLT (MISCELLANEOUS) ×4 IMPLANT
FLOSEAL 10ML (HEMOSTASIS) ×4 IMPLANT
GLOVE BIO SURGEON STRL SZ 6.5 (GLOVE) ×3 IMPLANT
GLOVE BIO SURGEONS STRL SZ 6.5 (GLOVE) ×1
GLOVE BIOGEL M STRL SZ7.5 (GLOVE) ×8 IMPLANT
GOWN STRL REUS W/TWL LRG LVL3 (GOWN DISPOSABLE) ×4 IMPLANT
HEMOSTAT SURGICEL 4X8 (HEMOSTASIS) IMPLANT
HOLDER FOLEY CATH W/STRAP (MISCELLANEOUS) ×4 IMPLANT
IRRIG SUCT STRYKERFLOW 2 WTIP (MISCELLANEOUS) ×4
IRRIGATION SUCT STRKRFLW 2 WTP (MISCELLANEOUS) ×2 IMPLANT
NDL SAFETY ECLIPSE 18X1.5 (NEEDLE) ×2 IMPLANT
NEEDLE HYPO 18GX1.5 SHARP (NEEDLE) ×2
NEEDLE INSUFFLATION 14GA 120MM (NEEDLE) ×4 IMPLANT
PACK ROBOT UROLOGY CUSTOM (CUSTOM PROCEDURE TRAY) ×4 IMPLANT
PAD POSITIONING PINK XL (MISCELLANEOUS) ×4 IMPLANT
PORT ACCESS TROCAR AIRSEAL 12 (TROCAR) ×2 IMPLANT
PORT ACCESS TROCAR AIRSEAL 5M (TROCAR) ×2
SEAL CANN UNIV 5-8 DVNC XI (MISCELLANEOUS) ×8 IMPLANT
SEAL XI 5MM-8MM UNIVERSAL (MISCELLANEOUS) ×8
SET TRI-LUMEN FLTR TB AIRSEAL (TUBING) ×4 IMPLANT
SOLUTION ELECTROLUBE (MISCELLANEOUS) ×4 IMPLANT
SPONGE LAP 4X18 RFD (DISPOSABLE) ×4 IMPLANT
STAPLE RELOAD 45 GRN (STAPLE) ×2 IMPLANT
STAPLE RELOAD 45MM GREEN (STAPLE) ×2
SUT ETHILON 3 0 PS 1 (SUTURE) ×4 IMPLANT
SUT MNCRL AB 4-0 PS2 18 (SUTURE) ×8 IMPLANT
SUT VIC AB 0 UR5 27 (SUTURE) ×4 IMPLANT
SUT VIC AB 2-0 SH 27 (SUTURE) ×2
SUT VIC AB 2-0 SH 27X BRD (SUTURE) ×2 IMPLANT
SUT VICRYL 0 UR6 27IN ABS (SUTURE) ×8 IMPLANT
SUT VLOC BARB 180 ABS3/0GR12 (SUTURE) ×8
SUTURE VLOC BRB 180 ABS3/0GR12 (SUTURE) ×4 IMPLANT
TOWEL OR 17X26 10 PK STRL BLUE (TOWEL DISPOSABLE) IMPLANT
TOWEL OR NON WOVEN STRL DISP B (DISPOSABLE) ×4 IMPLANT
TUBING INSUFFLATION 10FT LAP (TUBING) IMPLANT

## 2017-06-08 NOTE — H&P (Signed)
CC: I have prostate cancer.  HPI: Joseph Golden is a 72 year-old male established patient who is here evaluation for treatment of prostate cancer.  PSA 8.13  TRUS size 35 cc  No abdominal surgeries  Minimal lower urinary tract symptoms  Patient has some erectile dysfunction and is no longer sexually active. He does not use anything for erections.   Prostate biopsy unfortunately revealed 7 out of 12 cores positive for prostate cancer, up to 4+3. He did not have palpable disease on digital rectal exam (pT1c)   The patient met in consultation with Dr. Tammi Klippel and had a full discussion again about different options. He comes in today and has decided to proceed with robotic-assisted laparoscopic prostatectomy, non-nerve sparing, with bilateral pelvic lymph node dissection.   May 24, 2017: Here today for pre-procedural HPI. He endorses no changes in voiding symptoms with no recent fevers or urinary tract infection treatment. Denies painful or burning urination. Denies gross hematuria. He is going to attend the prostate cancer class over at [place] [facility] later this week.   His prostate cancer was diagnosed 02/03/2017. He does have the pathology report from his biopsy. His cancer was diagnosed by Dr. Gloriann Loan. His PSA at his time of diagnosis was 8.13.   He has not undergone surgery for treatment. He has not undergone External Beam Radiation Therapy for treatment. He has not undergone Hormonal Therapy for treatment.   He does not have urinary incontinence. He does have problems with erectile dysfunction. He has not recently had unwanted weight loss. He is not having pain in new locations.   ALLERGIES: No Allergies    MEDICATIONS: Amlodipine Besylate 10 mg tablet  Carvedilol 6.25 mg tablet  Losartan-Hydrochlorothiazide 50 mg-12.5 mg tablet    GU PSH: Prostate Needle Biopsy - 02/03/2017   NON-GU PSH: Surgical Pathology, Gross And Microscopic Examination For Prostate Needle - 02/03/2017   GU PMH: Prostate  Cancer - 03/25/2017, - 02/17/2017 Elevated PSA - 02/03/2017, - 01/07/2017   NON-GU PMH: Hypertension   FAMILY HISTORY: 1 son - Other 4 daughters - Other Cancer - Father   SOCIAL HISTORY: Marital Status: Married Preferred Language: English; Race: Black or African American Current Smoking Status: Patient does not smoke anymore.   Tobacco Use Assessment Completed:  Used Tobacco in last 30 days?  Does not drink caffeine.   REVIEW OF SYSTEMS:    GU Review Male:   Patient denies frequent urination, hard to postpone urination, burning/ pain with urination, get up at night to urinate, leakage of urine, stream starts and stops, trouble starting your stream, have to strain to urinate , erection problems, and penile pain.  Gastrointestinal (Upper):   Patient denies nausea, vomiting, and indigestion/ heartburn.  Gastrointestinal (Lower):   Patient denies diarrhea and constipation.  Constitutional:   Patient denies fever, night sweats, weight loss, and fatigue.  Skin:   Patient denies skin rash/ lesion and itching.  Eyes:   Patient denies blurred vision and double vision.  Ears/ Nose/ Throat:   Patient denies sore throat and sinus problems.  Hematologic/Lymphatic:   Patient denies easy bruising and swollen glands.  Cardiovascular:   Patient denies leg swelling and chest pains.  Respiratory:   Patient denies cough and shortness of breath.  Endocrine:   Patient denies excessive thirst.  Musculoskeletal:   Patient denies back pain and joint pain.  Neurological:   Patient denies headaches and dizziness.  Psychologic:   Patient denies depression and anxiety.   VITAL SIGNS:  05/24/2017 02:17 PM  Weight 192 lb / 87.09 kg  Height 70.5 in / 179.07 cm  BP 124/77 mmHg  Pulse 63 /min  Temperature 97.0 F / 36.1 C  BMI 27.2 kg/m   MULTI-SYSTEM PHYSICAL EXAMINATION:    Constitutional: Well-nourished. No physical deformities. Normally developed. Good grooming.  Neck: Neck symmetrical, not swollen.  Normal tracheal position.  Respiratory: No labored breathing, no use of accessory muscles. Normal breath sounds.  Cardiovascular: Regular rate and rhythm. No murmur, no gallop. Normal temperature, normal extremity pulses, no swelling, no varicosities.  Skin: No paleness, no jaundice, no cyanosis. No lesion, no ulcer, no rash.  Neurologic / Psychiatric: Oriented to time, oriented to place, oriented to person. No depression, no anxiety, no agitation.  Gastrointestinal: No mass, no tenderness, no rigidity, non obese abdomen.  Musculoskeletal: Normal gait and station of head and neck.    PAST DATA REVIEWED:  Source Of History:  Patient, Family/Caregiver  Lab Test Review:   PSA  Records Review:   Pathology Reports, Previous Patient Records  Urine Test Review:   Urinalysis   01/07/17  PSA  Total PSA 8.13 ng/mL   PROCEDURES:         Urinalysis Dipstick Dipstick Cont'd  Color: Yellow Bilirubin: Neg  Appearance: Clear Ketones: Neg  Specific Gravity: 1.020 Blood: Neg  pH: 7.5 Protein: Trace  Glucose: Neg Urobilinogen: 0.2    Nitrites: Neg    Leukocyte Esterase: Neg   ASSESSMENT:      ICD-10 Details  1 GU:   Prostate Cancer - C61    PLAN:          Orders Labs Urine Culture         Schedule Return Visit/Planned Activity: ASAP - PT/OT Referral         Document Letter(s):  Created for Patient: Clinical Summary        Notes:   I'll make a referral to PT/OT to discuss pelvic floor rehab s/p prostatectomy. Urine for c/s today. Strongly encouraged he keep the date to attend the prostate cancer class over at the [facility] this week. All questions answered to the best of my ability regarding upcoming procedure and understanding expressed by the patient and his wife.        Next Appointment:      Next Appointment: 06/08/2017 07:30 AM    Appointment Type: Surgery     Location: [facility] [phone]    Provider: Link Snuffer, III, M.D.    Reason for Visit: OBS WL  RALP WITH BIL PLND AMANDA     Signed by Jiles Crocker on 05/24/17 at 3:10 PM (EDT

## 2017-06-08 NOTE — Anesthesia Postprocedure Evaluation (Signed)
Anesthesia Post Note  Patient: DEMERIUS PODOLAK  Procedure(s) Performed: XI ROBOTIC ASSISTED LAPAROSCOPIC RADICAL PROSTATECTOMY (N/A ) LYMPHADENECTOMY (Bilateral )     Patient location during evaluation: PACU Anesthesia Type: General Level of consciousness: awake and alert Pain management: pain level controlled Vital Signs Assessment: post-procedure vital signs reviewed and stable Respiratory status: spontaneous breathing, nonlabored ventilation, respiratory function stable and patient connected to nasal cannula oxygen Cardiovascular status: blood pressure returned to baseline and stable Postop Assessment: no apparent nausea or vomiting Anesthetic complications: no    Last Vitals:  Vitals:   06/08/17 1437 06/08/17 1439  BP: (!) 139/91   Pulse: 79 76  Resp: 14   Temp: 36.9 C   SpO2:  100%    Last Pain:  Vitals:   06/08/17 1437  TempSrc: Oral  PainSc:                  Barnet Glasgow

## 2017-06-08 NOTE — Discharge Instructions (Signed)

## 2017-06-08 NOTE — Op Note (Signed)
Operative Note  Preoperative diagnosis:  1.  Prostate cancer   Postoperative diagnosis: 1.  Prostate cancer  Procedure(s): 1.  Robotic assisted laparoscopic prostatectomy with bilateral pelvic lymph node dissection  Surgeon: Link Snuffer, MD  Assistants: Debbrah Alar, PA--an assistant was needed due to the nature of the case being a robotic surgery requiring a bedside assistant for retraction, suctioning, exchanging instruments, etc.   Anesthesia: General   Complications: None   EBL: 100 cc   Specimens: 1. prostate and seminal vesicles 2.  Periprosthetic fat 3.  Right pelvic lymph node packet 4.  Left pelvic lymph node packet  Drains/Catheters: 1. 72 French Foley catheter and JP drain   Intraoperative findings: prostate and seminal vesicles were removed en bloc Without any evidence of gross extraprostatic disease  Indication: 72 year old male recently diagnosed with Gleason 4+3 adenocarcinoma the prostate presents for RALP with bilateral pelvic lymph node dissection.  Description of procedure:  The patient was identified and consent was obtained.  The patient was taken to the operating room and placed in the supine position.  The patient was placed under general anesthesia.  Perioperative antibiotics were administered.  The patient was placed in dorsal lithotomy.  Patient was prepped and draped in a standard sterile fashion and a timeout was performed.  The patient was placed in steep Trendelenburg.  Foley catheter was placed.  Veress needle was inserted supraumbilical and the drop test was performed with no evidence of any injury.  The abdomen was then insufflated to a pressure of 15.  4 robotic working ports, a 12 mm assistant port, and a 5 mm assistant port were placed under direct visualization in a standard fashion for a robotic pelvic surgery.  The abdomen was inspected and there was no evidence of any visceral or vascular injury.  I first released colonic adhesions in  the left lower quadrant.  I then retracted the sigmoid and rectum superiorly.  I first started with the posterior dissection by incising along the posterior peritoneum and identifying bilateral vas deferens.  These were divided and bilateral seminal vesicles were carefully dissected out.  Denonvier fascia was then entered posteriorly.  The bladder was then dropped by incising along the medial umbilical ligament bilaterally.  Periprosthetic fat was cleared off the prostate and passed off and passed off for specimen.  The endopelvic fascia was incised bilaterally and the puboprostatic ligaments were released.  A stapler with a vascular staple load was used to staple the dorsal venous complex.  I then incised along into the bladder neck and divided the bladder neck.  The catheter was brought out and used for retraction.  I divided the remainder of the bladder neck and then pulled the seminal vesicles out of this incision.  Careful dissection was performed and bilateral prostatic pedicles were released using Hem-o-lok clips and sharp dissection.  Spot cautery was sparingly used.  Periprosthetic tissue was carefully released inferiorly and laterally, performing a non-nerve sparing operation bilaterally.  Once only the urethra remained attached, the anterior portion of the urethra was divided.  A 2-0 Vicryl stitch was placed at the 6 o'clock position.  The remainder of the urethra was divided and the prostate was placed in a specimen bag.  FloSeal was applied to the prostatectomy bed.  I then performed the bilateral pelvic lymph node dissection.  I first carefully removed lymph node packet from the right side overlying the external iliac artery and vein down to the level of the obturator nerve.  This was passed  off for specimen and then I carefully removed the left sided pelvic lymph node packets again overlying the external iliac artery and vein down to the level of the obturator nerve taking care not to injure any  vital structures.  The 2-0 Vicryl stitch was used to reapproximate the bladder and urethra at the 6 o'clock position.  A running 3-0 V lock suture was then used to reapproximate the bladder and urethra.  The bladder was filled with normal saline and there was no evidence of any leak at the anastomosis.  The anastomosis was watertight.  The Foley catheter was exchanged for a fresh catheter.  A JP drain was inserted from the left lateral port site.  This was secured down with a nylon stitch.  The robot was undocked and the patient was taken out of Trendelenburg.  All working ports were removed under direct visualization with the camera to ensure there was no bleeding from the sites.  The midline port incision was extended and the prostate extracted in the specimen bag.  Interrupted figure-of-eight 0 Vicryl sutures were used to close  the fascia.  The 12 mm assistant port fascia was also closed with a 2-0 Vicryl.  Skin was then closed with 4-0 Monocryl and Dermabond.  Exparel was used for anesthetic effect.  This concluded the operation.  The patient tolerated procedure well and was stable postoperatively.  Plan: Will obtain stat labs.  He will remain in the hospital overnight and hopefully be able to be discharged tomorrow.  He will keep his catheter for 7-10 days.

## 2017-06-08 NOTE — Interval H&P Note (Signed)
History and Physical Interval Note:  06/08/2017 7:11 AM  Joseph Golden  has presented today for surgery, with the diagnosis of PROSTATE CANCER  The various methods of treatment have been discussed with the patient and family. After consideration of risks, benefits and other options for treatment, the patient has consented to  Procedure(s): XI ROBOTIC Harris (N/A) LYMPHADENECTOMY (Bilateral) as a surgical intervention .  The patient's history has been reviewed, patient examined, no change in status, stable for surgery.  I have reviewed the patient's chart and labs.  Questions were answered to the patient's satisfaction.     Marton Redwood, III

## 2017-06-08 NOTE — Anesthesia Procedure Notes (Signed)
Procedure Name: Intubation Date/Time: 06/08/2017 7:34 AM Performed by: Glory Buff, CRNA Pre-anesthesia Checklist: Patient identified, Emergency Drugs available, Suction available and Patient being monitored Patient Re-evaluated:Patient Re-evaluated prior to induction Oxygen Delivery Method: Circle system utilized Preoxygenation: Pre-oxygenation with 100% oxygen Induction Type: IV induction Ventilation: Mask ventilation without difficulty Laryngoscope Size: Miller and 3 Grade View: Grade I Tube type: Oral Tube size: 7.5 mm Number of attempts: 1 Airway Equipment and Method: Stylet and Oral airway Placement Confirmation: ETT inserted through vocal cords under direct vision,  positive ETCO2 and breath sounds checked- equal and bilateral Secured at: 21 cm Tube secured with: Tape Dental Injury: Teeth and Oropharynx as per pre-operative assessment

## 2017-06-08 NOTE — Transfer of Care (Signed)
Immediate Anesthesia Transfer of Care Note  Patient: Joseph Golden  Procedure(s) Performed: XI ROBOTIC ASSISTED LAPAROSCOPIC RADICAL PROSTATECTOMY (N/A ) LYMPHADENECTOMY (Bilateral )  Patient Location: PACU  Anesthesia Type:General  Level of Consciousness: sedated  Airway & Oxygen Therapy: Patient Spontanous Breathing and Patient connected to face mask oxygen  Post-op Assessment: Report given to RN and Post -op Vital signs reviewed and stable  Post vital signs: Reviewed and stable  Last Vitals:  Vitals Value Taken Time  BP    Temp    Pulse    Resp    SpO2      Last Pain:  Vitals:   06/08/17 0544  TempSrc: Oral      Patients Stated Pain Goal: 4 (96/78/93 8101)  Complications: No apparent anesthesia complications

## 2017-06-09 ENCOUNTER — Encounter (HOSPITAL_COMMUNITY): Payer: Self-pay | Admitting: Urology

## 2017-06-09 DIAGNOSIS — C61 Malignant neoplasm of prostate: Secondary | ICD-10-CM | POA: Diagnosis not present

## 2017-06-09 LAB — BASIC METABOLIC PANEL
Anion gap: 9 (ref 5–15)
BUN: 13 mg/dL (ref 6–20)
CO2: 23 mmol/L (ref 22–32)
CREATININE: 1.25 mg/dL — AB (ref 0.61–1.24)
Calcium: 8.4 mg/dL — ABNORMAL LOW (ref 8.9–10.3)
Chloride: 107 mmol/L (ref 101–111)
GFR, EST NON AFRICAN AMERICAN: 57 mL/min — AB (ref >60)
Glucose, Bld: 147 mg/dL — ABNORMAL HIGH (ref 65–99)
Potassium: 3.7 mmol/L (ref 3.5–5.1)
SODIUM: 139 mmol/L (ref 135–145)

## 2017-06-09 LAB — HIV ANTIBODY (ROUTINE TESTING W REFLEX): HIV SCREEN 4TH GENERATION: NONREACTIVE

## 2017-06-09 LAB — HEMOGLOBIN AND HEMATOCRIT, BLOOD
HEMATOCRIT: 41.7 % (ref 39.0–52.0)
HEMOGLOBIN: 14.1 g/dL (ref 13.0–17.0)

## 2017-06-09 NOTE — Progress Notes (Signed)
Report received from K. Marvel Plan, RN. Received and agree with previous documentation. No new changes noted at this time. Will continue to monitor closely throughout the night.

## 2017-06-09 NOTE — Progress Notes (Signed)
Notified MD of JP output and the output around JP drain. Will continue with discharge patient  and d/c the JP drain.

## 2017-06-13 NOTE — Discharge Summary (Signed)
Physician Discharge Summary  Patient ID: Joseph Golden MRN: [id number] DOB/AGE: 1945/06/13 72 y.o.  Admit date: 06/08/2017 Discharge date: 06/09/2017  Admission Diagnoses:  Discharge Diagnoses:  Active Problems:   Prostate cancer [facility])   Discharged Condition: good  Hospital Course: patient underwent robotic system laparoscopic prostatectomy on 06/08/2017.  He did well postoperatively and the following day his JP was removed, he was ambulating, pain was well controlled, tolerating diet.  Consults: None  Significant Diagnostic Studies: none  Treatments: surgery: RALP w/ b/l PLND  Discharge Exam: Blood pressure 128/81, pulse 81, temperature 99.9 F (37.7 C), temperature source Oral, resp. rate 16, height 5' 10.5" (1.791 m), weight 86.6 kg (191 lb), SpO2 100 %. General appearance: alert, NAD Adequate peripheral perfusion Nonlabored respiration Abdomen soft nontender nondistended.  Incisions clean dry and intact. JP was scant serosanguineous fluid Foley catheter with clear yellow urine  Disposition:    Allergies as of 06/09/2017   No Known Allergies     Medication List    STOP taking these medications   aspirin 325 MG tablet     TAKE these medications   amLODipine 10 MG tablet Commonly known as:  NORVASC Take 1 tablet (10 mg total) by mouth daily.   carvedilol 6.25 MG tablet Commonly known as:  COREG Take 1 tablet (6.25 mg total) 2 (two) times daily with a meal by mouth.   HYDROcodone-acetaminophen 5-325 MG tablet Commonly known as:  NORCO Take 1-2 tablets by mouth every 6 (six) hours as needed for moderate pain or severe pain.   losartan-hydrochlorothiazide 50-12.5 MG tablet Commonly known as:  HYZAAR Take 1 tablet by mouth daily.      Follow-up Information    Lucas Mallow, MD On 06/16/2017.   Specialty:  Urology Why:  at 10:45 Contact information: [place] [address] [phone]           Signed: Marton Redwood,  III 06/13/2017, 5:25 PM

## 2017-07-29 ENCOUNTER — Encounter: Payer: Self-pay | Admitting: Radiation Oncology

## 2017-08-16 NOTE — Progress Notes (Signed)
GU Location of Tumor / Histology: prostatic adenocarcinoma  If Prostate Cancer, Gleason Score is (4 + 3) and PSA is (8.13). Prostate volume: 35 cc  Joseph Golden was referred by his PCP, Dr. Kathlene November, in December 2018 for further evaluation of an elevated PSA.   01-07-17       PSA   8.77 11-18-16       PSA   8.13 07-21-17       PSA  0.11    Biopsies of prostate (if applicable) revealed:    Past/Anticipated interventions by urology, if any: prostate biopsy   FINAL DIAGNOSIS Diagnosis 06-08-17 Dr. Link Snuffer 1. Fatty tissue, periprostatic BENIGN FIBROADIPOSE TISSUE 2. 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 Lymph nodes, regional resection, right pelvic THREE BENIGN LYMPH NODES (0/3) 4. Lymph nodes, regional resection, left pelvic FOUR BENIGN LYMPH NODES (0/4) Microscopic Comment 2. PROSTATE GLAND: Procedure: Radical Prostatectomy Histologic Type: Acinar Histologic Grade, Grade Group and Gleason Score: 4   Past/Anticipated interventions by medical oncology, if any: no  Weight changes, if any: yes, -8 lb since May 2019 "states he is gaining his weight back since his surgery  Bowel/Bladder complaints, if any: IPSS  2  with nocturia x 1. Denies dysuria, hematuria, leakage or incontinence.   Nausea/Vomiting, if any: No  Pain issues, if any:  Reports left hip pain x 4-5 years. Reports pain is unchanged and flares with increased activity.   SAFETY ISSUES:  Prior radiation? No  Pacemaker/ICD? No  Possible current pregnancy? N/A  Is the patient on methotrexate? No  Current Complaints / other details:  72 year old male. Married with one son and four daughters. Married x 46 years.  Wt Readings from Last 3  Encounters:  08/24/17 183 lb (83 kg)  06/08/17 191 lb (86.6 kg)  05/30/17 191 lb (86.6 kg)  BP 137/81 (BP Location: Right Arm, Patient Position: Sitting)   Pulse 74   Temp 97.7 F (36.5 C) (Oral)   Resp 20   Ht 5' 10.5" (1.791 m)   Wt 183 lb (83 kg)   SpO2 100%   BMI 25.89 kg/m

## 2017-08-23 NOTE — Progress Notes (Signed)
Radiation Oncology         [phone] ________________________________  Follow Up New Outpatient Visit  Name: Joseph Golden MRN: [id number]  Date: 08/24/2017  DOB: 27-Apr-1945  CC:Paz, Alda Berthold, MD  Lucas Mallow, MD   REFERRING PHYSICIAN: Lucas Mallow, MD  DIAGNOSIS: 72 y.o. gentleman with detectable PSA of 0.11 s/p recent  RALP for treatment of pT3b,N0Mx, Gleason 4+5 adenocarcinoma of the prostate with negative surgical margins but evidence of extraprostatic extension bilaterally at the base, seminal vesicle invasion and lymphovascular invasion.  Pre-op PSA was 8.13    ICD-10-CM   1. Prostate cancer [facility]) C61     HISTORY OF PRESENT ILLNESS: Joseph Golden is a 72 y.o. male with a diagnosis of prostate cancer. He was initially noted to have an elevated PSA of 8.77 in November 2018 by his primary care physician, Dr. Kathlene November.  Accordingly, he was referred for evaluation in urology to Dr. Gloriann Loan on 01/07/2017, where a digital rectal examination was performed at that time revealing no prostate nodularity. His PSA was rechecked and remained elevated at 8.13.  The patient proceeded to transrectal ultrasound with 12 biopsies of the prostate on 02/03/2017.  The prostate volume measured 35.16 cc.  Out of 12 core biopsies, 7 were positive. The maximum Gleason score was 4+3, and this was seen in left base lateral, left mid lateral, left mid, right base, and right base lateral.    We originally met the patient on 03/07/17 following his diagnosis to discuss treatment options.  At that time, he opted to proceed with surgical management of his prostate cancer and underwent RALP with BPLND with Dr. Gloriann Loan on 06/08/17.  Final pathology revealed pT3b,N0Mx, Gleason 4+5 adenocarcinoma of the prostate with negative surgical margins but evidence of extraprostatic extension bilaterally at the base with seminal vesicle invasion and lymphovascular invasion.  There was no evidence of lymph node involvement in the 7  nodes removed.  His initial postoperative PSA on 07/21/2017 remained detectable at 0.11.  He has made good progress in regaining urinary control, currently only requiring 1 protective pad per day for security.  PREVIOUS RADIATION THERAPY: No  PAST MEDICAL HISTORY:  Past Medical History:  Diagnosis Date  . Allergy    SEASONAL  . Diverticulosis 11/16/2012  . Helicobacter pylori gastritis 04/23/2013  . History of hiatal hernia   . HTN (hypertension)   . 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 Other and unspecified hyperlipidemia 10/31/2012  . Personal history of colonic adenoma 12/30/2011   12/2011 - 12 mm adenoma with high-grade dysplasia  . Prostate cancer ([place]) 02/2017      PAST SURGICAL HISTORY: Past Surgical History:  Procedure Laterality Date  . COLONOSCOPY  2013  . LYMPHADENECTOMY Bilateral 06/08/2017   Procedure: LYMPHADENECTOMY;  Surgeon: Lucas Mallow, MD;  Location: [facility];  Service: Urology;  Laterality: Bilateral;  . NO PAST SURGERIES    . PROSTATE BIOPSY    . ROBOT ASSISTED LAPAROSCOPIC RADICAL PROSTATECTOMY N/A 06/08/2017   Procedure: XI ROBOTIC ASSISTED LAPAROSCOPIC RADICAL PROSTATECTOMY;  Surgeon: Lucas Mallow, MD;  Location: [facility];  Service: Urology;  Laterality: N/A;    FAMILY HISTORY:  Family History  Problem Relation Age of Onset  . Hypertension Mother   . Breast cancer Mother   . Brain cancer Father 20  . Stomach cancer Other   . Hypertension Sister   . Hypertension Brother   .  CVA Maternal Uncle   . Colon cancer Neg Hx   . Esophageal cancer Neg Hx   . Rectal cancer Neg Hx   . Prostate cancer Neg Hx   . Diabetes Neg Hx   . CAD Neg Hx     SOCIAL HISTORY:  Social History   Socioeconomic History  . Marital status: Married    Spouse name: Not on file  . Number of children: 5  . Years of education: Not on file  . Highest education level: Not on file  Occupational  History  . Occupation: retired   Scientific laboratory technician  . Financial resource strain: Not on file  . Food insecurity:    Worry: Not on file    Inability: Not on file  . Transportation needs:    Medical: Not on file    Non-medical: Not on file  Tobacco Use  . Smoking status: Former Smoker    Types: Pipe    Last attempt to quit: 01/11/2006    Years since quitting: 11.6  . Smokeless tobacco: Never Used  . Tobacco comment: quit ~ 2005, smoked pipe   Substance and Sexual Activity  . Alcohol use: Yes    Alcohol/week: 2.0 standard drinks    Types: 2 Cans of beer per week    Comment: social  . Drug use: No  . Sexual activity: Not Currently  Lifestyle  . Physical activity:    Days per week: Not on file    Minutes per session: Not on file  . Stress: Not on file  Relationships  . Social connections:    Talks on phone: Not on file    Gets together: Not on file    Attends religious service: Not on file    Active member of club or organization: Not on file    Attends meetings of clubs or organizations: Not on file    Relationship status: Not on file  . Intimate partner violence:    Fear of current or ex partner: Not on file    Emotionally abused: Not on file    Physically abused: Not on file    Forced sexual activity: Not on file  Other Topics Concern  . Not on file  Social History Narrative   Married for 46 years. Lives in [place]. One son and four daughters.   08-24-17 Unable to ask today wife with him  The patient is retired from working in [employer]. He graduated from [employer].   ALLERGIES: Patient has no known allergies.  MEDICATIONS:  Current Outpatient Medications  Medication Sig Dispense Refill  . amLODipine (NORVASC) 10 MG tablet Take 1 tablet (10 mg total) by mouth daily. 90 tablet 3  . carvedilol (COREG) 6.25 MG tablet Take 1 tablet (6.25 mg total) 2 (two) times daily with a meal by mouth. 180 tablet 3  . losartan-hydrochlorothiazide (HYZAAR) 50-12.5 MG tablet Take 1  tablet by mouth daily. 90 tablet 3   No current facility-administered medications for this encounter.     REVIEW OF SYSTEMS:  On review of systems, the patient reports that he is doing well overall. He denies any chest pain, shortness of breath, cough, fevers, chills, or night sweats. He reports a 10-pound unintended weight loss in the past month; reports he has been very active at church lately. He denies any bowel disturbances, and denies abdominal pain, nausea or vomiting. He reports left hip pain over the past 4-5 years; reports the pain is unchanged and flares with increased activity. His  IPSS was 1, indicating mild urinary symptoms with nocturia. He denies any dysuria, hematuria, leakage or incontinence. He is infrequently able to complete sexual activity with most attempts. A complete review of systems is obtained and is otherwise negative.    PHYSICAL EXAM:  Wt Readings from Last 3 Encounters:  08/24/17 183 lb (83 kg)  06/08/17 191 lb (86.6 kg)  05/30/17 191 lb (86.6 kg)   Temp Readings from Last 3 Encounters:  08/24/17 97.7 F (36.5 C) (Oral)  06/09/17 99.9 F (37.7 C) (Oral)  05/30/17 98 F (36.7 C) (Oral)   BP Readings from Last 3 Encounters:  08/24/17 137/81  06/09/17 128/81  05/30/17 123/79   Pulse Readings from Last 3 Encounters:  08/24/17 74  06/09/17 81  05/30/17 (!) 59   Pain Assessment Pain Score: 0-No pain/10  In general this is a well appearing African-American male in no acute distress. He is alert and oriented x4 and appropriate throughout the examination. HEENT reveals that the patient is normocephalic, atraumatic. EOMs are intact. PERRLA. Skin is intact without any evidence of gross lesions. Cardiovascular exam reveals a regular rate and rhythm, no clicks rubs or murmurs are auscultated. Chest is clear to auscultation bilaterally. Lymphatic assessment is performed and does not reveal any adenopathy in the supraclavicular, axillary, or inguinal chains.  Abdomen has active bowel sounds in all quadrants and is intact. The abdomen is soft, non tender, non distended. Lower extremities are negative for pretibial pitting edema, deep calf tenderness, cyanosis or clubbing.   KPS = 90  100 - Normal; no complaints; no evidence of disease. 90   - Able to carry on normal activity; minor signs or symptoms of disease. 80   - Normal activity with effort; some signs or symptoms of disease. 69   - Cares for self; unable to carry on normal activity or to do active work. 60   - Requires occasional assistance, but is able to care for most of his personal needs. 50   - Requires considerable assistance and frequent medical care. 62   - Disabled; requires special care and assistance. 18   - Severely disabled; hospital admission is indicated although death not imminent. 66   - Very sick; hospital admission necessary; active supportive treatment necessary. 10   - Moribund; fatal processes progressing rapidly. 0     - Dead  Karnofsky DA, Abelmann [place], Craver LS and Burchenal [facility] [phone]) The use of the nitrogen mustards in the palliative treatment of carcinoma: with particular reference to bronchogenic carcinoma Cancer 1 634-56  LABORATORY DATA:  Lab Results  Component Value Date   WBC 5.1 05/30/2017   HGB 14.1 06/09/2017   HCT 41.7 06/09/2017   MCV 91.1 05/30/2017   PLT 231 05/30/2017   Lab Results  Component Value Date   NA 139 06/09/2017   K 3.7 06/09/2017   CL 107 06/09/2017   CO2 23 06/09/2017   Lab Results  Component Value Date   ALT 11 11/18/2016   AST 14 11/18/2016   ALKPHOS 57 11/18/2016   BILITOT 0.5 11/18/2016     RADIOGRAPHY: No results found.    IMPRESSION/PLAN: 1. 72 y.o. gentleman with a detectable PSA of 0.11 s/p recent  RALP for treatment of pT3b,N0Mx, Gleason 4+5 adenocarcinoma of the prostate with negative surgical margins but evidence of extraprostatic extension bilaterally at the base, seminal vesicle invasion and lymphovascular  invasion.  Pre-treatment PSA was 8.13. Today we reviewed the findings and workup thus far.  We discussed  the natural history of prostate cancer.  We reviewed the the implications of positive margins, extracapsular extension, and seminal vesicle involvement on the risk of prostate cancer recurrence, especially in light of Gleason 4+5 disease with detectable PSA postoperatively. We reviewed some of the evidence suggesting an advantage for patients who undergo adjuvant radiotherapy in the setting in terms of disease control and overall survival. We also discussed some of the dilemmas related to the available evidence.  We discussed the SWOG trial which did show an improvement in disease-free survival as well as overall survival using adjuvant radiotherapy. However, we discussed the fact that the study did not carefully control the usage of adjuvant radiotherapy in the observation arm. There is increasing evidence that careful surveillance with ultrasensitive PSA may provide an opportunity for early salvage in patients who undergo observation, which can lead to excellent results in terms of disease control and survival. We discussed radiation treatment directed to the prostatic fossa with regard to the logistics and delivery of external beam radiation treatment.  The recommendation is for a 7.5 week course of daily radiotherapy treatments in combination with ST-ADT, given his high risk disease with adverse features detected on final surgical pathology.  We detailed the role of ADT in the treatment of high risk prostate cancer and outlined the associated side effects that could be expected with this therapy.  At the conclusion of our conversation, the patient would like to proceed with adjuvant radiotherapy to the prostate fossa in combination with ADT as recommended.  We will share our findings with Dr. Gloriann Loan and arrange for a follow up visit to initiate ADT in the near future followed by [facility] for treatment  planning.     We enjoyed meeting with him and his wife today, and will look forward to participating in the care of this very nice gentleman.   We spent 60 minutes face to face with the patient and more than 50% of that time was spent in counseling and/or coordination of care.     Nicholos Johns, PA-C    Tyler Pita, MD  [facility] Direct Dial: [phone]  Fax: [phone] [email or web address]  Skype  [email or web address]   References:  JAMA. 2006 Nov 15;296(19):2329-35.  Adjuvant radiotherapy for pathologically advanced prostate cancer: a randomized clinical trial.  Grandville Silos IM Jr(1), Tangen CM, Hettie Holstein MS, Ova Freshwater, Messing Wayland Denis ED.  Author information: (1)Department of Urology, [facility] [place], [place], [address] [place] [address], [place]. [email or web address]  CONTEXT: Despite a stage-shift to earlier cancer stages and lower tumor volumes for prostate cancer, pathologically advanced disease is detected at radical prostatectomy in 38% to 52% of patients. However, the optimal management of these patients after radical prostatectomy is unknown.  OBJECTIVE: To determine whether adjuvant radiotherapy improves metastasis-free survival in patients with stage pT3 N0 M0 prostate cancer.  DESIGN, SETTING, AND PATIENTS: Randomized, prospective, multi-institutional, [place] clinical trial with enrollment between August 26, 1986, and January 12, 1995 (with database frozen for statistical analysis on October 02, 2003). Patients were 27 men with pathologically advanced prostate cancer who had undergone radical prostatectomy. INTERVENTION: Men were randomly assigned to receive 60 to 64 Gy of external beam radiotherapy delivered to the prostatic fossa (n = 214) or usual care plus observation (n = 211).  MAIN OUTCOME MEASURES: Primary outcome was metastasis-free  survival, defined as time to first  occurrence of metastatic disease or death due to any cause. Secondary outcomes included prostate-specific antigen (PSA) relapse, recurrence-free survival, overall survival, freedom from hormonal therapy, and postoperative complications.  RESULTS: Among the 425 men, median follow-up was 10.6 years (interquartile range, 9.2-12.7 years). For metastasis-free survival, 76 (35.5%) of 214 men in the adjuvant radiotherapy group were diagnosed with metastatic disease or died (median metastasis-free estimate, 14.7 years), compared with 91 (43.1%) of 211 (median metastasis-free estimate, 13.2 years) of those in the observation group (hazard ratio [HR], 0.75; 95% CI, 0.55-1.02; P = .06). There were no significant between-group differences for overall survival (71 deaths, median survival of 14.7 years for radiotherapy vs 83 deaths, median survival of 13.8 years for observation; HR, 0.80; 95% CI, 0.58-1.09; P = .16). PSA relapse (median PSA relapse-free survival, 10.3 years for radiotherapy vs 3.1 years for observation; HR, 0.43; 95% CI, 0.31-0.58; P<.001) and disease recurrence (median recurrence-free survival, 13.8 years for radiotherapy vs 9.9 years for observation; HR, 0.62; 95% CI, 0.46-0.82; P = .001) were both significantly reduced with radiotherapy. Adverse effects were more common with radiotherapy vs observation (23.8% vs 11.9%), including rectal complications (7.5% vs 0%), urethral strictures (17.8% vs 9.5%), and total urinary incontinence (6.5% vs 2.8%).  CONCLUSIONS: In men who had undergone radical prostatectomy for pathologically advanced prostate cancer, adjuvant radiotherapy resulted in significantly reduced risk of PSA relapse and disease recurrence, although the improvements in metastasis-free survival and overall survival were not statistically significant.  Trial Registration clinicaltrials.gov Identifier: [id number]. PMID: [id number]   J Clin Oncol. 2007 Jun  1;25(16):2225-9. Predominant treatment failure in postprostatectomy patients is local: analysis of patterns of treatment failure in SWOG 8794.  Swanson GP(1), [place] MA, Tangen CM, Chin J, Messing E, Canby-Hagino Daiva Eves, Doy Hutching ED;  Author information: (1)Department of Radiation Oncology and Urology, [facility], [place] [address], [place]. [email or web address] Comment in J Clin Oncol. 2007 Dec 10;25(35):5671-2.  PURPOSE: [facility] (SWOG) trial [id number] demonstrated that adjuvant radiation reduces the risk of biochemical (prostate-specific antigen [PSA]) treatment failure by 50% over radical prostatectomy alone. In this analysis, we stratified patients as to their preradiation PSA levels and correlated it with outcomes such as PSA treatment failure, local recurrence, and distant failure, to serve as guidelines for future research.  PATIENTS AND METHODS: Four hundred thirty-one subjects with pathologically advanced prostate cancer (extraprostatic extension, positive surgical margins, or seminal vesicle invasion) were randomly assigned to adjuvant radiotherapy or observation.  RESULTS: Three hundred seventy-four eligible patients had immediate postprostatectomy and follow-up PSA data. Median follow-up was 10.2 years. For patients with a postsurgical PSA of 0.2 ng/mL, radiation was associated with reductions in the 10-year risk of biochemical treatment failure (72% to 42%), local failures (20% to 7%), and distant failures (12% to 4%). For patients with a postsurgical PSA between higher than 0.2 and <or = 1.0 ng/mL, reductions in the 10-year risk of biochemical failure (80% to 73%), local failures (25% to 9%), and distant failures (16% to 12%) were realized. In patients with postsurgical PSA higher than 1.0, the respective findings were 94% versus 100%, 28% versus 9%, and 44% versus 18%.  CONCLUSION: The pattern of treatment failure in high-risk  patients is predominantly local with a surprisingly low incidence of metastatic failure. Adjuvant radiation to the prostate bed reduces the risk of metastatic disease and biochemical failure at all postsurgical PSA levels. Further improvement in reducing local treatment failure is likely to have the greatest impact on outcome in  high-risk patients after prostatectomy.  PMID: [id number]     J Urol. 2009 Mar;181(3):956-62.  Adjuvant radiotherapy for pathological T3N0M0 prostate cancer significantly reduces risk of metastases and improves survival: long-term followup of a randomized clinical trial.  Grandville Silos IM(1), Tangen CM, Hettie Holstein MS, Ova Freshwater, Messing Wayland Denis ED.  Chief Strategy Officer information: (1)[employer] [facility] [place], [place], [place], [place]. PURPOSE: Extraprostatic disease will be manifest in a third of men after radical prostatectomy. We present the long-term followup of a randomized clinical trial of radiotherapy to reduce the risk of subsequent metastatic disease and death.  MATERIALS AND METHODS: A total of 431 men with pT3N0M0 prostate cancer were randomized to 60 to 64 Gy adjuvant radiotherapy or observation. The primary study end point was metastasis-free survival.  RESULTS: Of 425 eligible men 211 were randomized to observation and 214 to adjuvant radiation. Of those men under observation 70 ultimately received radiotherapy. Metastasis-free survival was significantly greater with radiotherapy (93 of 214 events on the radiotherapy arm vs 114 of 211 events on observation; HR 0.71; 95% CI 0.54, 0.94; p = 0.016). Survival improved significantly with adjuvant radiation (88 deaths of 214 on the radiotherapy arm vs 110 deaths of 211 on observation; HR 0.72; 95% CI 0.55, 0.96; p = 0.023).  CONCLUSIONS: Adjuvant radiotherapy after radical prostatectomy for a man with pT3N0M0 prostate cancer significantly reduces  the risk of metastasis and increases survival.  PMCID: [id number] PMID: [id number]

## 2017-08-24 ENCOUNTER — Other Ambulatory Visit: Payer: Self-pay

## 2017-08-24 ENCOUNTER — Ambulatory Visit
Admission: RE | Admit: 2017-08-24 | Discharge: 2017-08-24 | Disposition: A | Discharge status: Home / Self Care | Payer: Medicare Other | Source: Ambulatory Visit | Attending: Urology | Admitting: Urology

## 2017-08-24 ENCOUNTER — Encounter: Payer: Self-pay | Admitting: Urology

## 2017-08-24 ENCOUNTER — Ambulatory Visit
Admission: RE | Admit: 2017-08-24 | Discharge: 2017-08-24 | Disposition: A | Discharge status: Home / Self Care | Payer: Medicare Other | Source: Ambulatory Visit | Attending: Radiation Oncology | Admitting: Radiation Oncology

## 2017-08-24 VITALS — BP 137/81 | HR 74 | Temp 97.7°F | Resp 20 | Ht 70.5 in | Wt 183.0 lb

## 2017-08-24 DIAGNOSIS — C61 Malignant neoplasm of prostate: Secondary | ICD-10-CM | POA: Insufficient documentation

## 2017-08-24 DIAGNOSIS — I10 Essential (primary) hypertension: Secondary | ICD-10-CM | POA: Insufficient documentation

## 2017-08-24 DIAGNOSIS — Z87891 Personal history of nicotine dependence: Secondary | ICD-10-CM | POA: Insufficient documentation

## 2017-08-24 DIAGNOSIS — Z79899 Other long term (current) drug therapy: Secondary | ICD-10-CM | POA: Diagnosis not present

## 2017-08-25 ENCOUNTER — Encounter: Payer: Self-pay | Admitting: Medical Oncology

## 2017-10-04 ENCOUNTER — Telehealth: Payer: Self-pay | Admitting: *Deleted

## 2017-10-04 NOTE — Telephone Encounter (Signed)
PATIENT TO HAVE ADT ON 10-17-17 @ 9:30 AM @ [facility]

## 2017-10-06 ENCOUNTER — Telehealth: Payer: Self-pay | Admitting: *Deleted

## 2017-10-06 NOTE — Telephone Encounter (Signed)
Called patient to inform of ADT Inj. for 10-17-17 - arrival time - 9:30 am @ [facility] and his sim on 10-28-17 - 10 am @ Dr. Johny Shears Office, spoke with patient and he is aware of these appts.

## 2017-10-20 LAB — POCT INR: INR: 0.4 — AB (ref <=1.1)

## 2017-10-28 ENCOUNTER — Encounter: Payer: Self-pay | Admitting: Medical Oncology

## 2017-10-28 ENCOUNTER — Ambulatory Visit
Admission: RE | Admit: 2017-10-28 | Discharge: 2017-10-28 | Disposition: A | Discharge status: Home / Self Care | Payer: Medicare Other | Source: Ambulatory Visit | Attending: Radiation Oncology | Admitting: Radiation Oncology

## 2017-10-28 DIAGNOSIS — Z510 Encounter for antineoplastic radiation therapy: Secondary | ICD-10-CM | POA: Insufficient documentation

## 2017-10-28 DIAGNOSIS — C61 Malignant neoplasm of prostate: Secondary | ICD-10-CM

## 2017-10-28 NOTE — Progress Notes (Signed)
  Radiation Oncology         [phone] ________________________________  Name: Joseph Golden MRN: [id number]  Date: 10/28/2017  DOB: 08-12-1945  SIMULATION AND TREATMENT PLANNING NOTE    ICD-10-CM   1. Prostate cancer [facility]) C61     DIAGNOSIS:  72 y.o. gentleman with detectable PSA of 0.11 s/p recent  RALP for treatment of pT3b,N0Mx, Gleason 4+5 adenocarcinoma of the prostate with negative surgical margins but evidence of extraprostatic extension bilaterally at the base, seminal vesicle invasion and lymphovascular invasion  NARRATIVE:  The patient was brought to the [facility].  Identity was confirmed.  All relevant records and images related to the planned course of therapy were reviewed.  The patient freely provided informed written consent to proceed with treatment after reviewing the details related to the planned course of therapy. The consent form was witnessed and verified by the simulation staff.  Then, the patient was set-up in a stable reproducible supine position for radiation therapy.  A vacuum lock pillow device was custom fabricated to position his legs in a reproducible immobilized position.  Then, I performed a urethrogram under sterile conditions to identify the prostatic bed.  CT images were obtained.  Surface markings were placed.  The CT images were loaded into the planning software.  Then the prostate bed target, pelvic lymph node target and avoidance structures including the rectum, bladder, bowel and hips were contoured.  Treatment planning then occurred.  The radiation prescription was entered and confirmed.  A total of one complex treatment devices were fabricated. I have requested : Intensity Modulated Radiotherapy (IMRT) is medically necessary for this case for the following reason:  Rectal sparing.Marland Kitchen  PLAN:  The patient will receive 45 Gy in 25 fractions of 1.8 Gy, followed by a boost to the prostate bed to a total dose of 68.4 Gy with 13 additional  fractions of 1.8 Gy.   ________________________________  Sheral Apley Tammi Klippel, M.D.

## 2017-11-03 ENCOUNTER — Encounter: Payer: Self-pay | Admitting: Internal Medicine

## 2017-11-07 DIAGNOSIS — Z510 Encounter for antineoplastic radiation therapy: Secondary | ICD-10-CM | POA: Diagnosis not present

## 2017-11-09 ENCOUNTER — Encounter: Payer: Self-pay | Admitting: Medical Oncology

## 2017-11-09 ENCOUNTER — Ambulatory Visit
Admission: RE | Admit: 2017-11-09 | Discharge: 2017-11-09 | Disposition: A | Discharge status: Home / Self Care | Payer: Medicare Other | Source: Ambulatory Visit | Attending: Radiation Oncology | Admitting: Radiation Oncology

## 2017-11-09 DIAGNOSIS — Z510 Encounter for antineoplastic radiation therapy: Secondary | ICD-10-CM | POA: Diagnosis not present

## 2017-11-10 ENCOUNTER — Ambulatory Visit
Admission: RE | Admit: 2017-11-10 | Discharge: 2017-11-10 | Disposition: A | Discharge status: Home / Self Care | Payer: Medicare Other | Source: Ambulatory Visit | Attending: Radiation Oncology | Admitting: Radiation Oncology

## 2017-11-10 DIAGNOSIS — Z510 Encounter for antineoplastic radiation therapy: Secondary | ICD-10-CM | POA: Diagnosis not present

## 2017-11-11 ENCOUNTER — Ambulatory Visit
Admission: RE | Admit: 2017-11-11 | Discharge: 2017-11-11 | Disposition: A | Discharge status: Home / Self Care | Payer: Medicare Other | Source: Ambulatory Visit | Attending: Radiation Oncology | Admitting: Radiation Oncology

## 2017-11-11 DIAGNOSIS — Z510 Encounter for antineoplastic radiation therapy: Secondary | ICD-10-CM | POA: Insufficient documentation

## 2017-11-11 DIAGNOSIS — C61 Malignant neoplasm of prostate: Secondary | ICD-10-CM | POA: Diagnosis not present

## 2017-11-14 ENCOUNTER — Ambulatory Visit
Admission: RE | Admit: 2017-11-14 | Discharge: 2017-11-14 | Disposition: A | Discharge status: Home / Self Care | Payer: Medicare Other | Source: Ambulatory Visit | Attending: Radiation Oncology | Admitting: Radiation Oncology

## 2017-11-14 DIAGNOSIS — Z510 Encounter for antineoplastic radiation therapy: Secondary | ICD-10-CM | POA: Diagnosis not present

## 2017-11-15 ENCOUNTER — Ambulatory Visit
Admission: RE | Admit: 2017-11-15 | Discharge: 2017-11-15 | Disposition: A | Discharge status: Home / Self Care | Payer: Medicare Other | Source: Ambulatory Visit | Attending: Radiation Oncology | Admitting: Radiation Oncology

## 2017-11-15 DIAGNOSIS — Z510 Encounter for antineoplastic radiation therapy: Secondary | ICD-10-CM | POA: Diagnosis not present

## 2017-11-16 ENCOUNTER — Ambulatory Visit
Admission: RE | Admit: 2017-11-16 | Discharge: 2017-11-16 | Disposition: A | Discharge status: Home / Self Care | Payer: Medicare Other | Source: Ambulatory Visit | Attending: Radiation Oncology | Admitting: Radiation Oncology

## 2017-11-16 DIAGNOSIS — Z510 Encounter for antineoplastic radiation therapy: Secondary | ICD-10-CM | POA: Diagnosis not present

## 2017-11-17 ENCOUNTER — Ambulatory Visit
Admission: RE | Admit: 2017-11-17 | Discharge: 2017-11-17 | Disposition: A | Discharge status: Home / Self Care | Payer: Medicare Other | Source: Ambulatory Visit | Attending: Radiation Oncology | Admitting: Radiation Oncology

## 2017-11-17 DIAGNOSIS — Z510 Encounter for antineoplastic radiation therapy: Secondary | ICD-10-CM | POA: Diagnosis not present

## 2017-11-18 ENCOUNTER — Ambulatory Visit
Admission: RE | Admit: 2017-11-18 | Discharge: 2017-11-18 | Disposition: A | Discharge status: Home / Self Care | Payer: Medicare Other | Source: Ambulatory Visit | Attending: Radiation Oncology | Admitting: Radiation Oncology

## 2017-11-18 DIAGNOSIS — Z510 Encounter for antineoplastic radiation therapy: Secondary | ICD-10-CM | POA: Diagnosis not present

## 2017-11-21 ENCOUNTER — Ambulatory Visit
Admission: RE | Admit: 2017-11-21 | Discharge: 2017-11-21 | Disposition: A | Discharge status: Home / Self Care | Payer: Medicare Other | Source: Ambulatory Visit | Attending: Radiation Oncology | Admitting: Radiation Oncology

## 2017-11-21 DIAGNOSIS — Z510 Encounter for antineoplastic radiation therapy: Secondary | ICD-10-CM | POA: Diagnosis not present

## 2017-11-22 ENCOUNTER — Encounter: Payer: Self-pay | Admitting: Internal Medicine

## 2017-11-22 ENCOUNTER — Ambulatory Visit (INDEPENDENT_AMBULATORY_CARE_PROVIDER_SITE_OTHER): Payer: Medicare Other | Admitting: Internal Medicine

## 2017-11-22 ENCOUNTER — Ambulatory Visit
Admission: RE | Admit: 2017-11-22 | Discharge: 2017-11-22 | Disposition: A | Discharge status: Home / Self Care | Payer: Medicare Other | Source: Ambulatory Visit | Attending: Radiation Oncology | Admitting: Radiation Oncology

## 2017-11-22 VITALS — BP 122/70 | HR 67 | Temp 97.7°F | Resp 16 | Ht 70.0 in | Wt 189.1 lb

## 2017-11-22 DIAGNOSIS — Z23 Encounter for immunization: Secondary | ICD-10-CM

## 2017-11-22 DIAGNOSIS — Z0000 Encounter for general adult medical examination without abnormal findings: Secondary | ICD-10-CM | POA: Diagnosis not present

## 2017-11-22 DIAGNOSIS — Z510 Encounter for antineoplastic radiation therapy: Secondary | ICD-10-CM | POA: Diagnosis not present

## 2017-11-22 LAB — CBC WITH DIFFERENTIAL/PLATELET
Basophils Absolute: 0 10*3/uL (ref 0.0–0.1)
Basophils Relative: 0.3 % (ref 0.0–3.0)
EOS ABS: 0.2 10*3/uL (ref 0.0–0.7)
Eosinophils Relative: 6.1 % — ABNORMAL HIGH (ref 0.0–5.0)
HCT: 43.9 % (ref 39.0–52.0)
HEMOGLOBIN: 14.9 g/dL (ref 13.0–17.0)
Lymphocytes Relative: 19.6 % (ref 12.0–46.0)
Lymphs Abs: 0.6 10*3/uL — ABNORMAL LOW (ref 0.7–4.0)
MCHC: 34 g/dL (ref 30.0–36.0)
MCV: 89.4 fl (ref 78.0–100.0)
MONO ABS: 0.3 10*3/uL (ref 0.1–1.0)
Monocytes Relative: 9.6 % (ref 3.0–12.0)
NEUTROS PCT: 64.4 % (ref 43.0–77.0)
Neutro Abs: 2.1 10*3/uL (ref 1.4–7.7)
Platelets: 173 10*3/uL (ref 150.0–400.0)
RBC: 4.91 Mil/uL (ref 4.22–5.81)
RDW: 14.2 % (ref 11.5–15.5)
WBC: 3.2 10*3/uL — AB (ref 4.0–10.5)

## 2017-11-22 LAB — LIPID PANEL
Cholesterol: 218 mg/dL — ABNORMAL HIGH (ref 0–200)
HDL: 71.8 mg/dL (ref >39.00)
LDL Cholesterol: 135 mg/dL — ABNORMAL HIGH (ref 0–99)
NonHDL: 146.05
TRIGLYCERIDES: 53 mg/dL (ref 0.0–149.0)
Total CHOL/HDL Ratio: 3
VLDL: 10.6 mg/dL (ref 0.0–40.0)

## 2017-11-22 LAB — COMPREHENSIVE METABOLIC PANEL
ALK PHOS: 57 U/L (ref 39–117)
ALT: 14 U/L (ref 0–53)
AST: 16 U/L (ref 0–37)
Albumin: 4.5 g/dL (ref 3.5–5.2)
BUN: 18 mg/dL (ref 6–23)
CALCIUM: 9.9 mg/dL (ref 8.4–10.5)
CHLORIDE: 104 meq/L (ref 96–112)
CO2: 30 meq/L (ref 19–32)
Creatinine, Ser: 0.9 mg/dL (ref 0.40–1.50)
GFR: 107.18 mL/min (ref >60.00)
GLUCOSE: 101 mg/dL — AB (ref 70–99)
POTASSIUM: 4.6 meq/L (ref 3.5–5.1)
Sodium: 140 mEq/L (ref 135–145)
Total Bilirubin: 0.5 mg/dL (ref 0.2–1.2)
Total Protein: 7.3 g/dL (ref 6.0–8.3)

## 2017-11-22 LAB — HEMOGLOBIN A1C: Hgb A1c MFr Bld: 6.1 % (ref 4.6–6.5)

## 2017-11-22 MED ORDER — ZOSTER VAC RECOMB ADJUVANTED 50 MCG/0.5ML IM SUSR: 0.5000 mL | Freq: Once | INTRAMUSCULAR | 1 refills | Status: AC

## 2017-11-22 NOTE — Progress Notes (Signed)
Subjective:    Patient ID: Joseph Golden, male    DOB: 05/21/1945, 72 y.o.   MRN: [id number]  DOS:  11/22/2017 Type of visit - description : cpx Interval history: Since the last office visit, was diagnosed with prostate cancer, had surgery, and started XRT 2 weeks ago. Fortunately, he is feeling well emotionally.  So far has not had any side effects from radiation therapy.  Review of Systems  Other than above, a 14 point review of systems is negative      Past Medical History:  Diagnosis Date  . Allergy    SEASONAL  . Diverticulosis 11/16/2012  . Helicobacter pylori gastritis 04/23/2013  . History of hiatal hernia   . HTN (hypertension)   . 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 Other and unspecified hyperlipidemia 10/31/2012  . Personal history of colonic adenoma 12/30/2011   12/2011 - 12 mm adenoma with high-grade dysplasia  . Prostate cancer ([place]) 02/2017    Past Surgical History:  Procedure Laterality Date  . COLONOSCOPY  2013  . LYMPHADENECTOMY Bilateral 06/08/2017   Procedure: LYMPHADENECTOMY;  Surgeon: Lucas Mallow, MD;  Location: [facility];  Service: Urology;  Laterality: Bilateral;  . NO PAST SURGERIES    . PROSTATE BIOPSY    . ROBOT ASSISTED LAPAROSCOPIC RADICAL PROSTATECTOMY N/A 06/08/2017   Procedure: XI ROBOTIC ASSISTED LAPAROSCOPIC RADICAL PROSTATECTOMY;  Surgeon: Lucas Mallow, MD;  Location: [facility];  Service: Urology;  Laterality: N/A;    Social History   Socioeconomic History  . Marital status: Married    Spouse name: Not on file  . Number of children: 5  . Years of education: Not on file  . Highest education level: Not on file  Occupational History  . Occupation: retired   Scientific laboratory technician  . Financial resource strain: Not on file  . Food insecurity:    Worry: Not on file    Inability: Not on file  . Transportation needs:    Medical: Not on file    Non-medical: Not on  file  Tobacco Use  . Smoking status: Former Smoker    Types: Pipe    Last attempt to quit: 01/12/2003    Years since quitting: 14.8  . Smokeless tobacco: Never Used  . Tobacco comment: quit ~ 2005, smoked pipe   Substance and Sexual Activity  . Alcohol use: Yes    Alcohol/week: 2.0 standard drinks    Types: 2 Cans of beer per week    Comment: social  . Drug use: No  . Sexual activity: Not Currently  Lifestyle  . Physical activity:    Days per week: Not on file    Minutes per session: Not on file  . Stress: Not on file  Relationships  . Social connections:    Talks on phone: Not on file    Gets together: Not on file    Attends religious service: Not on file    Active member of club or organization: Not on file    Attends meetings of clubs or organizations: Not on file    Relationship status: Not on file  . Intimate partner violence:    Fear of current or ex partner: Not on file    Emotionally abused: Not on file    Physically abused: Not on file    Forced sexual activity: Not on file  Other Topics Concern  . Not on file  Social History  Narrative   Married for 47 years. Lives in [place]. One son and four daughters.     Family History  Problem Relation Age of Onset  . Hypertension Mother   . Breast cancer Mother   . Brain cancer Father 58  . Stomach cancer Other   . Hypertension Sister   . Hypertension Brother   . CVA Maternal Uncle   . Colon cancer Neg Hx   . Esophageal cancer Neg Hx   . Rectal cancer Neg Hx   . Prostate cancer Neg Hx   . Diabetes Neg Hx   . CAD Neg Hx      Allergies as of 11/22/2017   No Known Allergies     Medication List        Accurate as of 11/22/17 11:59 PM. Always use your most recent med list.          amLODipine 10 MG tablet Commonly known as:  NORVASC Take 1 tablet (10 mg total) by mouth daily.   carvedilol 6.25 MG tablet Commonly known as:  COREG Take 1 tablet (6.25 mg total) 2 (two) times daily with a meal by  mouth.   losartan-hydrochlorothiazide 50-12.5 MG tablet Commonly known as:  HYZAAR Take 1 tablet by mouth daily.   Zoster Vaccine Adjuvanted injection Commonly known as:  SHINGRIX Inject 0.5 mLs into the muscle once for 1 dose.          Objective:   Physical Exam BP 122/70 (BP Location: Left Arm, Patient Position: Sitting, Cuff Size: Small)   Pulse 67   Temp 97.7 F (36.5 C) (Oral)   Resp 16   Ht 5\' 10"  (1.778 m)   Wt 189 lb 2 oz (85.8 kg)   SpO2 98%   BMI 27.14 kg/m  General: Well developed, NAD, BMI noted Neck: No  thyromegaly  HEENT:  Normocephalic . Face symmetric, atraumatic Lungs:  CTA B Normal respiratory effort, no intercostal retractions, no accessory muscle use. Heart: RRR,  no murmur.  No pretibial edema bilaterally  Abdomen:  Not distended, soft, non-tender. No rebound or rigidity.   Skin: Exposed areas without rash. Not pale. Not jaundice Neurologic:  alert & oriented X3.  Speech normal, gait appropriate for age and unassisted Strength symmetric and appropriate for age.  Psych: Cognition and judgment appear intact.  Cooperative with normal attention span and concentration.  Behavior appropriate. No anxious or depressed appearing.     Assessment & Plan:   Assessment  HTN Hyperlipidemia Prostate ca dx 2019; s/p radical prostatectomy 05/2017, rx XRT GI: --Colon polyps --Iron deficient anemia, --EGD 4-15 : Gastritis, H. pylori positive: Status post treatment. HH likely causing  Cameron erosions ---> a  cause for chronic blood loss. Surgery to correct HH? + H. pylori gastritis 5 /2015,(-) breath test 11-2014   PLAN HTN: Continue with Amlodipine, carvedilol and hyzaar. Hyperlipidemia: Checking labs.  Diet controlled RTC 1 year

## 2017-11-22 NOTE — Progress Notes (Signed)
Pre visit review using our clinic review tool, if applicable. No additional management support is needed unless otherwise documented below in the visit note. 

## 2017-11-22 NOTE — Assessment & Plan Note (Signed)
-  Tdap 2013; PNM shot 2015;  prevnar 11-12-14 ; flu shot today ; shingrex rx printed to use at his convenience  -CCS: 12-2011 colonoscopy, had  a large polyp, f/u by GI Flex sigmoidoscopy 11 -14  no residual polyp cscope : 05/2017, no polyps, 5 years per report -Prostate cancer dx 02/2017   -Diet and exercise discussed.  - former pipe smoker, sees a Pharmacist, community regularly  -Labs: CMP, FLP, CBC, A1c

## 2017-11-22 NOTE — Patient Instructions (Addendum)
GO TO THE LAB : Get the blood work     GO TO THE FRONT DESK Schedule your next appointment for a physical exam in 1 year    Check the  blood pressure 2 or 3 times a month  Be sure your blood pressure is between 110/65 and  135/85. If it is consistently higher or lower, let me know  Consider a shingles vaccination called Shingrix

## 2017-11-23 ENCOUNTER — Ambulatory Visit
Admission: RE | Admit: 2017-11-23 | Discharge: 2017-11-23 | Disposition: A | Discharge status: Home / Self Care | Payer: Medicare Other | Source: Ambulatory Visit | Attending: Radiation Oncology | Admitting: Radiation Oncology

## 2017-11-23 DIAGNOSIS — Z510 Encounter for antineoplastic radiation therapy: Secondary | ICD-10-CM | POA: Diagnosis not present

## 2017-11-23 NOTE — Assessment & Plan Note (Signed)
HTN: Continue with Amlodipine, carvedilol and hyzaar. Hyperlipidemia: Checking labs.  Diet controlled RTC 1 year

## 2017-11-24 ENCOUNTER — Ambulatory Visit: Admission: RE | Admit: 2017-11-24 | Payer: Medicare Other | Source: Ambulatory Visit

## 2017-11-24 DIAGNOSIS — Z510 Encounter for antineoplastic radiation therapy: Secondary | ICD-10-CM | POA: Diagnosis not present

## 2017-11-25 ENCOUNTER — Ambulatory Visit
Admission: RE | Admit: 2017-11-25 | Discharge: 2017-11-25 | Disposition: A | Discharge status: Home / Self Care | Payer: Medicare Other | Source: Ambulatory Visit | Attending: Radiation Oncology | Admitting: Radiation Oncology

## 2017-11-25 DIAGNOSIS — Z510 Encounter for antineoplastic radiation therapy: Secondary | ICD-10-CM | POA: Diagnosis not present

## 2017-11-28 ENCOUNTER — Ambulatory Visit
Admission: RE | Admit: 2017-11-28 | Discharge: 2017-11-28 | Disposition: A | Discharge status: Home / Self Care | Payer: Medicare Other | Source: Ambulatory Visit | Attending: Radiation Oncology | Admitting: Radiation Oncology

## 2017-11-28 DIAGNOSIS — Z510 Encounter for antineoplastic radiation therapy: Secondary | ICD-10-CM | POA: Diagnosis not present

## 2017-11-29 ENCOUNTER — Ambulatory Visit
Admission: RE | Admit: 2017-11-29 | Discharge: 2017-11-29 | Disposition: A | Discharge status: Home / Self Care | Payer: Medicare Other | Source: Ambulatory Visit | Attending: Radiation Oncology | Admitting: Radiation Oncology

## 2017-11-29 DIAGNOSIS — Z510 Encounter for antineoplastic radiation therapy: Secondary | ICD-10-CM | POA: Diagnosis not present

## 2017-11-30 ENCOUNTER — Ambulatory Visit
Admission: RE | Admit: 2017-11-30 | Discharge: 2017-11-30 | Disposition: A | Discharge status: Home / Self Care | Payer: Medicare Other | Source: Ambulatory Visit | Attending: Radiation Oncology | Admitting: Radiation Oncology

## 2017-11-30 DIAGNOSIS — Z510 Encounter for antineoplastic radiation therapy: Secondary | ICD-10-CM | POA: Diagnosis not present

## 2017-12-01 ENCOUNTER — Ambulatory Visit
Admission: RE | Admit: 2017-12-01 | Discharge: 2017-12-01 | Disposition: A | Discharge status: Home / Self Care | Payer: Medicare Other | Source: Ambulatory Visit | Attending: Radiation Oncology | Admitting: Radiation Oncology

## 2017-12-01 DIAGNOSIS — Z510 Encounter for antineoplastic radiation therapy: Secondary | ICD-10-CM | POA: Diagnosis not present

## 2017-12-02 ENCOUNTER — Ambulatory Visit
Admission: RE | Admit: 2017-12-02 | Discharge: 2017-12-02 | Disposition: A | Discharge status: Home / Self Care | Payer: Medicare Other | Source: Ambulatory Visit | Attending: Radiation Oncology | Admitting: Radiation Oncology

## 2017-12-02 DIAGNOSIS — Z510 Encounter for antineoplastic radiation therapy: Secondary | ICD-10-CM | POA: Diagnosis not present

## 2017-12-04 ENCOUNTER — Ambulatory Visit
Admission: RE | Admit: 2017-12-04 | Discharge: 2017-12-04 | Disposition: A | Discharge status: Home / Self Care | Payer: Medicare Other | Source: Ambulatory Visit | Attending: Radiation Oncology | Admitting: Radiation Oncology

## 2017-12-04 DIAGNOSIS — Z510 Encounter for antineoplastic radiation therapy: Secondary | ICD-10-CM | POA: Diagnosis not present

## 2017-12-05 ENCOUNTER — Ambulatory Visit
Admission: RE | Admit: 2017-12-05 | Discharge: 2017-12-05 | Disposition: A | Discharge status: Home / Self Care | Payer: Medicare Other | Source: Ambulatory Visit | Attending: Radiation Oncology | Admitting: Radiation Oncology

## 2017-12-05 DIAGNOSIS — Z510 Encounter for antineoplastic radiation therapy: Secondary | ICD-10-CM | POA: Diagnosis not present

## 2017-12-06 ENCOUNTER — Ambulatory Visit
Admission: RE | Admit: 2017-12-06 | Discharge: 2017-12-06 | Disposition: A | Discharge status: Home / Self Care | Payer: Medicare Other | Source: Ambulatory Visit | Attending: Radiation Oncology | Admitting: Radiation Oncology

## 2017-12-06 DIAGNOSIS — Z510 Encounter for antineoplastic radiation therapy: Secondary | ICD-10-CM | POA: Diagnosis not present

## 2017-12-07 ENCOUNTER — Ambulatory Visit
Admission: RE | Admit: 2017-12-07 | Discharge: 2017-12-07 | Disposition: A | Discharge status: Home / Self Care | Payer: Medicare Other | Source: Ambulatory Visit | Attending: Radiation Oncology | Admitting: Radiation Oncology

## 2017-12-07 DIAGNOSIS — Z510 Encounter for antineoplastic radiation therapy: Secondary | ICD-10-CM | POA: Diagnosis not present

## 2017-12-12 ENCOUNTER — Ambulatory Visit
Admission: RE | Admit: 2017-12-12 | Discharge: 2017-12-12 | Disposition: A | Discharge status: Home / Self Care | Payer: Medicare Other | Source: Ambulatory Visit | Attending: Radiation Oncology | Admitting: Radiation Oncology

## 2017-12-12 DIAGNOSIS — C61 Malignant neoplasm of prostate: Secondary | ICD-10-CM | POA: Insufficient documentation

## 2017-12-12 DIAGNOSIS — Z510 Encounter for antineoplastic radiation therapy: Secondary | ICD-10-CM | POA: Diagnosis present

## 2017-12-13 ENCOUNTER — Ambulatory Visit
Admission: RE | Admit: 2017-12-13 | Discharge: 2017-12-13 | Disposition: A | Discharge status: Home / Self Care | Payer: Medicare Other | Source: Ambulatory Visit | Attending: Radiation Oncology | Admitting: Radiation Oncology

## 2017-12-13 DIAGNOSIS — Z510 Encounter for antineoplastic radiation therapy: Secondary | ICD-10-CM | POA: Diagnosis not present

## 2017-12-14 ENCOUNTER — Ambulatory Visit
Admission: RE | Admit: 2017-12-14 | Discharge: 2017-12-14 | Disposition: A | Discharge status: Home / Self Care | Payer: Medicare Other | Source: Ambulatory Visit | Attending: Radiation Oncology | Admitting: Radiation Oncology

## 2017-12-14 DIAGNOSIS — Z510 Encounter for antineoplastic radiation therapy: Secondary | ICD-10-CM | POA: Diagnosis not present

## 2017-12-15 ENCOUNTER — Ambulatory Visit
Admission: RE | Admit: 2017-12-15 | Discharge: 2017-12-15 | Disposition: A | Discharge status: Home / Self Care | Payer: Medicare Other | Source: Ambulatory Visit | Attending: Radiation Oncology | Admitting: Radiation Oncology

## 2017-12-15 ENCOUNTER — Ambulatory Visit: Payer: Medicare Other

## 2017-12-15 DIAGNOSIS — Z510 Encounter for antineoplastic radiation therapy: Secondary | ICD-10-CM | POA: Diagnosis not present

## 2017-12-16 ENCOUNTER — Ambulatory Visit: Payer: Medicare Other

## 2017-12-16 ENCOUNTER — Ambulatory Visit
Admission: RE | Admit: 2017-12-16 | Discharge: 2017-12-16 | Disposition: A | Discharge status: Home / Self Care | Payer: Medicare Other | Source: Ambulatory Visit | Attending: Radiation Oncology | Admitting: Radiation Oncology

## 2017-12-16 DIAGNOSIS — Z510 Encounter for antineoplastic radiation therapy: Secondary | ICD-10-CM | POA: Diagnosis not present

## 2017-12-17 ENCOUNTER — Ambulatory Visit: Payer: Medicare Other

## 2017-12-19 ENCOUNTER — Ambulatory Visit
Admission: RE | Admit: 2017-12-19 | Discharge: 2017-12-19 | Disposition: A | Discharge status: Home / Self Care | Payer: Medicare Other | Source: Ambulatory Visit | Attending: Radiation Oncology | Admitting: Radiation Oncology

## 2017-12-19 DIAGNOSIS — Z510 Encounter for antineoplastic radiation therapy: Secondary | ICD-10-CM | POA: Diagnosis not present

## 2017-12-20 ENCOUNTER — Ambulatory Visit
Admission: RE | Admit: 2017-12-20 | Discharge: 2017-12-20 | Disposition: A | Discharge status: Home / Self Care | Payer: Medicare Other | Source: Ambulatory Visit | Attending: Radiation Oncology | Admitting: Radiation Oncology

## 2017-12-20 DIAGNOSIS — Z510 Encounter for antineoplastic radiation therapy: Secondary | ICD-10-CM | POA: Diagnosis not present

## 2017-12-21 ENCOUNTER — Ambulatory Visit
Admission: RE | Admit: 2017-12-21 | Discharge: 2017-12-21 | Disposition: A | Discharge status: Home / Self Care | Payer: Medicare Other | Source: Ambulatory Visit | Attending: Radiation Oncology | Admitting: Radiation Oncology

## 2017-12-21 ENCOUNTER — Other Ambulatory Visit: Payer: Self-pay | Admitting: Internal Medicine

## 2017-12-21 DIAGNOSIS — Z510 Encounter for antineoplastic radiation therapy: Secondary | ICD-10-CM | POA: Diagnosis not present

## 2017-12-22 ENCOUNTER — Ambulatory Visit
Admission: RE | Admit: 2017-12-22 | Discharge: 2017-12-22 | Disposition: A | Discharge status: Home / Self Care | Payer: Medicare Other | Source: Ambulatory Visit | Attending: Radiation Oncology | Admitting: Radiation Oncology

## 2017-12-22 DIAGNOSIS — Z510 Encounter for antineoplastic radiation therapy: Secondary | ICD-10-CM | POA: Diagnosis not present

## 2017-12-23 ENCOUNTER — Ambulatory Visit
Admission: RE | Admit: 2017-12-23 | Discharge: 2017-12-23 | Disposition: A | Discharge status: Home / Self Care | Payer: Medicare Other | Source: Ambulatory Visit | Attending: Radiation Oncology | Admitting: Radiation Oncology

## 2017-12-23 DIAGNOSIS — Z510 Encounter for antineoplastic radiation therapy: Secondary | ICD-10-CM | POA: Diagnosis not present

## 2017-12-26 ENCOUNTER — Ambulatory Visit
Admission: RE | Admit: 2017-12-26 | Discharge: 2017-12-26 | Disposition: A | Discharge status: Home / Self Care | Payer: Medicare Other | Source: Ambulatory Visit | Attending: Radiation Oncology | Admitting: Radiation Oncology

## 2017-12-26 DIAGNOSIS — Z510 Encounter for antineoplastic radiation therapy: Secondary | ICD-10-CM | POA: Diagnosis not present

## 2017-12-27 ENCOUNTER — Ambulatory Visit
Admission: RE | Admit: 2017-12-27 | Discharge: 2017-12-27 | Disposition: A | Discharge status: Home / Self Care | Payer: Medicare Other | Source: Ambulatory Visit | Attending: Radiation Oncology | Admitting: Radiation Oncology

## 2017-12-27 DIAGNOSIS — Z510 Encounter for antineoplastic radiation therapy: Secondary | ICD-10-CM | POA: Diagnosis not present

## 2017-12-28 ENCOUNTER — Ambulatory Visit
Admission: RE | Admit: 2017-12-28 | Discharge: 2017-12-28 | Disposition: A | Discharge status: Home / Self Care | Payer: Medicare Other | Source: Ambulatory Visit | Attending: Radiation Oncology | Admitting: Radiation Oncology

## 2017-12-28 DIAGNOSIS — Z510 Encounter for antineoplastic radiation therapy: Secondary | ICD-10-CM | POA: Diagnosis not present

## 2017-12-29 ENCOUNTER — Ambulatory Visit
Admission: RE | Admit: 2017-12-29 | Discharge: 2017-12-29 | Disposition: A | Discharge status: Home / Self Care | Payer: Medicare Other | Source: Ambulatory Visit | Attending: Radiation Oncology | Admitting: Radiation Oncology

## 2017-12-29 DIAGNOSIS — Z510 Encounter for antineoplastic radiation therapy: Secondary | ICD-10-CM | POA: Diagnosis not present

## 2017-12-30 ENCOUNTER — Ambulatory Visit
Admission: RE | Admit: 2017-12-30 | Discharge: 2017-12-30 | Disposition: A | Discharge status: Home / Self Care | Payer: Medicare Other | Source: Ambulatory Visit | Attending: Radiation Oncology | Admitting: Radiation Oncology

## 2017-12-30 DIAGNOSIS — Z510 Encounter for antineoplastic radiation therapy: Secondary | ICD-10-CM | POA: Diagnosis not present

## 2018-01-02 ENCOUNTER — Encounter: Payer: Self-pay | Admitting: Medical Oncology

## 2018-01-02 ENCOUNTER — Ambulatory Visit
Admission: RE | Admit: 2018-01-02 | Discharge: 2018-01-02 | Disposition: A | Discharge status: Home / Self Care | Payer: Medicare Other | Source: Ambulatory Visit | Attending: Radiation Oncology | Admitting: Radiation Oncology

## 2018-01-02 ENCOUNTER — Encounter: Payer: Self-pay | Admitting: Radiation Oncology

## 2018-01-02 ENCOUNTER — Ambulatory Visit: Payer: Medicare Other

## 2018-01-02 DIAGNOSIS — Z510 Encounter for antineoplastic radiation therapy: Secondary | ICD-10-CM | POA: Diagnosis not present

## 2018-01-03 ENCOUNTER — Ambulatory Visit: Payer: Medicare Other

## 2018-01-05 NOTE — Progress Notes (Signed)
Joseph Golden completed radiation treatments today. He will follow up with [name] 02/02/18. Pt will call with questions or concerns.

## 2018-01-05 NOTE — Progress Notes (Signed)
I met Mr. Kenley when he consulted 03/07/17 with Dr. Tammi Klippel. He chose prostatectomy. His surgical margins were positive and his first PSA in July was 0.11. He has chosen to move forward with salvage radiation. He did receive his ADT 08/23/17.

## 2018-01-08 NOTE — Progress Notes (Signed)
  Radiation Oncology         [phone] ________________________________  Name: Joseph Golden MRN: [id number]  Date: 01/02/2018  DOB: 03-23-1945  End of Treatment Note  Diagnosis:   72 y.o. gentleman with detectable PSA of 0.11 s/p recent  RALP for treatment of pT3b,N0Mx, Gleason 4+5 adenocarcinoma of the prostate with negative surgical margins but evidence of extraprostatic extension bilaterally at the base, seminal vesicle invasion and lymphovascular invasion     Indication for treatment:  Curative, Definitive Radiotherapy       Radiation treatment dates:   10/30-12/23/19  Site/dose:  1. The prostate, seminal vesicles, and pelvic lymph nodes were initially treated to 45 Gy in 25 fractions of 1.8 Gy  2. The prostate only was boosted to 75 Gy with 15 additional fractions of 2.0 Gy   Beams/energy:  1. The prostate, seminal vesicles, and pelvic lymph nodes were initially treated using helical intensity modulated radiotherapy delivering 6 megavolt photons. Image guidance was performed with megavoltage CT studies prior to each fraction. He was immobilized with a body fix lower extremity mold.  2. the prostate only was boosted using helical intensity modulated radiotherapy delivering 6 megavolt photons. Image guidance was performed with megavoltage CT studies prior to each fraction. He was immobilized with a body fix lower extremity mold.  Narrative: The patient tolerated radiation treatment relatively well.   The patient experienced some minor urinary irritation and modest fatigue.  Plan: The patient has completed radiation treatment. He will return to radiation oncology clinic for routine followup in one month. I advised him to call or return sooner if he has any questions or concerns related to his recovery or treatment. ________________________________  Sheral Apley. Tammi Klippel, M.D.

## 2018-02-01 LAB — PSA: PSA: <0.015

## 2018-02-02 ENCOUNTER — Other Ambulatory Visit: Payer: Self-pay

## 2018-02-02 ENCOUNTER — Ambulatory Visit
Admission: RE | Admit: 2018-02-02 | Discharge: 2018-02-02 | Disposition: A | Discharge status: Home / Self Care | Payer: Medicare Other | Source: Ambulatory Visit | Attending: Radiation Oncology | Admitting: Radiation Oncology

## 2018-02-02 ENCOUNTER — Encounter: Payer: Self-pay | Admitting: Urology

## 2018-02-02 VITALS — BP 141/83 | HR 61 | Temp 97.5°F | Resp 18 | Ht 70.5 in | Wt 194.1 lb

## 2018-02-02 DIAGNOSIS — Z510 Encounter for antineoplastic radiation therapy: Secondary | ICD-10-CM | POA: Diagnosis not present

## 2018-02-02 DIAGNOSIS — C61 Malignant neoplasm of prostate: Secondary | ICD-10-CM | POA: Diagnosis not present

## 2018-02-02 NOTE — Progress Notes (Signed)
Radiation Oncology         [phone] ________________________________  Name: Joseph Golden MRN: [id number]  Date: 02/02/2018  DOB: Nov 20, 1945  Post Treatment Note  CC: Joseph Branch, MD  Joseph Mallow, MD  Diagnosis:   73 y.o. gentleman withdetectable PSA of 0.11 s/p recent RALP for treatment of pT3b,N0Mx, Gleason 4+5 adenocarcinoma of the prostate with negative surgical margins but evidence of extraprostatic extension bilaterally at the base,seminal vesicle invasion and lymphovascular invasion.  Interval Since Last Radiation:  4 weeks  10/30-12/23/19: 1. The prostate, seminal vesicles, and pelvic lymph nodes were initially treated to 45 Gy in 25 fractions of 1.8 Gy  2. The prostate only was boosted to 75 Gy with 15 additional fractions of 2.0 Gy  Narrative:  The patient returns today for routine follow-up.  He tolerated radiation treatment relatively well with only minor urinary irritation and modest fatigue.  He was maintained on ADT (Started 08/23/17) throughout his course of radiation.  Unfortunately, his PSA was further elevated to 0.36 on 10/20/17, despite starting ADT.                        On review of systems, the patient states that he is doing very well overall.  He has noted significant improvement in his LUTS over the past week and a half.  His current IPSS score is 6 indicating mild symptoms only.  He specifically denies dysuria, gross hematuria, excessive daytime frequency, urgency, straining to void, incomplete emptying or incontinence.  He has mild residual frequency and nocturia x2 per night.  He reports a healthy appetite and is maintaining his weight.  He denies abdominal pain, nausea, vomiting, diarrhea or constipation.  He has continued to tolerate the ADT very well with no significant impact on his energy level.  He did have some fairly intense hot flashes in the month of December but these have since eased off and are no longer bothersome.  Overall, he is quite  pleased with his progress to date.  ALLERGIES:  has No Known Allergies.  Meds: Current Outpatient Medications  Medication Sig Dispense Refill  . amLODipine (NORVASC) 10 MG tablet Take 1 tablet (10 mg total) by mouth daily. 90 tablet 3  . carvedilol (COREG) 6.25 MG tablet Take 1 tablet (6.25 mg total) 2 (two) times daily with a meal by mouth. 180 tablet 3  . losartan-hydrochlorothiazide (HYZAAR) 50-12.5 MG tablet Take 1 tablet by mouth daily. 90 tablet 3   No current facility-administered medications for this encounter.     Physical Findings:  height is 5' 10.5" (1.791 m) and weight is 194 lb 2 oz (88.1 kg). His oral temperature is 97.5 F (36.4 C) (abnormal). His blood pressure is 141/83 (abnormal) and his pulse is 61. His respiration is 18 and oxygen saturation is 100%.  Pain Assessment Pain Score: 0-No pain/10 In general this is a well appearing African-American male in no acute distress.  He's alert and oriented x4 and appropriate throughout the examination. Cardiopulmonary assessment is negative for acute distress and he exhibits normal effort.   Lab Findings: Lab Results  Component Value Date   WBC 3.2 (L) 11/22/2017   HGB 14.9 11/22/2017   HCT 43.9 11/22/2017   MCV 89.4 11/22/2017   PLT 173.0 11/22/2017     Radiographic Findings: No results found.  Impression/Plan: 1. 73 y.o. gentleman withdetectable PSA of 0.11 s/p recent RALP for treatment of pT3b,N0Mx, Gleason 4+5 adenocarcinoma of the  prostate with negative surgical margins but evidence of extraprostatic extension bilaterally at the base,seminal vesicle invasion and lymphovascular invasion.   He will continue to follow up with urology for ongoing PSA determinations and has an appointment scheduled with Dr. Gloriann Loan on 02/09/2018.  He continues to tolerate ADT very well and will discuss the anticipated/recommended duration of this treatment with Dr. Gloriann Loan at his upcoming follow-up appointment.  He understands what to expect  with regards to PSA monitoring going forward. I will look forward to following his response to treatment via correspondence with urology, and would be happy to continue to participate in his care if clinically indicated. I talked to the patient about what to expect in the future, including his risk for erectile dysfunction and rectal bleeding. I encouraged him to call or return to the office if he has any questions regarding his previous radiation or possible radiation side effects. He was comfortable with this plan and will follow up as needed.    Joseph Johns, PA-C

## 2018-02-09 ENCOUNTER — Other Ambulatory Visit: Payer: Self-pay | Admitting: Internal Medicine

## 2018-03-02 ENCOUNTER — Encounter: Payer: Self-pay | Admitting: Internal Medicine

## 2018-05-05 LAB — PSA: PSA: <0.015

## 2018-05-22 ENCOUNTER — Encounter: Payer: Self-pay | Admitting: Internal Medicine

## 2018-07-03 ENCOUNTER — Other Ambulatory Visit: Payer: Self-pay | Admitting: Internal Medicine

## 2018-08-14 LAB — PSA: PSA: <0.015

## 2018-08-24 ENCOUNTER — Encounter: Payer: Self-pay | Admitting: Internal Medicine

## 2018-11-14 LAB — PSA: PSA: <0.015

## 2018-11-17 ENCOUNTER — Other Ambulatory Visit: Payer: Self-pay | Admitting: Internal Medicine

## 2018-11-27 ENCOUNTER — Other Ambulatory Visit: Payer: Self-pay

## 2018-11-27 ENCOUNTER — Encounter: Payer: Self-pay | Admitting: Internal Medicine

## 2018-11-28 ENCOUNTER — Ambulatory Visit (INDEPENDENT_AMBULATORY_CARE_PROVIDER_SITE_OTHER): Payer: Medicare Other | Admitting: Internal Medicine

## 2018-11-28 ENCOUNTER — Other Ambulatory Visit: Payer: Self-pay

## 2018-11-28 ENCOUNTER — Encounter: Payer: Self-pay | Admitting: Internal Medicine

## 2018-11-28 VITALS — BP 139/80 | HR 65 | Temp 95.8°F | Resp 18 | Ht 70.0 in | Wt 199.1 lb

## 2018-11-28 DIAGNOSIS — Z23 Encounter for immunization: Secondary | ICD-10-CM

## 2018-11-28 DIAGNOSIS — R739 Hyperglycemia, unspecified: Secondary | ICD-10-CM

## 2018-11-28 DIAGNOSIS — E785 Hyperlipidemia, unspecified: Secondary | ICD-10-CM | POA: Diagnosis not present

## 2018-11-28 DIAGNOSIS — Z0000 Encounter for general adult medical examination without abnormal findings: Secondary | ICD-10-CM | POA: Diagnosis not present

## 2018-11-28 LAB — LIPID PANEL
Cholesterol: 231 mg/dL — ABNORMAL HIGH (ref 0–200)
HDL: 65.6 mg/dL (ref >39.00)
LDL Cholesterol: 153 mg/dL — ABNORMAL HIGH (ref 0–99)
NonHDL: 165.38
Total CHOL/HDL Ratio: 4
Triglycerides: 60 mg/dL (ref 0.0–149.0)
VLDL: 12 mg/dL (ref 0.0–40.0)

## 2018-11-28 LAB — CBC WITH DIFFERENTIAL/PLATELET
Basophils Absolute: 0 10*3/uL (ref 0.0–0.1)
Basophils Relative: 0.5 % (ref 0.0–3.0)
Eosinophils Absolute: 0.2 10*3/uL (ref 0.0–0.7)
Eosinophils Relative: 5.7 % — ABNORMAL HIGH (ref 0.0–5.0)
HCT: 38.1 % — ABNORMAL LOW (ref 39.0–52.0)
Hemoglobin: 12.8 g/dL — ABNORMAL LOW (ref 13.0–17.0)
Lymphocytes Relative: 15.6 % (ref 12.0–46.0)
Lymphs Abs: 0.7 10*3/uL (ref 0.7–4.0)
MCHC: 33.7 g/dL (ref 30.0–36.0)
MCV: 89.5 fl (ref 78.0–100.0)
Monocytes Absolute: 0.4 10*3/uL (ref 0.1–1.0)
Monocytes Relative: 9.2 % (ref 3.0–12.0)
Neutro Abs: 3 10*3/uL (ref 1.4–7.7)
Neutrophils Relative %: 69 % (ref 43.0–77.0)
Platelets: 251 10*3/uL (ref 150.0–400.0)
RBC: 4.25 Mil/uL (ref 4.22–5.81)
RDW: 14.7 % (ref 11.5–15.5)
WBC: 4.3 10*3/uL (ref 4.0–10.5)

## 2018-11-28 LAB — COMPREHENSIVE METABOLIC PANEL
ALT: 15 U/L (ref 0–53)
AST: 16 U/L (ref 0–37)
Albumin: 4.5 g/dL (ref 3.5–5.2)
Alkaline Phosphatase: 76 U/L (ref 39–117)
BUN: 13 mg/dL (ref 6–23)
CO2: 28 mEq/L (ref 19–32)
Calcium: 9.8 mg/dL (ref 8.4–10.5)
Chloride: 104 mEq/L (ref 96–112)
Creatinine, Ser: 0.79 mg/dL (ref 0.40–1.50)
GFR: 116.88 mL/min (ref >60.00)
Glucose, Bld: 109 mg/dL — ABNORMAL HIGH (ref 70–99)
Potassium: 4.2 mEq/L (ref 3.5–5.1)
Sodium: 140 mEq/L (ref 135–145)
Total Bilirubin: 0.5 mg/dL (ref 0.2–1.2)
Total Protein: 7.2 g/dL (ref 6.0–8.3)

## 2018-11-28 LAB — HEMOGLOBIN A1C: Hgb A1c MFr Bld: 6.1 % (ref 4.6–6.5)

## 2018-11-28 LAB — TSH: TSH: 0.82 u[IU]/mL (ref 0.35–4.50)

## 2018-11-28 NOTE — Progress Notes (Signed)
Pre visit review using our clinic review tool, if applicable. No additional management support is needed unless otherwise documented below in the visit note. 

## 2018-11-28 NOTE — Progress Notes (Signed)
Subjective:    Patient ID: Joseph Golden, male    DOB: Oct 20, 1945, 73 y.o.   MRN: [id number]  DOS:  11/28/2018 Type of visit - description: CPX Since the last office visit he is doing well.  He finished XRT for prostate cancer.    Review of Systems Feels well.  Denies dysuria, gross hematuria, difficulty urinating or bladder incontinence.  Other than above, a 14 point review of systems is negative     Past Medical History:  Diagnosis Date  . Allergy    SEASONAL  . Diverticulosis 11/16/2012  . Helicobacter pylori gastritis 04/23/2013  . History of hiatal hernia   . HTN (hypertension)   . 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 Other and unspecified hyperlipidemia 10/31/2012  . Personal history of colonic adenoma 12/30/2011   12/2011 - 12 mm adenoma with high-grade dysplasia  . Prostate cancer ([place]) 02/2017    Past Surgical History:  Procedure Laterality Date  . COLONOSCOPY  2013  . LYMPHADENECTOMY Bilateral 06/08/2017   Procedure: LYMPHADENECTOMY;  Surgeon: Lucas Mallow, MD;  Location: [facility];  Service: Urology;  Laterality: Bilateral;  . NO PAST SURGERIES    . PROSTATE BIOPSY    . ROBOT ASSISTED LAPAROSCOPIC RADICAL PROSTATECTOMY N/A 06/08/2017   Procedure: XI ROBOTIC ASSISTED LAPAROSCOPIC RADICAL PROSTATECTOMY;  Surgeon: Lucas Mallow, MD;  Location: [facility];  Service: Urology;  Laterality: N/A;    Social History   Socioeconomic History  . Marital status: Married    Spouse name: Not on file  . Number of children: 5  . Years of education: Not on file  . Highest education level: Not on file  Occupational History  . Occupation: retired   Scientific laboratory technician  . Financial resource strain: Not on file  . Food insecurity    Worry: Not on file    Inability: Not on file  . Transportation needs    Medical: Not on file    Non-medical: Not on file  Tobacco Use  . Smoking status: Former Smoker   Types: Pipe    Quit date: 01/12/2003    Years since quitting: 15.8  . Smokeless tobacco: Never Used  . Tobacco comment: quit ~ 2005, smoked pipe   Substance and Sexual Activity  . Alcohol use: Yes    Alcohol/week: 2.0 standard drinks    Types: 2 Cans of beer per week    Comment: social  . Drug use: No  . Sexual activity: Not Currently  Lifestyle  . Physical activity    Days per week: Not on file    Minutes per session: Not on file  . Stress: Not on file  Relationships  . Social Herbalist on phone: Not on file    Gets together: Not on file    Attends religious service: Not on file    Active member of club or organization: Not on file    Attends meetings of clubs or organizations: Not on file    Relationship status: Not on file  . Intimate partner violence    Fear of current or ex partner: Not on file    Emotionally abused: Not on file    Physically abused: Not on file    Forced sexual activity: Not on file  Other Topics Concern  . Not on file  Social History Narrative   Married for 49 years. Lives in [place]. One son and four  daughters.     Family History  Problem Relation Age of Onset  . Hypertension Mother   . Breast cancer Mother   . Brain cancer Father 25  . Stomach cancer Other   . Hypertension Sister   . Hypertension Brother   . CVA Maternal Uncle   . Colon cancer Neg Hx   . Esophageal cancer Neg Hx   . Rectal cancer Neg Hx   . Prostate cancer Neg Hx   . Diabetes Neg Hx   . CAD Neg Hx      Allergies as of 11/28/2018   No Known Allergies     Medication List       Accurate as of November 28, 2018 11:59 PM. If you have any questions, ask your nurse or doctor.        amLODipine 10 MG tablet Commonly known as: NORVASC Take 1 tablet (10 mg total) by mouth daily.   carvedilol 6.25 MG tablet Commonly known as: COREG Take 1 tablet (6.25 mg total) by mouth 2 (two) times daily with a meal.   losartan-hydrochlorothiazide 50-12.5 MG tablet  Commonly known as: HYZAAR Take 1 tablet by mouth daily.           Objective:   Physical Exam BP 139/80 (BP Location: Left Arm, Patient Position: Sitting, Cuff Size: Small)   Pulse 65   Temp (!) 95.8 F (35.4 C) (Temporal)   Resp 18   Ht 5\' 10"  (1.778 m)   Wt 199 lb 2 oz (90.3 kg)   SpO2 100%   BMI 28.57 kg/m  General: Well developed, NAD, BMI noted Neck: No  thyromegaly  HEENT:  Normocephalic . Face symmetric, atraumatic Lungs:  CTA B Normal respiratory effort, no intercostal retractions, no accessory muscle use. Heart: RRR,  no murmur.  No pretibial edema bilaterally  Abdomen:  Not distended, soft, non-tender. No rebound or rigidity.   Skin: Exposed areas without rash. Not pale. Not jaundice Neurologic:  alert & oriented X3.  Speech normal, gait appropriate for age and unassisted Strength symmetric and appropriate for age.  Psych: Cognition and judgment appear intact.  Cooperative with normal attention span and concentration.  Behavior appropriate. No anxious or depressed appearing.     Assessment     Assessment  Hyperglycemia  (a1c 6.1 2019) HTN Hyperlipidemia Prostate ca dx 2019; s/p radical prostatectomy 05/2017, finished  XRT 12/2017 GI: --Colon polyps --Iron deficient anemia, --EGD 4-15 : Gastritis, H. pylori positive: Status post treatment. HH likely causing  Cameron erosions ---> a  cause for chronic blood loss. Surgery to correct HH? + H. pylori gastritis 5 /2015,(-) breath test 11-2014   PLAN Here for CPX Hyperglycemia: Recheck A1c HTN: Currently on amlodipine, carvedilol, Hyzaar.  BP today is very good, ambulatory BPs according to the patient in the 120s over 70s when he checks at his pharmacy Hyperlipidemia: 10-year CV RF is 21%, he would benefit from statins, the patient tells me today he will be very reluctant, we are rechecking a FLP, he will work on his lifestyle and consider statins perhaps next year. Prostate cancer: Finished XRT  12/2017, doing well. RTC 1 year

## 2018-11-28 NOTE — Patient Instructions (Addendum)
Please schedule Medicare Wellness with Glenard Haring.   GO TO THE LAB : Get the blood work     GO TO THE FRONT DESK Schedule your next appointment   for a physical exam in 1 year  Continue checking your blood pressures BP GOAL is between 110/65 and  135/85. If it is consistently higher or lower, let me know

## 2018-11-29 NOTE — Assessment & Plan Note (Signed)
Here for CPX Hyperglycemia: Recheck A1c HTN: Currently on amlodipine, carvedilol, Hyzaar.  BP today is very good, ambulatory BPs according to the patient in the 120s over 70s when he checks at his pharmacy Hyperlipidemia: 10-year CV RF is 21%, he would benefit from statins, the patient tells me today he will be very reluctant, we are rechecking a FLP, he will work on his lifestyle and consider statins perhaps next year. Prostate cancer: Finished XRT 12/2017, doing well. RTC 1 year

## 2018-11-29 NOTE — Assessment & Plan Note (Signed)
-  Tdap 2013; PNM shot 2015;  prevnar 11-12-14   - shingrex rx printed last year , benefits d/w pt, he is not sure if will proceed   - flu shot today -CCS: 12-2011 colonoscopy: large polyp  Flex sigmoidoscopy 11 -14  no residual polyp cscope : 05/2017, no polyps, 5 years per report -Prostate cancer dx 02/2017   -Diet and exercise discussed.  -Labs: CMP, FLP, CBC, A1c, TSH

## 2018-12-26 ENCOUNTER — Other Ambulatory Visit: Payer: Self-pay | Admitting: Internal Medicine

## 2018-12-29 ENCOUNTER — Other Ambulatory Visit: Payer: Self-pay | Admitting: Internal Medicine

## 2018-12-29 NOTE — Telephone Encounter (Signed)
Last OV 11/28/18 Last refill 12/21/17 #90/3 Next OV 11/29/19

## 2019-02-09 DIAGNOSIS — C61 Malignant neoplasm of prostate: Secondary | ICD-10-CM | POA: Diagnosis not present

## 2019-02-14 LAB — PSA: PSA: 0.01

## 2019-02-16 DIAGNOSIS — C61 Malignant neoplasm of prostate: Secondary | ICD-10-CM | POA: Diagnosis not present

## 2019-02-16 DIAGNOSIS — N5231 Erectile dysfunction following radical prostatectomy: Secondary | ICD-10-CM | POA: Diagnosis not present

## 2019-02-21 ENCOUNTER — Other Ambulatory Visit: Payer: Self-pay | Admitting: Internal Medicine

## 2019-05-10 DIAGNOSIS — C61 Malignant neoplasm of prostate: Secondary | ICD-10-CM | POA: Diagnosis not present

## 2019-05-17 DIAGNOSIS — N5231 Erectile dysfunction following radical prostatectomy: Secondary | ICD-10-CM | POA: Diagnosis not present

## 2019-05-17 DIAGNOSIS — C61 Malignant neoplasm of prostate: Secondary | ICD-10-CM | POA: Diagnosis not present

## 2019-05-23 ENCOUNTER — Encounter: Payer: Self-pay | Admitting: Internal Medicine

## 2019-08-13 LAB — PSA: PSA: 0.07

## 2019-08-20 DIAGNOSIS — C61 Malignant neoplasm of prostate: Secondary | ICD-10-CM | POA: Diagnosis not present

## 2019-09-03 ENCOUNTER — Encounter: Payer: Self-pay | Admitting: Internal Medicine

## 2019-09-21 ENCOUNTER — Other Ambulatory Visit: Payer: Self-pay | Admitting: Internal Medicine

## 2019-11-16 DIAGNOSIS — C61 Malignant neoplasm of prostate: Secondary | ICD-10-CM | POA: Diagnosis not present

## 2019-11-20 DIAGNOSIS — C61 Malignant neoplasm of prostate: Secondary | ICD-10-CM | POA: Diagnosis not present

## 2019-11-20 DIAGNOSIS — R9721 Rising PSA following treatment for malignant neoplasm of prostate: Secondary | ICD-10-CM | POA: Diagnosis not present

## 2019-11-29 ENCOUNTER — Other Ambulatory Visit: Payer: Self-pay

## 2019-11-29 ENCOUNTER — Encounter: Payer: Self-pay | Admitting: Internal Medicine

## 2019-11-29 ENCOUNTER — Ambulatory Visit (INDEPENDENT_AMBULATORY_CARE_PROVIDER_SITE_OTHER): Payer: Medicare PPO | Admitting: Internal Medicine

## 2019-11-29 VITALS — BP 126/82 | HR 62 | Temp 97.8°F | Resp 18 | Ht 70.0 in | Wt 215.4 lb

## 2019-11-29 DIAGNOSIS — I10 Essential (primary) hypertension: Secondary | ICD-10-CM

## 2019-11-29 DIAGNOSIS — E785 Hyperlipidemia, unspecified: Secondary | ICD-10-CM

## 2019-11-29 DIAGNOSIS — Z23 Encounter for immunization: Secondary | ICD-10-CM | POA: Diagnosis not present

## 2019-11-29 DIAGNOSIS — Z0000 Encounter for general adult medical examination without abnormal findings: Secondary | ICD-10-CM | POA: Diagnosis not present

## 2019-11-29 DIAGNOSIS — R739 Hyperglycemia, unspecified: Secondary | ICD-10-CM

## 2019-11-29 LAB — LIPID PANEL
Cholesterol: 194 mg/dL (ref 0–200)
HDL: 60.7 mg/dL (ref >39.00)
LDL Cholesterol: 119 mg/dL — ABNORMAL HIGH (ref 0–99)
NonHDL: 133.47
Total CHOL/HDL Ratio: 3
Triglycerides: 70 mg/dL (ref 0.0–149.0)
VLDL: 14 mg/dL (ref 0.0–40.0)

## 2019-11-29 LAB — COMPREHENSIVE METABOLIC PANEL
ALT: 26 U/L (ref 0–53)
AST: 24 U/L (ref 0–37)
Albumin: 4.2 g/dL (ref 3.5–5.2)
Alkaline Phosphatase: 65 U/L (ref 39–117)
BUN: 14 mg/dL (ref 6–23)
CO2: 31 mEq/L (ref 19–32)
Calcium: 9.3 mg/dL (ref 8.4–10.5)
Chloride: 104 mEq/L (ref 96–112)
Creatinine, Ser: 1.01 mg/dL (ref 0.40–1.50)
GFR: 74.38 mL/min (ref >60.00)
Glucose, Bld: 102 mg/dL — ABNORMAL HIGH (ref 70–99)
Potassium: 4 mEq/L (ref 3.5–5.1)
Sodium: 139 mEq/L (ref 135–145)
Total Bilirubin: 0.5 mg/dL (ref 0.2–1.2)
Total Protein: 7.1 g/dL (ref 6.0–8.3)

## 2019-11-29 LAB — CBC WITH DIFFERENTIAL/PLATELET
Basophils Absolute: 0 10*3/uL (ref 0.0–0.1)
Basophils Relative: 0.6 % (ref 0.0–3.0)
Eosinophils Absolute: 0.4 10*3/uL (ref 0.0–0.7)
Eosinophils Relative: 7.9 % — ABNORMAL HIGH (ref 0.0–5.0)
HCT: 40.6 % (ref 39.0–52.0)
Hemoglobin: 13.6 g/dL (ref 13.0–17.0)
Lymphocytes Relative: 18.4 % (ref 12.0–46.0)
Lymphs Abs: 0.8 10*3/uL (ref 0.7–4.0)
MCHC: 33.6 g/dL (ref 30.0–36.0)
MCV: 90.3 fl (ref 78.0–100.0)
Monocytes Absolute: 0.4 10*3/uL (ref 0.1–1.0)
Monocytes Relative: 9.2 % (ref 3.0–12.0)
Neutro Abs: 2.9 10*3/uL (ref 1.4–7.7)
Neutrophils Relative %: 63.9 % (ref 43.0–77.0)
Platelets: 247 10*3/uL (ref 150.0–400.0)
RBC: 4.49 Mil/uL (ref 4.22–5.81)
RDW: 16.1 % — ABNORMAL HIGH (ref 11.5–15.5)
WBC: 4.6 10*3/uL (ref 4.0–10.5)

## 2019-11-29 LAB — HEMOGLOBIN A1C: Hgb A1c MFr Bld: 6.1 % (ref 4.6–6.5)

## 2019-11-29 NOTE — Progress Notes (Signed)
Subjective:    Patient ID: Joseph Golden, male    DOB: [birth year], 74 y.o.   MRN: [id number]  DOS:  11/29/2019 Type of visit - description: CPX In general doing well. Has no concerns.    Review of Systems   A 14 point review of systems is negative    Past Medical History:  Diagnosis Date   Allergy    SEASONAL   Diverticulosis 34/74/2595   Helicobacter pylori gastritis 04/23/2013   History of hiatal hernia    HTN (hypertension)    Iron deficiency anemia secondary to blood loss (chronic) - Hiatal hernia with Lysbeth Galas erosions 03/01/2013   Other and unspecified hyperlipidemia 10/31/2012   Personal history of colonic adenoma 12/30/2011   12/2011 - 12 mm adenoma with high-grade dysplasia   Prostate cancer ([place]) 02/2017    Past Surgical History:  Procedure Laterality Date   COLONOSCOPY  2013   LYMPHADENECTOMY Bilateral 06/08/2017   Procedure: LYMPHADENECTOMY;  Surgeon: Lucas Mallow, MD;  Location: [facility];  Service: Urology;  Laterality: Bilateral;   NO PAST SURGERIES     PROSTATE BIOPSY     ROBOT ASSISTED LAPAROSCOPIC RADICAL PROSTATECTOMY N/A 06/08/2017   Procedure: XI ROBOTIC ASSISTED LAPAROSCOPIC RADICAL PROSTATECTOMY;  Surgeon: Lucas Mallow, MD;  Location: [facility];  Service: Urology;  Laterality: N/A;    Allergies as of 11/29/2019   No Known Allergies     Medication List       Accurate as of November 29, 2019 11:59 PM. If you have any questions, ask your nurse or doctor.        amLODipine 10 MG tablet Commonly known as: NORVASC TAKE 1 TABLET BY MOUTH EVERY DAY   carvedilol 6.25 MG tablet Commonly known as: COREG Take 1 tablet (6.25 mg total) by mouth 2 (two) times daily with a meal.   losartan-hydrochlorothiazide 50-12.5 MG tablet Commonly known as: HYZAAR Take 1 tablet by mouth daily.          Objective:   Physical Exam BP 126/82 (BP Location: Left Arm, Patient Position: Sitting, Cuff Size: Normal)    Pulse 62    Temp 97.8  F (36.6 C) (Oral)    Resp 18    Ht 5\' 10"  (1.778 m)    Wt 215 lb 6 oz (97.7 kg)    SpO2 98%    BMI 30.90 kg/m  General: Well developed, NAD, BMI noted Neck: No  thyromegaly  HEENT:  Normocephalic . Face symmetric, atraumatic Lungs:  CTA B Normal respiratory effort, no intercostal retractions, no accessory muscle use. Heart: RRR,  no murmur.  Abdomen:  Not distended, soft, non-tender. No rebound or rigidity.   Lower extremities: no pretibial edema bilaterally  Skin: Exposed areas without rash. Not pale. Not jaundice Neurologic:  alert & oriented X3.  Speech normal, gait appropriate for age and unassisted Strength symmetric and appropriate for age.  Psych: Cognition and judgment appear intact.  Cooperative with normal attention span and concentration.  Behavior appropriate. No anxious or depressed appearing.     Assessment      Assessment  Hyperglycemia  (a1c 6.1 2019) HTN Hyperlipidemia Prostate ca dx 2019; s/p radical prostatectomy 05/2017, finished  XRT 12/2017 GI: --Colon polyps --Iron deficient anemia, --EGD 4-15 : Gastritis, H. pylori positive: Status post treatment. HH likely causing  Cameron erosions ---> a  cause for chronic blood loss. Surgery to correct HH? + H. pylori gastritis 5 /2015,(-) breath test 11-2014   PLAN Here for  CPX Hyperglycemia: Reports healthy lifestyle, checking A1c Hyperlipidemia: Reluctant to take statins so far, states will take "if necessary". History of prostate cancer: Sees urology regularly, no LUTS.  Last PSA was a slightly elevated per patient, they are planning to recheck in February 2022. RTC 1 year    This visit occurred during the SARS-CoV-2 public health emergency.  Safety protocols were in place, including screening questions prior to the visit, additional usage of staff PPE, and extensive cleaning of exam room while observing appropriate contact time as indicated for disinfecting solutions.

## 2019-11-29 NOTE — Patient Instructions (Signed)
Continue checking your blood pressure BP GOAL is between 110/65 and  135/85. If it is consistently higher or lower, let me know  Please see a dentist regularly  [facility] : Get the blood work     [place], [place] back for   a physical exam in 1 year

## 2019-11-30 ENCOUNTER — Encounter: Payer: Self-pay | Admitting: Internal Medicine

## 2019-11-30 NOTE — Assessment & Plan Note (Signed)
-  Tdap 2013 - PNM shot 2015;  prevnar 11-12-14   - shingrex d/w pt before - covid vax x 2, already plans to get a booster  - flu shot today -CCS: 12-2011 colonoscopy: large polyp  Flex sigmoidoscopy 11 -14  no residual polyp cscope : 05/2017, no polyps, 5 years per report -Prostate cancer dx 02/2017 . See comments above. -Diet and exercise discussed.  -Labs:  CMP, FLP, CBC, A1c

## 2019-11-30 NOTE — Assessment & Plan Note (Signed)
Here for CPX Hyperglycemia: Reports healthy lifestyle, checking A1c Hyperlipidemia: Reluctant to take statins so far, states will take "if necessary". History of prostate cancer: Sees urology regularly, no LUTS.  Last PSA was a slightly elevated per patient, they are planning to recheck in February 2022. RTC 1 year

## 2019-12-04 MED ORDER — ATORVASTATIN CALCIUM 20 MG PO TABS: 20.0000 mg | ORAL_TABLET | Freq: Every day | ORAL | 3 refills | Status: DC

## 2019-12-04 NOTE — Addendum Note (Signed)
Addended byDamita Dunnings D on: 12/04/2019 11:31 AM   Modules accepted: Orders

## 2019-12-27 ENCOUNTER — Other Ambulatory Visit: Payer: Self-pay | Admitting: Internal Medicine

## 2020-01-02 DIAGNOSIS — X58XXXA Exposure to other specified factors, initial encounter: Secondary | ICD-10-CM | POA: Diagnosis not present

## 2020-01-02 DIAGNOSIS — I700 Atherosclerosis of aorta: Secondary | ICD-10-CM | POA: Diagnosis not present

## 2020-01-02 DIAGNOSIS — S065X9A Traumatic subdural hemorrhage with loss of consciousness of unspecified duration, initial encounter: Secondary | ICD-10-CM | POA: Diagnosis not present

## 2020-01-02 DIAGNOSIS — Y998 Other external cause status: Secondary | ICD-10-CM | POA: Diagnosis not present

## 2020-01-02 DIAGNOSIS — I720 Aneurysm of carotid artery: Secondary | ICD-10-CM | POA: Diagnosis not present

## 2020-01-02 DIAGNOSIS — G459 Transient cerebral ischemic attack, unspecified: Secondary | ICD-10-CM | POA: Diagnosis not present

## 2020-01-02 DIAGNOSIS — I440 Atrioventricular block, first degree: Secondary | ICD-10-CM | POA: Diagnosis not present

## 2020-01-03 DIAGNOSIS — Z20822 Contact with and (suspected) exposure to covid-19: Secondary | ICD-10-CM | POA: Diagnosis not present

## 2020-01-03 DIAGNOSIS — I700 Atherosclerosis of aorta: Secondary | ICD-10-CM | POA: Diagnosis not present

## 2020-01-03 DIAGNOSIS — X58XXXA Exposure to other specified factors, initial encounter: Secondary | ICD-10-CM | POA: Diagnosis not present

## 2020-01-03 DIAGNOSIS — G935 Compression of brain: Secondary | ICD-10-CM | POA: Diagnosis not present

## 2020-01-03 DIAGNOSIS — S065X0A Traumatic subdural hemorrhage without loss of consciousness, initial encounter: Secondary | ICD-10-CM | POA: Diagnosis not present

## 2020-01-03 DIAGNOSIS — R42 Dizziness and giddiness: Secondary | ICD-10-CM | POA: Diagnosis not present

## 2020-01-03 DIAGNOSIS — I6200 Nontraumatic subdural hemorrhage, unspecified: Secondary | ICD-10-CM | POA: Diagnosis not present

## 2020-01-03 DIAGNOSIS — Y998 Other external cause status: Secondary | ICD-10-CM | POA: Diagnosis not present

## 2020-01-03 DIAGNOSIS — G459 Transient cerebral ischemic attack, unspecified: Secondary | ICD-10-CM | POA: Diagnosis not present

## 2020-01-03 DIAGNOSIS — I6203 Nontraumatic chronic subdural hemorrhage: Secondary | ICD-10-CM | POA: Diagnosis not present

## 2020-01-03 DIAGNOSIS — R531 Weakness: Secondary | ICD-10-CM | POA: Diagnosis not present

## 2020-01-03 DIAGNOSIS — E785 Hyperlipidemia, unspecified: Secondary | ICD-10-CM | POA: Diagnosis not present

## 2020-01-03 DIAGNOSIS — Z781 Physical restraint status: Secondary | ICD-10-CM | POA: Diagnosis not present

## 2020-01-03 DIAGNOSIS — R269 Unspecified abnormalities of gait and mobility: Secondary | ICD-10-CM | POA: Diagnosis not present

## 2020-01-03 DIAGNOSIS — I440 Atrioventricular block, first degree: Secondary | ICD-10-CM | POA: Diagnosis not present

## 2020-01-03 DIAGNOSIS — Z8546 Personal history of malignant neoplasm of prostate: Secondary | ICD-10-CM | POA: Diagnosis not present

## 2020-01-03 DIAGNOSIS — Z79899 Other long term (current) drug therapy: Secondary | ICD-10-CM | POA: Diagnosis not present

## 2020-01-03 DIAGNOSIS — I720 Aneurysm of carotid artery: Secondary | ICD-10-CM | POA: Diagnosis not present

## 2020-01-03 DIAGNOSIS — F1010 Alcohol abuse, uncomplicated: Secondary | ICD-10-CM | POA: Diagnosis not present

## 2020-01-03 DIAGNOSIS — I10 Essential (primary) hypertension: Secondary | ICD-10-CM | POA: Diagnosis not present

## 2020-01-03 DIAGNOSIS — S065X9A Traumatic subdural hemorrhage with loss of consciousness of unspecified duration, initial encounter: Secondary | ICD-10-CM | POA: Diagnosis not present

## 2020-01-03 DIAGNOSIS — Z923 Personal history of irradiation: Secondary | ICD-10-CM | POA: Diagnosis not present

## 2020-01-05 DIAGNOSIS — S065X9A Traumatic subdural hemorrhage with loss of consciousness of unspecified duration, initial encounter: Secondary | ICD-10-CM | POA: Insufficient documentation

## 2020-01-05 DIAGNOSIS — S065XAA Traumatic subdural hemorrhage with loss of consciousness status unknown, initial encounter: Secondary | ICD-10-CM | POA: Insufficient documentation

## 2020-01-22 DIAGNOSIS — S065X9A Traumatic subdural hemorrhage with loss of consciousness of unspecified duration, initial encounter: Secondary | ICD-10-CM | POA: Diagnosis not present

## 2020-02-01 ENCOUNTER — Other Ambulatory Visit: Payer: Self-pay

## 2020-02-04 ENCOUNTER — Other Ambulatory Visit: Payer: Self-pay

## 2020-02-04 ENCOUNTER — Other Ambulatory Visit (INDEPENDENT_AMBULATORY_CARE_PROVIDER_SITE_OTHER): Payer: Medicare PPO

## 2020-02-04 DIAGNOSIS — E785 Hyperlipidemia, unspecified: Secondary | ICD-10-CM

## 2020-02-04 LAB — LIPID PANEL
Cholesterol: 153 mg/dL (ref 0–200)
HDL: 43.3 mg/dL (ref >39.00)
LDL Cholesterol: 94 mg/dL (ref 0–99)
NonHDL: 109.49
Total CHOL/HDL Ratio: 4
Triglycerides: 78 mg/dL (ref 0.0–149.0)
VLDL: 15.6 mg/dL (ref 0.0–40.0)

## 2020-02-04 LAB — AST: AST: 17 U/L (ref 0–37)

## 2020-02-04 LAB — ALT: ALT: 18 U/L (ref 0–53)

## 2020-02-05 DIAGNOSIS — S065X9D Traumatic subdural hemorrhage with loss of consciousness of unspecified duration, subsequent encounter: Secondary | ICD-10-CM | POA: Diagnosis not present

## 2020-02-05 MED ORDER — ATORVASTATIN CALCIUM 20 MG PO TABS: 20.0000 mg | ORAL_TABLET | Freq: Every day | ORAL | 3 refills | Status: DC

## 2020-02-05 NOTE — Addendum Note (Signed)
Addended byDamita Dunnings D on: 02/05/2020 07:38 AM   Modules accepted: Orders

## 2020-02-11 DIAGNOSIS — J3489 Other specified disorders of nose and nasal sinuses: Secondary | ICD-10-CM | POA: Diagnosis not present

## 2020-02-11 DIAGNOSIS — J320 Chronic maxillary sinusitis: Secondary | ICD-10-CM | POA: Diagnosis not present

## 2020-02-11 DIAGNOSIS — S065X0A Traumatic subdural hemorrhage without loss of consciousness, initial encounter: Secondary | ICD-10-CM | POA: Diagnosis not present

## 2020-02-11 DIAGNOSIS — S065X9D Traumatic subdural hemorrhage with loss of consciousness of unspecified duration, subsequent encounter: Secondary | ICD-10-CM | POA: Diagnosis not present

## 2020-02-11 DIAGNOSIS — I6201 Nontraumatic acute subdural hemorrhage: Secondary | ICD-10-CM | POA: Diagnosis not present

## 2020-02-12 ENCOUNTER — Other Ambulatory Visit: Payer: Self-pay | Admitting: Internal Medicine

## 2020-02-14 DIAGNOSIS — R9721 Rising PSA following treatment for malignant neoplasm of prostate: Secondary | ICD-10-CM | POA: Diagnosis not present

## 2020-02-14 LAB — PSA: PSA: 0.95

## 2020-02-16 ENCOUNTER — Other Ambulatory Visit: Payer: Self-pay | Admitting: Internal Medicine

## 2020-02-21 DIAGNOSIS — C61 Malignant neoplasm of prostate: Secondary | ICD-10-CM | POA: Diagnosis not present

## 2020-02-21 DIAGNOSIS — R9721 Rising PSA following treatment for malignant neoplasm of prostate: Secondary | ICD-10-CM | POA: Diagnosis not present

## 2020-04-21 DIAGNOSIS — S065X9D Traumatic subdural hemorrhage with loss of consciousness of unspecified duration, subsequent encounter: Secondary | ICD-10-CM | POA: Diagnosis not present

## 2020-04-21 DIAGNOSIS — S065X9A Traumatic subdural hemorrhage with loss of consciousness of unspecified duration, initial encounter: Secondary | ICD-10-CM | POA: Diagnosis not present

## 2020-04-25 DIAGNOSIS — J019 Acute sinusitis, unspecified: Secondary | ICD-10-CM | POA: Diagnosis not present

## 2020-04-25 DIAGNOSIS — J0100 Acute maxillary sinusitis, unspecified: Secondary | ICD-10-CM | POA: Diagnosis not present

## 2020-04-25 DIAGNOSIS — S065X9A Traumatic subdural hemorrhage with loss of consciousness of unspecified duration, initial encounter: Secondary | ICD-10-CM | POA: Diagnosis not present

## 2020-04-25 DIAGNOSIS — I6200 Nontraumatic subdural hemorrhage, unspecified: Secondary | ICD-10-CM | POA: Diagnosis not present

## 2020-05-15 DIAGNOSIS — R9721 Rising PSA following treatment for malignant neoplasm of prostate: Secondary | ICD-10-CM | POA: Diagnosis not present

## 2020-05-15 LAB — PSA: PSA: 2.29

## 2020-05-22 DIAGNOSIS — R9721 Rising PSA following treatment for malignant neoplasm of prostate: Secondary | ICD-10-CM | POA: Diagnosis not present

## 2020-05-22 DIAGNOSIS — C61 Malignant neoplasm of prostate: Secondary | ICD-10-CM | POA: Diagnosis not present

## 2020-05-22 DIAGNOSIS — R3121 Asymptomatic microscopic hematuria: Secondary | ICD-10-CM | POA: Diagnosis not present

## 2020-05-28 ENCOUNTER — Other Ambulatory Visit (HOSPITAL_COMMUNITY): Payer: Self-pay | Admitting: Urology

## 2020-05-28 DIAGNOSIS — R9721 Rising PSA following treatment for malignant neoplasm of prostate: Secondary | ICD-10-CM

## 2020-05-28 DIAGNOSIS — C61 Malignant neoplasm of prostate: Secondary | ICD-10-CM

## 2020-05-29 ENCOUNTER — Ambulatory Visit: Payer: Medicare PPO | Attending: Internal Medicine

## 2020-05-29 DIAGNOSIS — Z23 Encounter for immunization: Secondary | ICD-10-CM

## 2020-05-29 NOTE — Progress Notes (Signed)
   Covid-19 Vaccination Clinic  Name:  Joseph Golden    MRN: [id number] DOB: 04/26/1945  05/29/2020  [name] was observed post Covid-19 immunization for 15 minutes without incident. He was provided with Vaccine Information Sheet and instruction to access the V-Safe system.   [name] was instructed to call 911 with any severe reactions post vaccine: Marland Kitchen Difficulty breathing  . Swelling of face and throat  . A fast heartbeat  . A bad rash all over body  . Dizziness and weakness   Immunizations Administered    Name Date Dose VIS Date Route   PFIZER Comrnaty(Gray TOP) Covid-19 Vaccine 05/29/2020  9:37 AM 0.3 mL 12/20/2019 Intramuscular   Manufacturer: [place]   Lot: [id number]   [place]: [phone]

## 2020-05-30 ENCOUNTER — Other Ambulatory Visit (HOSPITAL_BASED_OUTPATIENT_CLINIC_OR_DEPARTMENT_OTHER): Payer: Self-pay

## 2020-05-30 MED ORDER — PFIZER-BIONT COVID-19 VAC-TRIS 30 MCG/0.3ML IM SUSP
INTRAMUSCULAR | 0 refills | Status: DC
  Filled 2020-05-30: qty 0.3, 1d supply, fill #0

## 2020-06-10 ENCOUNTER — Ambulatory Visit (HOSPITAL_COMMUNITY)
Admission: RE | Admit: 2020-06-10 | Discharge: 2020-06-10 | Disposition: A | Discharge status: Home / Self Care | Payer: Medicare PPO | Source: Ambulatory Visit | Attending: Urology | Admitting: Urology

## 2020-06-10 ENCOUNTER — Other Ambulatory Visit: Payer: Self-pay

## 2020-06-10 DIAGNOSIS — R9721 Rising PSA following treatment for malignant neoplasm of prostate: Secondary | ICD-10-CM | POA: Diagnosis not present

## 2020-06-10 DIAGNOSIS — C61 Malignant neoplasm of prostate: Secondary | ICD-10-CM | POA: Diagnosis not present

## 2020-06-10 MED ORDER — PIFLIFOLASTAT F 18 (PYLARIFY) INJECTION
9.0000 | Freq: Once | INTRAVENOUS | Status: AC
  Administered 2020-06-10: 9.85 via INTRAVENOUS

## 2020-06-13 ENCOUNTER — Encounter: Payer: Self-pay | Admitting: Internal Medicine

## 2020-06-20 DIAGNOSIS — C61 Malignant neoplasm of prostate: Secondary | ICD-10-CM | POA: Diagnosis not present

## 2020-06-27 DIAGNOSIS — C61 Malignant neoplasm of prostate: Secondary | ICD-10-CM | POA: Diagnosis not present

## 2020-06-27 DIAGNOSIS — R3121 Asymptomatic microscopic hematuria: Secondary | ICD-10-CM | POA: Diagnosis not present

## 2020-06-27 DIAGNOSIS — C7951 Secondary malignant neoplasm of bone: Secondary | ICD-10-CM | POA: Diagnosis not present

## 2020-07-02 ENCOUNTER — Other Ambulatory Visit: Payer: Self-pay | Admitting: Internal Medicine

## 2020-07-25 DIAGNOSIS — C61 Malignant neoplasm of prostate: Secondary | ICD-10-CM | POA: Diagnosis not present

## 2020-08-01 DIAGNOSIS — C61 Malignant neoplasm of prostate: Secondary | ICD-10-CM | POA: Diagnosis not present

## 2020-08-01 DIAGNOSIS — Z5111 Encounter for antineoplastic chemotherapy: Secondary | ICD-10-CM | POA: Diagnosis not present

## 2020-08-08 ENCOUNTER — Ambulatory Visit
Admission: RE | Admit: 2020-08-08 | Discharge: 2020-08-08 | Disposition: A | Discharge status: Home / Self Care | Payer: Medicare PPO | Source: Ambulatory Visit | Attending: Radiation Oncology | Admitting: Radiation Oncology

## 2020-08-08 ENCOUNTER — Ambulatory Visit
Admission: RE | Admit: 2020-08-08 | Discharge: 2020-08-08 | Disposition: A | Discharge status: Home / Self Care | Payer: Medicare PPO | Source: Ambulatory Visit | Attending: Urology | Admitting: Urology

## 2020-08-08 ENCOUNTER — Encounter: Payer: Self-pay | Admitting: Urology

## 2020-08-08 ENCOUNTER — Other Ambulatory Visit: Payer: Self-pay

## 2020-08-08 DIAGNOSIS — C7951 Secondary malignant neoplasm of bone: Secondary | ICD-10-CM | POA: Diagnosis not present

## 2020-08-08 DIAGNOSIS — C61 Malignant neoplasm of prostate: Secondary | ICD-10-CM | POA: Insufficient documentation

## 2020-08-08 NOTE — Progress Notes (Signed)
Patient in for reconsult. States he had a eligard shot on Friday at the urologist that was a six month shot. He had a one month shot a month ago. Patient denies any pain at this time. AUA 1 SHIM is 5. No other issues at this time. No issues at this time.

## 2020-08-08 NOTE — Progress Notes (Signed)
Radiation Oncology         (336) [phone] ________________________________  Outpatient Re-Consultation  Name: TRASON SHIFFLET MRN: [id number]  Date: 08/08/2020  DOB: [birth year]  CC:Paz, Alda Berthold, MD  Lucas Mallow, MD   REFERRING PHYSICIAN: Lucas Mallow, MD  DIAGNOSIS: 75 y.o. 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    ICD-10-CM   1. Malignant neoplasm of prostate metastatic to bone [facility])  C61    C79.51       HISTORY OF PRESENT ILLNESS: BRITTNEY CARAWAY is a 75 y.o. male with a diagnosis of prostate cancer. He was initially diagnosed with Gleason 4+3 adenocarcinoma of the prostate on biopsy 02/03/2017, with a PSA of 8.77 at the time of diagnosis.     We originally met the patient on 03/07/17 to discuss treatment options following a detailed discussion, he opted to proceed with surgical management of his prostate cancer and underwent RALP with BPLND with Dr. Gloriann Loan on 06/08/17.  Final pathology revealed pT3b,N0Mx, Gleason 4+5 adenocarcinoma of the prostate with negative surgical margins but evidence of extraprostatic extension bilaterally at the base with seminal vesicle invasion and lymphovascular invasion. There was no evidence of lymph node involvement in the 7 nodes removed.  His initial postoperative PSA on 07/21/2017 remained detectable at 0.11. He was referred back to [place] on 08/24/17 to discuss adjuvant radiation treatment options. He was started on LT-ADT and subsequently received radiation to the prostatic fossa through 01/02/2018.  He was maintained on ADT for a total of 12 months with his last 29-monthinjection on 08/17/2018.  His PSA remained undetectable until August 2021 when it was noted at 0.070 and continued rising, reaching 2.29 in 05/2020. This prompted a PSMA scan on 06/10/20 which confirmed three foci of intense radiotracer activity involving the left third, left  fifth and right second ribs without evidence of local recurrence in the prostatectomy bed or metastatic adenopathy in the abdomen or pelvis. His most recent PSA from 06/20/20 showed a further rise to 3.35.  He restarted ADT with FMills [place] 06/27/2020 and got a 636-monthligard injection on 08/02/2019.  He has kindly been [place] to discuss the potential role of radiotherapy in the management of oligometastatic disease.  He is accompanied by his daughter for today's visit.  PREVIOUS RADIATION THERAPY: Yes 11/09/17 - 01/02/18: (Concurrent with ADT) 1. The prostate, seminal vesicles, and pelvic lymph nodes were initially treated to 45 Gy in 25 fractions of 1.8 Gy 2. The prostate only was boosted to 75 Gy with 15 additional fractions of 2.0 Gy  PAST MEDICAL HISTORY:  Past Medical History:  Diagnosis Date   Allergy    SEASONAL   Diverticulosis 1136/14/4315 Helicobacter pylori gastritis 04/23/2013   History of hiatal hernia    HTN (hypertension)    Iron deficiency anemia secondary to blood loss (chronic) - Hiatal hernia with CaLysbeth Galasrosions 03/01/2013   Other and unspecified hyperlipidemia 10/31/2012   Personal history of colonic adenoma 12/30/2011   12/2011 - 12 mm adenoma with high-grade dysplasia   [place]      PAST SURGICAL HISTORY: Past Surgical History:  Procedure Laterality Date   COLONOSCOPY  2013   LYMPHADENECTOMY Bilateral 06/08/2017   Procedure: LYMPHADENECTOMY;  Surgeon: BeLucas MallowMD;  [facility];  Service: Urology;  Laterality: Bilateral;   NO PAST  SURGERIES     PROSTATE BIOPSY     ROBOT ASSISTED LAPAROSCOPIC RADICAL PROSTATECTOMY N/A 06/08/2017   Procedure: XI ROBOTIC ASSISTED LAPAROSCOPIC RADICAL PROSTATECTOMY;  Surgeon: Lucas Mallow, MD;  Location[facility];  Service: Urology;  Laterality: N/A;    FAMILY HISTORY:  Family History  Problem Relation Age of Onset   Hypertension Mother    Breast cancer Mother     Brain cancer Father 20   Stomach cancer Other    Hypertension Sister    Hypertension Brother    CVA Maternal Uncle    Colon cancer Neg Hx    Esophageal cancer Neg Hx    Rectal cancer Neg Hx    Prostate cancer Neg Hx    Diabetes Neg Hx    CAD Neg Hx     SOCIAL HISTORY:  Social History   Socioeconomic History   Marital status: Married    Spouse name: Not on file   Number of children: 5   Years of education: Not on file   Highest education level: Not on file  Occupational History   Occupation: retired   Tobacco Use   Smoking status: Former    Types: Pipe    Quit date: 01/12/2003    Years since quitting: 17.5   Smokeless tobacco: Never   Tobacco comments:    quit ~ 2005, smoked pipe   Vaping Use   Vaping Use: Never used  Substance and Sexual Activity   Alcohol use: Yes    Alcohol/week: 2.0 standard drinks    Types: 2 Cans of beer per week    Comment: social   Drug use: No   Sexual activity: Not Currently  Other Topics Concern   Not on file  Social History Narrative   Married for 50  years. Lives in [place]. One son and four daughters.   Social Determinants of Health   Financial Resource Strain: Not on file  Food Insecurity: Not on file  Transportation Needs: Not on file  Physical Activity: Not on file  Stress: Not on file  Social Connections: Not on file  Intimate Partner Violence: Not on file  The patient is retired from working in [employer]. He graduated from [employer].   ALLERGIES: Patient has no known allergies.  MEDICATIONS:  Current Outpatient Medications  Medication Sig Dispense Refill   amLODipine (NORVASC) 10 MG tablet Take 1 tablet (10 mg total) by mouth daily. 90 tablet 1   atorvastatin (LIPITOR) 20 MG tablet Take 1 tablet (20 mg total) by mouth at bedtime. 90 tablet 3   carvedilol (COREG) 6.25 MG tablet Take 1 tablet (6.25 mg total) by mouth 2 (two) times daily with a meal. 180 tablet 1   losartan-hydrochlorothiazide (HYZAAR) 50-12.5 MG  tablet Take 1 tablet by mouth daily. 90 tablet 2   COVID-19 mRNA Vac-TriS, Pfizer, (PFIZER-BIONT COVID-19 VAC-TRIS) SUSP injection Inject into the muscle. (Patient not taking: Reported on 08/08/2020) 0.3 mL 0   No current facility-administered medications for this encounter.    REVIEW OF SYSTEMS:  On review of systems, the patient reports that he is doing well overall. He denies any chest pain, shortness of breath, cough, fevers, chills, or night sweats. He denies any bowel disturbances, and denies abdominal pain, nausea or vomiting.  His IPSS is 1, indicating mild urinary symptoms with nocturia x1 only.  He specifically denies dysuria, gross hematuria, straining to void, incomplete bladder emptying or incontinence.  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  A complete review of systems is obtained and is otherwise negative.    PHYSICAL EXAM:  Wt Readings from Last 3 Encounters:  08/08/20 208 lb 12.8 oz (94.7 kg)  11/29/19 215 lb 6 oz (97.7 kg)  11/28/18 199 lb 2 oz (90.3 kg)   Temp Readings from Last 3 Encounters:  08/08/20 97.7 F (36.5 C)  11/29/19 97.8 F (36.6 C) (Oral)  11/28/18 (!) 95.8 F (35.4 C) (Temporal)   BP Readings from Last 3 Encounters:  08/08/20 107/77  11/29/19 126/82  11/28/18 139/80   Pulse Readings from Last 3 Encounters:  08/08/20 65  11/29/19 62  11/28/18 65   Pain Assessment Pain Score: 0-No pain/10  In general this is a well appearing African American man in no acute distress. He's alert and oriented x4 and appropriate throughout the examination. Cardiopulmonary assessment is negative for acute distress and he exhibits normal effort.     KPS = 90  100 - Normal; no complaints; no evidence of disease. 90   - Able to carry on normal activity; minor signs or symptoms of disease. 80   - Normal activity with effort; some signs or symptoms of disease. 55   - Cares for self; unable to carry on normal activity or to do active  work. 60   - Requires occasional assistance, but is able to care for most of his personal needs. 50   - Requires considerable assistance and frequent medical care. 71   - Disabled; requires special care and assistance. 78   - Severely disabled; hospital admission is indicated although death not imminent. 69   - Very sick; hospital admission necessary; active supportive treatment necessary. 10   - Moribund; fatal processes progressing rapidly. 0     - Dead  Karnofsky DA, Abelmann Lawrence, Craver LS and Burchenal [facility] [phone]) The use of the nitrogen mustards in the palliative treatment of carcinoma: with particular reference to bronchogenic carcinoma Cancer 1 634-56  LABORATORY DATA:  Lab Results  Component Value Date   WBC 4.6 11/29/2019   HGB 13.6 11/29/2019   HCT 40.6 11/29/2019   MCV 90.3 11/29/2019   PLT 247.0 11/29/2019   Lab Results  Component Value Date   NA 139 11/29/2019   K 4.0 11/29/2019   CL 104 11/29/2019   CO2 31 11/29/2019   Lab Results  Component Value Date   ALT 18 02/04/2020   AST 17 02/04/2020   ALKPHOS 65 11/29/2019   BILITOT 0.5 11/29/2019     RADIOGRAPHY: No results found.    IMPRESSION/PLAN: 1. 75 y.o. 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 Today, we talked to the patient and family about the findings and workup thus far. We discussed the natural history of metastatic prostate cancer and general treatment, highlighting the role of radiotherapy in the management of oligometastatic disease. We discussed the available radiation techniques, and focused on the details and logistics of delivery.  The recommendation is to proceed with 5 fractions of stereotactic body radiotherapy (SBRT) focused on the three rib lesions. We reviewed the anticipated acute and late sequelae associated with radiation in this setting. The patient and his daughter were  encouraged to ask questions that were answered to their satisfaction.  At the end of our conversation, the patient is in agreement to proceed with the recommended 5 fraction course of SBRT to the 3 rib lesions.  He  has freely signed written consent to proceed today in the office and a copy of this document will be placed in his medical record.  We will share our discussion with Dr. Gloriann Loan and move forward with treatment planning accordingly. He is scheduled for CT simulation on Monday 08/11/20 at 8 am, in anticipation of beginning his radiation treatments in the near future. He prefers early morning treatment times so I will share this with our team and we will do our best to accommodate.  We enjoyed meeting with him and his daughter today and look forward to continuing to participate in his care.    Nicholos Johns, PA-C    Tyler Pita, MD  [facility] Direct Dial: [phone]  Fax: [phone] [email or web address]  Skype  [email or web address]    This document serves as a record of services personally performed by Tyler Pita, MD and Freeman Caldron, PA-C. It was created on their behalf by Wilburn Mylar, a trained medical scribe. The creation of this record is based on the scribe's personal observations and the provider's statements to them. This document has been checked and approved by the attending provider.

## 2020-08-10 NOTE — Progress Notes (Signed)
  Radiation Oncology         [phone]) [phone] ________________________________  Name: Joseph Golden MRN: [id number]  Date: 08/11/2020  DOB: Oct 14, 1945  STEREOTACTIC BODY RADIOTHERAPY SIMULATION AND TREATMENT PLANNING NOTE    ICD-10-CM   1. Malignant neoplasm of prostate metastatic to bone [facility])  C61    C79.51       DIAGNOSIS:  75 y.o. 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  NARRATIVE:  The patient was brought to the [facility].  Identity was confirmed.  All relevant records and images related to the planned course of therapy were reviewed.  The patient freely provided informed written consent to proceed with treatment after reviewing the details related to the planned course of therapy. The consent form was witnessed and verified by the simulation staff.  Then, the patient was set-up in a stable reproducible  supine position for radiation therapy.  A BodyFix immobilization pillow was fabricated for reproducible positioning.  Surface markings were placed.  The CT images were loaded into the planning software.  The gross target volumes (GTV) and planning target volumes (PTV) were delinieated, and avoidance structures were contoured.  Treatment planning then occurred.  The radiation prescription was entered and confirmed.  A total of two complex treatment devices were fabricated in the form of the BodyFix immobilization pillow and a neck accuform cushion.  I have requested : 3D Simulation  I have requested a DVH of the following structures: targets and all normal structures near the target including left lung, right lung, spinal cord, skin and chest wall as noted on the radiation plan to maintain doses in adherence with established limits  SPECIAL TREATMENT PROCEDURE:  The planned course of therapy using radiation constitutes a special treatment procedure. Special care  is required in the management of this patient for the following reasons. High dose per fraction requiring special monitoring for increased toxicities of treatment including daily imaging..  The special nature of the planned course of radiotherapy will require increased physician supervision and oversight to ensure patient's safety with optimal treatment outcomes.    This requires extended time and effort.    PLAN:  The patient will receive 40 Gy in 5 fractions to three rib metastases.  ________________________________  Sheral Apley Tammi Klippel, M.D.

## 2020-08-11 ENCOUNTER — Other Ambulatory Visit: Payer: Self-pay

## 2020-08-11 ENCOUNTER — Ambulatory Visit
Admission: RE | Admit: 2020-08-11 | Discharge: 2020-08-11 | Disposition: A | Discharge status: Home / Self Care | Payer: Medicare PPO | Source: Ambulatory Visit | Attending: Radiation Oncology | Admitting: Radiation Oncology

## 2020-08-11 DIAGNOSIS — C7951 Secondary malignant neoplasm of bone: Secondary | ICD-10-CM | POA: Diagnosis not present

## 2020-08-11 DIAGNOSIS — Z510 Encounter for antineoplastic radiation therapy: Secondary | ICD-10-CM | POA: Insufficient documentation

## 2020-08-11 DIAGNOSIS — C61 Malignant neoplasm of prostate: Secondary | ICD-10-CM | POA: Insufficient documentation

## 2020-08-13 DIAGNOSIS — C61 Malignant neoplasm of prostate: Secondary | ICD-10-CM | POA: Diagnosis not present

## 2020-08-13 DIAGNOSIS — Z510 Encounter for antineoplastic radiation therapy: Secondary | ICD-10-CM | POA: Diagnosis not present

## 2020-08-13 DIAGNOSIS — C7951 Secondary malignant neoplasm of bone: Secondary | ICD-10-CM | POA: Diagnosis not present

## 2020-08-20 ENCOUNTER — Ambulatory Visit
Admission: RE | Admit: 2020-08-20 | Discharge: 2020-08-20 | Disposition: A | Discharge status: Home / Self Care | Payer: Medicare PPO | Source: Ambulatory Visit | Attending: Radiation Oncology | Admitting: Radiation Oncology

## 2020-08-20 ENCOUNTER — Other Ambulatory Visit: Payer: Self-pay

## 2020-08-20 DIAGNOSIS — C61 Malignant neoplasm of prostate: Secondary | ICD-10-CM | POA: Diagnosis not present

## 2020-08-20 DIAGNOSIS — Z510 Encounter for antineoplastic radiation therapy: Secondary | ICD-10-CM | POA: Diagnosis not present

## 2020-08-20 DIAGNOSIS — C7951 Secondary malignant neoplasm of bone: Secondary | ICD-10-CM | POA: Diagnosis not present

## 2020-08-21 ENCOUNTER — Ambulatory Visit: Payer: Medicare PPO | Admitting: Radiation Oncology

## 2020-08-22 ENCOUNTER — Ambulatory Visit
Admission: RE | Admit: 2020-08-22 | Discharge: 2020-08-22 | Disposition: A | Discharge status: Home / Self Care | Payer: Medicare PPO | Source: Ambulatory Visit | Attending: Radiation Oncology | Admitting: Radiation Oncology

## 2020-08-22 ENCOUNTER — Other Ambulatory Visit: Payer: Self-pay

## 2020-08-22 DIAGNOSIS — C7951 Secondary malignant neoplasm of bone: Secondary | ICD-10-CM

## 2020-08-22 DIAGNOSIS — C61 Malignant neoplasm of prostate: Secondary | ICD-10-CM | POA: Diagnosis not present

## 2020-08-22 DIAGNOSIS — Z510 Encounter for antineoplastic radiation therapy: Secondary | ICD-10-CM | POA: Diagnosis not present

## 2020-08-25 ENCOUNTER — Other Ambulatory Visit: Payer: Self-pay

## 2020-08-25 ENCOUNTER — Ambulatory Visit
Admission: RE | Admit: 2020-08-25 | Discharge: 2020-08-25 | Disposition: A | Discharge status: Home / Self Care | Payer: Medicare PPO | Source: Ambulatory Visit | Attending: Radiation Oncology | Admitting: Radiation Oncology

## 2020-08-25 DIAGNOSIS — C61 Malignant neoplasm of prostate: Secondary | ICD-10-CM

## 2020-08-25 DIAGNOSIS — Z510 Encounter for antineoplastic radiation therapy: Secondary | ICD-10-CM | POA: Diagnosis not present

## 2020-08-25 DIAGNOSIS — C7951 Secondary malignant neoplasm of bone: Secondary | ICD-10-CM | POA: Diagnosis not present

## 2020-08-26 ENCOUNTER — Ambulatory Visit: Payer: Medicare PPO | Admitting: Radiation Oncology

## 2020-08-27 ENCOUNTER — Ambulatory Visit
Admission: RE | Admit: 2020-08-27 | Discharge: 2020-08-27 | Disposition: A | Discharge status: Home / Self Care | Payer: Medicare PPO | Source: Ambulatory Visit | Attending: Radiation Oncology | Admitting: Radiation Oncology

## 2020-08-27 ENCOUNTER — Other Ambulatory Visit: Payer: Self-pay

## 2020-08-27 DIAGNOSIS — C7951 Secondary malignant neoplasm of bone: Secondary | ICD-10-CM

## 2020-08-27 DIAGNOSIS — C61 Malignant neoplasm of prostate: Secondary | ICD-10-CM | POA: Diagnosis not present

## 2020-08-27 DIAGNOSIS — Z510 Encounter for antineoplastic radiation therapy: Secondary | ICD-10-CM | POA: Diagnosis not present

## 2020-08-29 ENCOUNTER — Ambulatory Visit
Admission: RE | Admit: 2020-08-29 | Discharge: 2020-08-29 | Disposition: A | Discharge status: Home / Self Care | Payer: Medicare PPO | Source: Ambulatory Visit | Attending: Radiation Oncology | Admitting: Radiation Oncology

## 2020-08-29 ENCOUNTER — Encounter: Payer: Self-pay | Admitting: Urology

## 2020-08-29 ENCOUNTER — Other Ambulatory Visit: Payer: Self-pay

## 2020-08-29 DIAGNOSIS — C7951 Secondary malignant neoplasm of bone: Secondary | ICD-10-CM

## 2020-08-29 DIAGNOSIS — Z510 Encounter for antineoplastic radiation therapy: Secondary | ICD-10-CM | POA: Diagnosis not present

## 2020-08-29 DIAGNOSIS — C61 Malignant neoplasm of prostate: Secondary | ICD-10-CM

## 2020-09-28 ENCOUNTER — Other Ambulatory Visit: Payer: Self-pay | Admitting: Internal Medicine

## 2020-10-14 ENCOUNTER — Encounter: Payer: Self-pay | Admitting: Urology

## 2020-10-14 NOTE — Progress Notes (Signed)
Patient reports doing well. No symptoms reported at this time.   No urinary management medications taken at this time. Urology follow up w/ Dr. Gloriann Loan at [facility]" on December 21st at 8:30am.  I-PSS Score of 2 (mild).  Meaningful use complete.  Patient notified of 10:00am-10/15/20 telephone appointment and understands.

## 2020-10-15 ENCOUNTER — Ambulatory Visit
Admission: RE | Admit: 2020-10-15 | Discharge: 2020-10-15 | Disposition: A | Discharge status: Home / Self Care | Payer: Medicare PPO | Source: Ambulatory Visit | Attending: Urology | Admitting: Urology

## 2020-10-15 DIAGNOSIS — C61 Malignant neoplasm of prostate: Secondary | ICD-10-CM

## 2020-10-15 NOTE — Progress Notes (Signed)
  Radiation Oncology         (336) [phone] ________________________________  Name: Joseph Golden MRN: [id number]  Date: 08/29/2020  DOB: 1946/01/04  End of Treatment Note  Diagnosis:   75 y.o. 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     Indication for treatment:  Curative, Definitive SBRT       Radiation treatment dates:   08/20/20 - 08/29/20  Site/dose:   The targets in the left third, left fifth and right second ribs were treated to 40 Gy in 5 fractions of 8 Gy  Beams/energy:   The patient was treated using stereotactic body radiotherapy according to a 3D conformal radiotherapy plan.  Volumetric arc fields were employed to deliver 6 MV X-rays.  Image guidance was performed with per fraction cone beam CT prior to treatment under personal MD supervision.  Immobilization was achieved using BodyFix Pillow.  Narrative: The patient tolerated radiation treatment relatively well and remained without complaints throughout treatment.  Plan: The patient has completed radiation treatment. The patient will return to radiation oncology clinic for routine followup in one month. I advised them to call or return sooner if they have any questions or concerns related to their recovery or treatment. ________________________________  Sheral Apley. Tammi Klippel, M.D.

## 2020-10-15 NOTE — Progress Notes (Signed)
Radiation Oncology         [phone]) [phone] ________________________________  Name: Joseph Golden MRN: [id number]  Date: 10/15/2020  DOB: 1945/04/01  Post Treatment Note  CC: Joseph Branch, MD  Joseph Mallow, MD  Diagnosis:   75 y.o. 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  Interval Since Last Radiation:  6.5 weeks (concurrent with ADT)  08/20/20 - 08/29/20: The targets in the left third, left fifth and right second ribs were treated to 40 Gy in 5 fractions of 8 Gy  11/09/17 - 01/02/18: (Concurrent with ADT) 1. The prostate, seminal vesicles, and pelvic lymph nodes were initially treated to 45 Gy in 25 fractions of 1.8 Gy 2. The prostate only was boosted to 75 Gy with 15 additional fractions of 2.0 Gy  Narrative:  I spoke with the patient to conduct his routine scheduled 1 month follow up visit via telephone to spare the patient unnecessary potential exposure in the healthcare setting during the current COVID-19 pandemic.  The patient was notified in advance and gave permission to proceed with this visit format. He tolerated radiation treatment relatively well and remained without complaints throughout treatment.                              On review of systems, the patient states that he is doing very well in general and is currently without complaints.  He did not have any ill side effects throughout the course of treatment and has not developed any since completion.  He specifically denies pain, shortness of breath, productive cough, fever, chills or night sweats.  He denies any significant change in his energy level and has been able to remain active.  Overall, he is quite pleased with his progress to date.  ALLERGIES:  has No Known Allergies.  Meds: Current Outpatient Medications  Medication Sig Dispense Refill   amLODipine (NORVASC) 10 MG tablet TAKE 1 TABLET BY  MOUTH EVERY DAY 90 tablet 1   atorvastatin (LIPITOR) 20 MG tablet Take 1 tablet (20 mg total) by mouth at bedtime. 90 tablet 3   carvedilol (COREG) 6.25 MG tablet Take 1 tablet (6.25 mg total) by mouth 2 (two) times daily with a meal. 180 tablet 1   COVID-19 mRNA Vac-TriS, Pfizer, (PFIZER-BIONT COVID-19 VAC-TRIS) SUSP injection Inject into the muscle. (Patient not taking: Reported on 08/08/2020) 0.3 mL 0   losartan-hydrochlorothiazide (HYZAAR) 50-12.5 MG tablet Take 1 tablet by mouth daily. 90 tablet 2   No current facility-administered medications for this encounter.    Physical Findings:  vitals were not taken for this visit.  Pain Assessment Pain Score: 0-No pain/10 Unable to assess due to telephone follow-up visit format.  Lab Findings: Lab Results  Component Value Date   WBC 4.6 11/29/2019   HGB 13.6 11/29/2019   HCT 40.6 11/29/2019   MCV 90.3 11/29/2019   PLT 247.0 11/29/2019     Radiographic Findings: No results found.  Impression/Plan: 1. 75 y.o. 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 The patient appears to have recovered well from the effects of his recent SBRT and is currently without complaints.  We discussed that while we are happy to continue to participate in his care if clinically indicated, at this  point, we will plan to see him back on an as-needed basis.  He will continue in routine follow-up under the care and direction of Dr. Gloriann Golden at [facility] with his next scheduled visit coming up on 12/31/2020.  We enjoyed taking care of him and look forward to continuing to follow his progress via correspondence.  He knows that he is welcome to call at anytime with any questions or concerns related to his radiation.    Nicholos Johns, PA-C

## 2020-10-20 ENCOUNTER — Other Ambulatory Visit: Payer: Self-pay | Admitting: Urology

## 2020-10-20 DIAGNOSIS — C7951 Secondary malignant neoplasm of bone: Secondary | ICD-10-CM

## 2020-10-20 DIAGNOSIS — C61 Malignant neoplasm of prostate: Secondary | ICD-10-CM

## 2020-10-20 NOTE — Addendum Note (Signed)
Addended by: Nicholos Johns on: 10/20/2020 11:31 AM   Modules accepted: Orders

## 2020-11-13 ENCOUNTER — Telehealth: Payer: Self-pay | Admitting: *Deleted

## 2020-11-25 ENCOUNTER — Other Ambulatory Visit: Payer: Self-pay | Admitting: Internal Medicine

## 2020-12-01 ENCOUNTER — Ambulatory Visit (INDEPENDENT_AMBULATORY_CARE_PROVIDER_SITE_OTHER): Payer: Medicare PPO | Admitting: Internal Medicine

## 2020-12-01 ENCOUNTER — Other Ambulatory Visit: Payer: Self-pay

## 2020-12-01 ENCOUNTER — Encounter: Payer: Self-pay | Admitting: Internal Medicine

## 2020-12-01 ENCOUNTER — Ambulatory Visit: Payer: Medicare PPO | Attending: Internal Medicine

## 2020-12-01 VITALS — BP 124/80 | HR 66 | Temp 97.8°F | Resp 16 | Ht 70.0 in | Wt 209.4 lb

## 2020-12-01 DIAGNOSIS — I10 Essential (primary) hypertension: Secondary | ICD-10-CM

## 2020-12-01 DIAGNOSIS — Z23 Encounter for immunization: Secondary | ICD-10-CM

## 2020-12-01 DIAGNOSIS — E785 Hyperlipidemia, unspecified: Secondary | ICD-10-CM

## 2020-12-01 DIAGNOSIS — Z0000 Encounter for general adult medical examination without abnormal findings: Secondary | ICD-10-CM | POA: Diagnosis not present

## 2020-12-01 DIAGNOSIS — R739 Hyperglycemia, unspecified: Secondary | ICD-10-CM

## 2020-12-01 LAB — COMPREHENSIVE METABOLIC PANEL
ALT: 20 U/L (ref 0–53)
AST: 21 U/L (ref 0–37)
Albumin: 4.2 g/dL (ref 3.5–5.2)
Alkaline Phosphatase: 77 U/L (ref 39–117)
BUN: 15 mg/dL (ref 6–23)
CO2: 29 mEq/L (ref 19–32)
Calcium: 9.9 mg/dL (ref 8.4–10.5)
Chloride: 104 mEq/L (ref 96–112)
Creatinine, Ser: 0.97 mg/dL (ref 0.40–1.50)
GFR: 77.53 mL/min (ref >60.00)
Glucose, Bld: 105 mg/dL — ABNORMAL HIGH (ref 70–99)
Potassium: 4.6 mEq/L (ref 3.5–5.1)
Sodium: 139 mEq/L (ref 135–145)
Total Bilirubin: 0.5 mg/dL (ref 0.2–1.2)
Total Protein: 7.2 g/dL (ref 6.0–8.3)

## 2020-12-01 LAB — CBC WITH DIFFERENTIAL/PLATELET
Basophils Absolute: 0 10*3/uL (ref 0.0–0.1)
Basophils Relative: 0.5 % (ref 0.0–3.0)
Eosinophils Absolute: 0.3 10*3/uL (ref 0.0–0.7)
Eosinophils Relative: 7.3 % — ABNORMAL HIGH (ref 0.0–5.0)
HCT: 37.1 % — ABNORMAL LOW (ref 39.0–52.0)
Hemoglobin: 12.3 g/dL — ABNORMAL LOW (ref 13.0–17.0)
Lymphocytes Relative: 14.6 % (ref 12.0–46.0)
Lymphs Abs: 0.6 10*3/uL — ABNORMAL LOW (ref 0.7–4.0)
MCHC: 33.3 g/dL (ref 30.0–36.0)
MCV: 91.7 fl (ref 78.0–100.0)
Monocytes Absolute: 0.4 10*3/uL (ref 0.1–1.0)
Monocytes Relative: 10.6 % (ref 3.0–12.0)
Neutro Abs: 2.8 10*3/uL (ref 1.4–7.7)
Neutrophils Relative %: 67 % (ref 43.0–77.0)
Platelets: 248 10*3/uL (ref 150.0–400.0)
RBC: 4.04 Mil/uL — ABNORMAL LOW (ref 4.22–5.81)
RDW: 15.4 % (ref 11.5–15.5)
WBC: 4.1 10*3/uL (ref 4.0–10.5)

## 2020-12-01 LAB — LIPID PANEL
Cholesterol: 236 mg/dL — ABNORMAL HIGH (ref 0–200)
HDL: 61.1 mg/dL (ref >39.00)
LDL Cholesterol: 156 mg/dL — ABNORMAL HIGH (ref 0–99)
NonHDL: 174.5
Total CHOL/HDL Ratio: 4
Triglycerides: 91 mg/dL (ref 0.0–149.0)
VLDL: 18.2 mg/dL (ref 0.0–40.0)

## 2020-12-01 LAB — HEMOGLOBIN A1C: Hgb A1c MFr Bld: 6.1 % (ref 4.6–6.5)

## 2020-12-01 NOTE — Progress Notes (Signed)
Subjective:    Patient ID: Joseph Golden, male    DOB: 06-Nov-1945, 75 y.o.   MRN: [id number]  DOS:  12/01/2020 Type of visit - description: CPX  Since the last visit here 11-2019  he was dx w/ a SDH. Chart reviewed.  He presented to the ER 01-03-2020 because wife noted something was wrong. At the ER  on exam he was alert, responsive, normal speech, work-up showed a    SDH with brain compression, 1.5 cm of ML shift . 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 He was admitted to an outside hospital had a SEPS, it was  removed 01/04/2021. Subsequently was discharged home.  Also, was found to have bone metastasis from prostate cancer, treated w/ XRT  Review of Systems At this point he feels well w/o unusual sxs   A 14 point review of systems is negative    Past Medical History:  Diagnosis Date   Allergy    SEASONAL   Diverticulosis 40/98/1191   Helicobacter pylori gastritis 04/23/2013   History of hiatal hernia    HTN (hypertension)    Iron deficiency anemia secondary to blood loss (chronic) - Hiatal hernia with Lysbeth Galas erosions 03/01/2013   Other and unspecified hyperlipidemia 10/31/2012   Personal history of colonic adenoma 12/30/2011   12/2011 - 12 mm adenoma with high-grade dysplasia   Prostate cancer ([place]) 02/2017    Past Surgical History:  Procedure Laterality Date   COLONOSCOPY  2013   LYMPHADENECTOMY Bilateral 06/08/2017   Procedure: LYMPHADENECTOMY;  Surgeon: Lucas Mallow, MD;  Location: [facility];  Service: Urology;  Laterality: Bilateral;   NO PAST SURGERIES     PROSTATE BIOPSY     ROBOT ASSISTED LAPAROSCOPIC RADICAL PROSTATECTOMY N/A 06/08/2017   Procedure: XI ROBOTIC ASSISTED LAPAROSCOPIC RADICAL PROSTATECTOMY;  Surgeon: Lucas Mallow, MD;  Location: [facility];  Service: Urology;  Laterality: N/A;   Social History   Socioeconomic History   Marital status: Married    Spouse name: Not on file   Number of children: 5   Years of education:  Not on file   Highest education level: Not on file  Occupational History   Occupation: retired   Tobacco Use   Smoking status: Former    Types: Pipe    Quit date: 01/12/2003    Years since quitting: 17.8   Smokeless tobacco: Never   Tobacco comments:    quit ~ 2005, smoked pipe   Vaping Use   Vaping Use: Never used  Substance and Sexual Activity   Alcohol use: Yes    Alcohol/week: 2.0 standard drinks    Types: 2 Cans of beer per week    Comment: social   Drug use: No   Sexual activity: Not Currently  Other Topics Concern   Not on file  Social History Narrative   Married for 50  years. Lives in [place]. One son and four daughters.   Household: wife, 2 daughters and 2 g-son   Social Determinants of Health   Financial Resource Strain: Not on file  Food Insecurity: Not on file  Transportation Needs: Not on file  Physical Activity: Not on file  Stress: Not on file  Social Connections: Not on file  Intimate Partner Violence: Not on file     Allergies as of 12/01/2020   No Known Allergies      Medication List        Accurate as of December 01, 2020  8:32 PM. If you have any questions, ask your nurse or doctor.          STOP taking these medications    Pfizer-BioNT COVID-19 Vac-TriS Susp injection Generic drug: COVID-19 mRNA Vac-TriS Therapist, music) Stopped by: Kathlene November, MD       TAKE these medications    amLODipine 10 MG tablet Commonly known as: NORVASC TAKE 1 TABLET BY MOUTH EVERY DAY   atorvastatin 20 MG tablet Commonly known as: LIPITOR Take 1 tablet (20 mg total) by mouth at bedtime.   carvedilol 6.25 MG tablet Commonly known as: COREG Take 1 tablet (6.25 mg total) by mouth 2 (two) times daily with a meal.   losartan-hydrochlorothiazide 50-12.5 MG tablet Commonly known as: HYZAAR TAKE 1 TABLET BY MOUTH EVERY DAY           Objective:   Physical Exam BP 124/80 (BP Location: Left Arm, Patient Position: Sitting, Cuff Size: Small)   Pulse 66    Temp 97.8 F (36.6 C) (Oral)   Resp 16   Ht 5\' 10"  (1.778 m)   Wt 209 lb 6 oz (95 kg)   SpO2 98%   BMI 30.04 kg/m  General: Well developed, NAD, BMI noted Neck: No  thyromegaly  HEENT:  Normocephalic . Face symmetric, atraumatic Lungs:  CTA B Normal respiratory effort, no intercostal retractions, no accessory muscle use. Heart: RRR,  no murmur.  Abdomen:  Not distended, soft, non-tender. No rebound or rigidity.   Lower extremities: no pretibial edema bilaterally  Skin: Exposed areas without rash. Not pale. Not jaundice Neurologic:  alert & oriented X3.  Speech normal, gait appropriate for age and unassisted Strength symmetric and appropriate for age.  Psych: Cognition and judgment appear intact.  Cooperative with normal attention span and concentration.  Behavior appropriate. No anxious or depressed appearing.     Assessment    Assessment  Hyperglycemia  (a1c 6.1 2019) HTN Hyperlipidemia Prostate ca dx 2019; s/p radical prostatectomy 05/2017, finished  XRT 12/2017 GI: --Colon polyps --Iron deficient anemia, --EGD 4-15 : Gastritis, H. pylori positive: Status post treatment. HH likely causing  Cameron erosions ---> a  cause for chronic blood loss.  + H. pylori gastritis 5 /2015,(-) breath test 11-2014  12/2019: Subdural hematoma, large, s/p SEPS  PLAN Here for CPX Hyperglycemia: Labs KCL:[id number] well controlled recommend continue amlodipine, carvedilol, Hyzaar.  Labs, recommend to monitor home Hyperlipidemia: On Lipitor, labs. Prostate cancer: See comments under physical exam Subdural hematoma 12/2019, admitted to [facility], had a SEPS, last follow-up with neurosurgery 04/2020, at the time they recommended CT and if stable follow-up as needed.  CT 04/25/2020 showed decreasing size of subdural hematoma. EtOH?  Recommend moderation RTC 6 months     This visit occurred during the SARS-CoV-2 public health emergency.  Safety protocols were in place, including  screening questions prior to the visit, additional usage of staff PPE, and extensive cleaning of exam room while observing appropriate contact time as indicated for disinfecting solutions.

## 2020-12-01 NOTE — Patient Instructions (Signed)
Proceed with a COVID-vaccine as you are planning.  Consider shingles vaccination  Check the  blood pressure twice a month BP GOAL is between 110/65 and  135/85. If it is consistently higher or lower, let me know the average.    GO TO THE LAB : Get the blood work     [place], [place] back for   a checkup in 6 months      "Living will", "Union of attorney": Advanced care planning  (If you already have a living will or healthcare power of attorney, please bring the copy to be scanned in your chart.)  Advance care planning is a process that supports adults in  understanding and sharing their preferences regarding future medical care.   The patient's preferences are recorded in documents called Advance Directives.    Advanced directives are completed (and can be modified at any time) while the patient is in full mental capacity.   The documentation should be available at all times to the patient, the family and the healthcare providers.  Bring in a copy to be scanned in your chart is an excellent idea and is recommended   This legal documents direct treatment decision making and/or appoint a surrogate to make the decision if the patient is not capable to do so.    Advance directives can be documented in many types of formats,  documents have names such as:  Lliving will  Durable power of attorney for healthcare (healthcare proxy or healthcare power of attorney)  Combined directives  Physician orders for life-sustaining treatment    More information at:  [email or web address]

## 2020-12-01 NOTE — Assessment & Plan Note (Signed)
-  Tdap 2013 - PNM shot 2015;  prevnar 13: 11-12-14   - shingrex d/w pt  - covid vax booster rec   - flu shot today -[facility]: 12-2011 colonoscopy: large polyp  Flex sigmoidoscopy 11 -14  no residual polyp cscope : 05/2017, no polyps, 5 years per report -Prostate cancer dx 02/2017 .  Since the last time I saw him, mets were found, PSA November 2021: 331. PSA 07/25/2020: 0.3, had radiation therapy -Diet and exercise discussed.  -Labs:  CMP FLP CBC A1c -POA discussed

## 2020-12-01 NOTE — Progress Notes (Signed)
   Covid-19 Vaccination Clinic  Name:  Joseph Golden    MRN: [id number] DOB: [birth year]  12/01/2020  Joseph Golden was observed post Covid-19 immunization for 15 minutes without incident. He was provided with Vaccine Information Sheet and instruction to access the V-Safe system.   Joseph Golden was instructed to call 911 with any severe reactions post vaccine: Difficulty breathing  Swelling of face and throat  A fast heartbeat  A bad rash all over body  Dizziness and weakness   Immunizations Administered     Name Date Dose VIS Date Route   Pfizer Covid-19 Vaccine Bivalent Booster 12/01/2020  9:58 AM 0.3 mL 09/10/2020 Intramuscular   Manufacturer: [employer]   Lot: [id number]   [place]: [phone]

## 2020-12-01 NOTE — Assessment & Plan Note (Signed)
Assessment  Hyperglycemia  (a1c 6.1 2019) HTN Hyperlipidemia Prostate ca dx 2019; s/p radical prostatectomy 05/2017, finished  XRT 12/2017 GI: --Colon polyps --Iron deficient anemia, --EGD 4-15 : Gastritis, H. pylori positive: Status post treatment. HH likely causing  Cameron erosions ---> a  cause for chronic blood loss.  + H. pylori gastritis 5 /2015,(-) breath test 11-2014  12/2019: Subdural hematoma, large, s/p SEPS  PLAN Here for CPX Hyperglycemia: Labs [id number] well controlled recommend continue amlodipine, carvedilol, Hyzaar.  Labs, recommend to monitor home Hyperlipidemia: On Lipitor, labs. Prostate cancer: See comments under physical exam Subdural hematoma 12/2019, admitted to [facility], had a SEPS, last follow-up with neurosurgery 04/2020, at the time they recommended CT and if stable follow-up as needed.  CT 04/25/2020 showed decreasing size of subdural hematoma. EtOH?  Recommend moderation RTC 6 months

## 2020-12-13 ENCOUNTER — Other Ambulatory Visit (HOSPITAL_BASED_OUTPATIENT_CLINIC_OR_DEPARTMENT_OTHER): Payer: Self-pay

## 2020-12-13 MED ORDER — PFIZER COVID-19 VAC BIVALENT 30 MCG/0.3ML IM SUSP
INTRAMUSCULAR | 0 refills | Status: DC
  Filled 2020-12-13: qty 0.3, 1d supply, fill #0

## 2020-12-15 ENCOUNTER — Other Ambulatory Visit (HOSPITAL_BASED_OUTPATIENT_CLINIC_OR_DEPARTMENT_OTHER): Payer: Self-pay

## 2020-12-22 ENCOUNTER — Other Ambulatory Visit: Payer: Self-pay

## 2020-12-22 ENCOUNTER — Encounter: Payer: Self-pay | Admitting: *Deleted

## 2020-12-22 ENCOUNTER — Inpatient Hospital Stay: Payer: Medicare PPO | Attending: Urology | Admitting: *Deleted

## 2020-12-22 DIAGNOSIS — C7951 Secondary malignant neoplasm of bone: Secondary | ICD-10-CM

## 2020-12-22 DIAGNOSIS — C61 Malignant neoplasm of prostate: Secondary | ICD-10-CM

## 2020-12-22 NOTE — Progress Notes (Signed)
  2 Identifiers used for this visit for verification purposes only. SCP reviewed and completed. SDOH assessed and completed. Visit was not-in person, no vital signs available. Since radiation pt says he feels better. Denies any pain with urination or anywhere in the body. Denies fatigue. Pt denies urgency and hesitancy with urination. Urine stream is slower , he says.Pt is sleeping well, he only gets up once to bathroom at night. He says he feels like he is emptying bladder completely with urination. He has experienced some hotflashes since the Eligard injections but is coping. Next Eligard injection due December.  He will see urologist this month to have PSA level drawn.Pt is up to date on flu and Covid booster vaccine. Pt does have a 20 pack smoking hx.a CT- Lungs low dose screening was ordered. Last colonoscopy was in 2019.

## 2020-12-31 DIAGNOSIS — C7951 Secondary malignant neoplasm of bone: Secondary | ICD-10-CM | POA: Diagnosis not present

## 2020-12-31 DIAGNOSIS — C61 Malignant neoplasm of prostate: Secondary | ICD-10-CM | POA: Diagnosis not present

## 2021-01-13 ENCOUNTER — Other Ambulatory Visit: Payer: Self-pay

## 2021-01-13 ENCOUNTER — Ambulatory Visit (HOSPITAL_COMMUNITY)
Admission: RE | Admit: 2021-01-13 | Discharge: 2021-01-13 | Disposition: A | Discharge status: Home / Self Care | Payer: Medicare PPO | Source: Ambulatory Visit | Attending: Adult Health | Admitting: Adult Health

## 2021-01-13 DIAGNOSIS — J439 Emphysema, unspecified: Secondary | ICD-10-CM | POA: Insufficient documentation

## 2021-01-13 DIAGNOSIS — Z87891 Personal history of nicotine dependence: Secondary | ICD-10-CM | POA: Diagnosis not present

## 2021-01-13 DIAGNOSIS — C61 Malignant neoplasm of prostate: Secondary | ICD-10-CM | POA: Diagnosis not present

## 2021-01-13 DIAGNOSIS — Z122 Encounter for screening for malignant neoplasm of respiratory organs: Secondary | ICD-10-CM | POA: Diagnosis not present

## 2021-01-13 DIAGNOSIS — C7951 Secondary malignant neoplasm of bone: Secondary | ICD-10-CM | POA: Diagnosis not present

## 2021-01-13 DIAGNOSIS — I700 Atherosclerosis of aorta: Secondary | ICD-10-CM | POA: Diagnosis not present

## 2021-01-13 IMAGING — CT CT CHEST LUNG CANCER SCREENING LOW DOSE W/O CM
2 of 5 series · 15 of 40 positions shown, 18 images · non-contrast
Comparison: PET-CT dated [DATE]

CLINICAL DATA: Former smoker with greater than 20 pack-year history

EXAM:
CT CHEST WITHOUT CONTRAST LOW-DOSE FOR LUNG CANCER SCREENING
TECHNIQUE: Multidetector CT imaging of the chest was performed following the
standard protocol without IV contrast.

[Series 4: lungs · axial · 0.85mm/px · z∈[-88,+188]mm · 12 of 304 slices shown, 15 images]
[im 14/304  mediastinal]
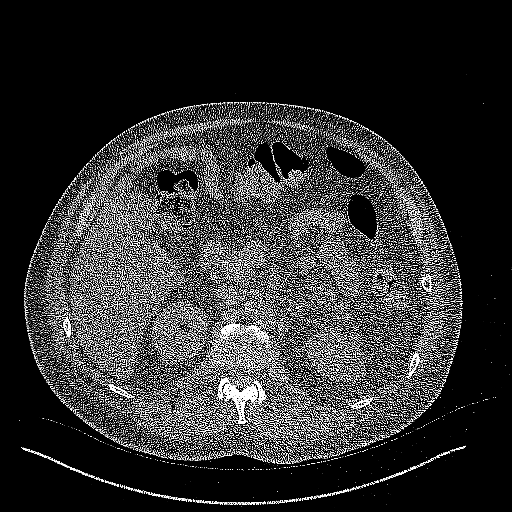
[im 14/304  lung]
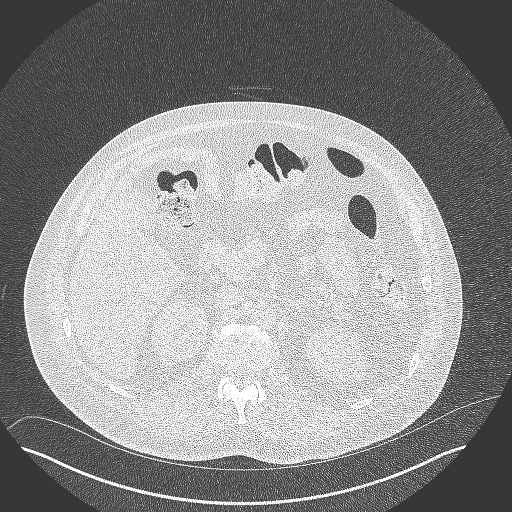
[im 42/304  lung]
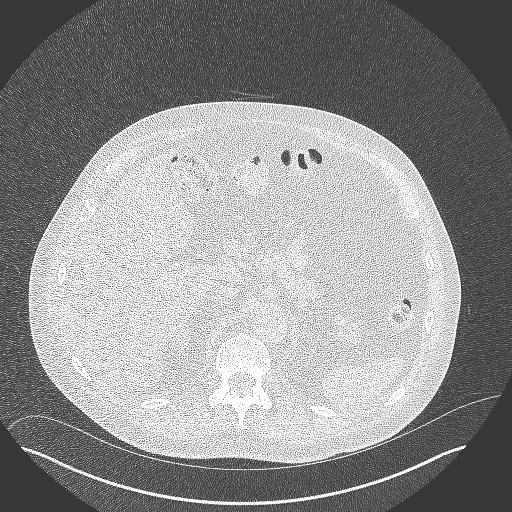
[im 69/304  lung]
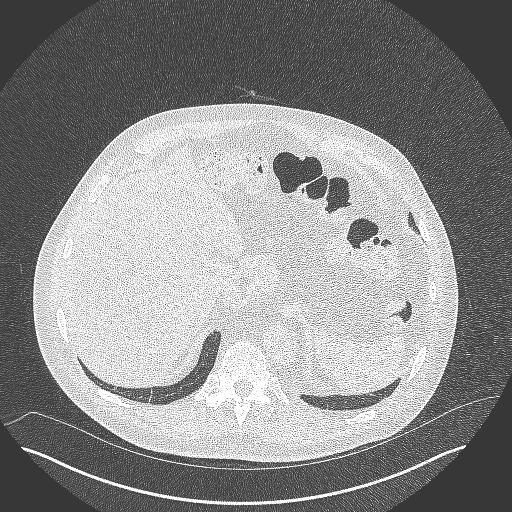
[im 97/304  lung]
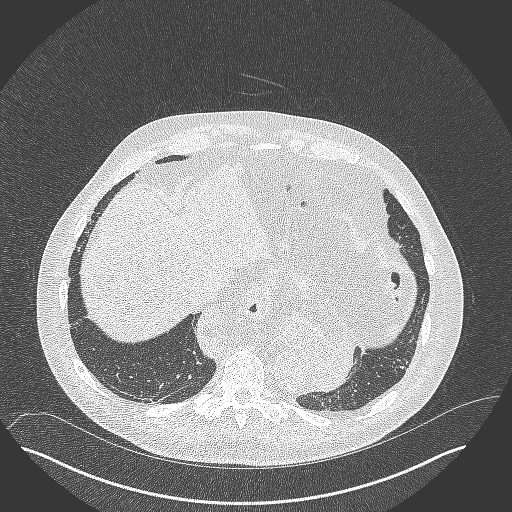
[im 111/304  mediastinal]
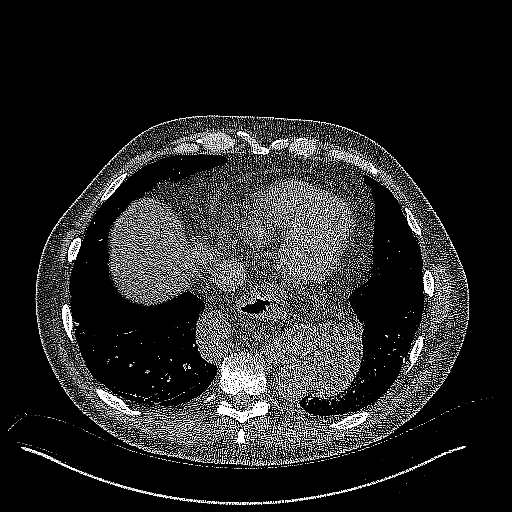
[im 111/304  lung]
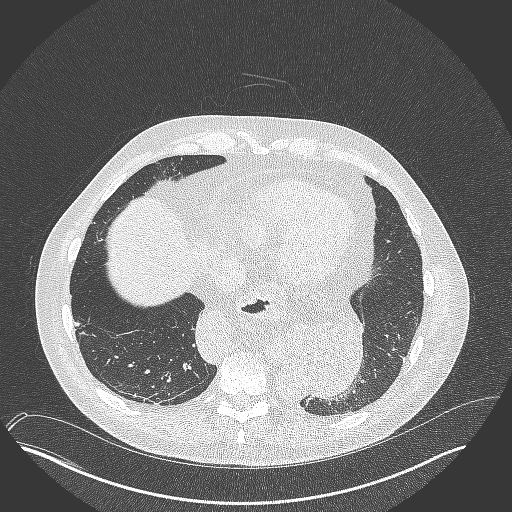
[im 138/304  lung]
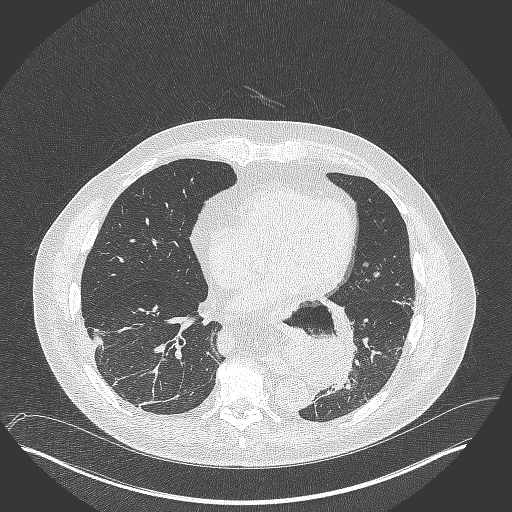
[im 166/304  lung]
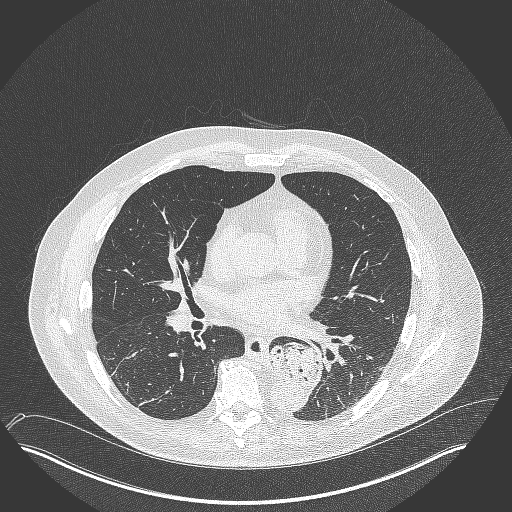
[im 193/304  lung]
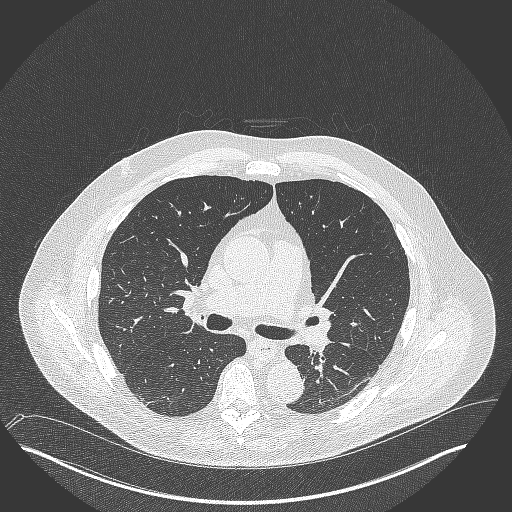
[im 207/304  mediastinal]
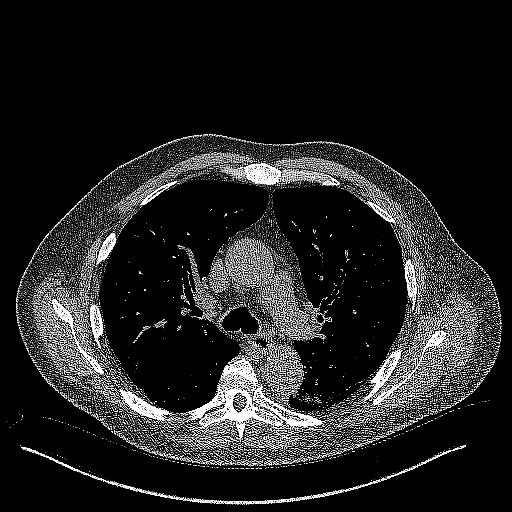
[im 207/304  lung]
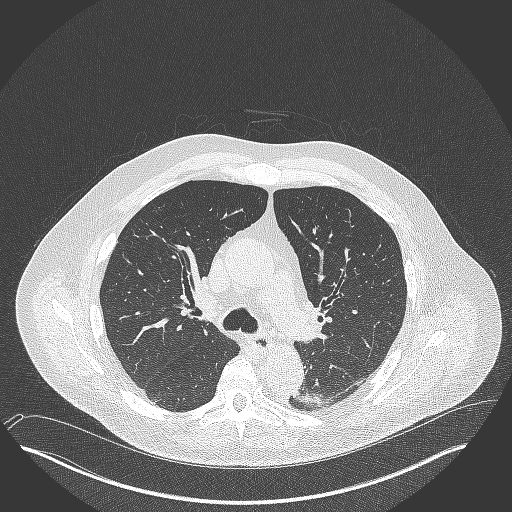
[im 235/304  lung]
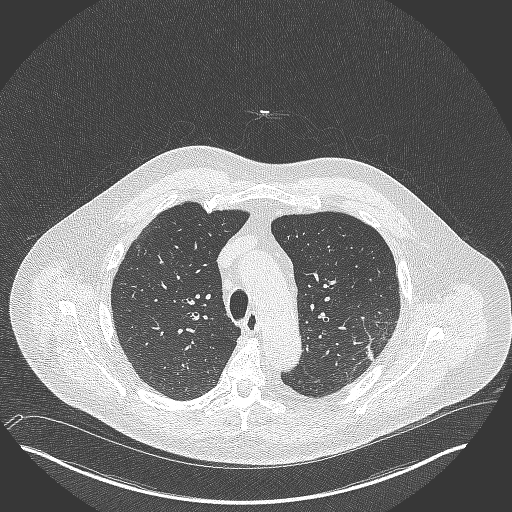
[im 262/304  lung]
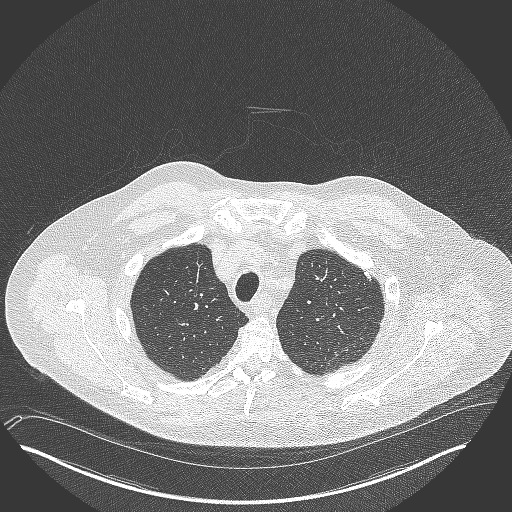
[im 290/304  lung]
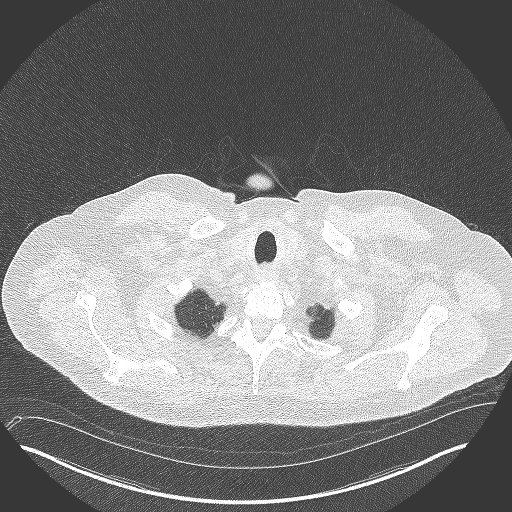

[Series 5: coronal · coronal · 0.73mm/px · 3 of 270 slices shown]
[im 54/270  lung]
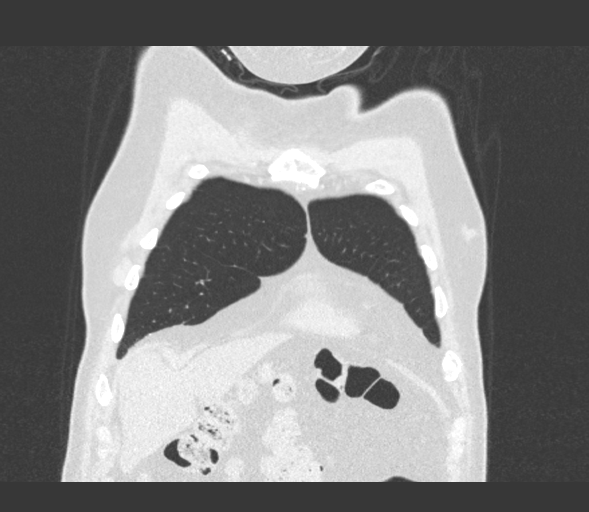
[im 108/270  lung]
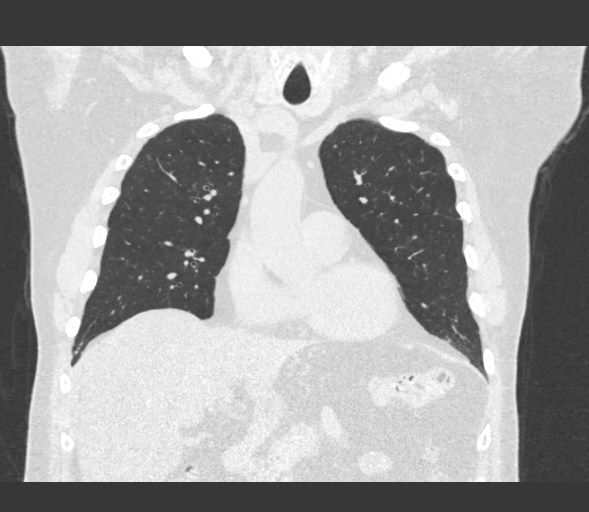
[im 162/270  lung]
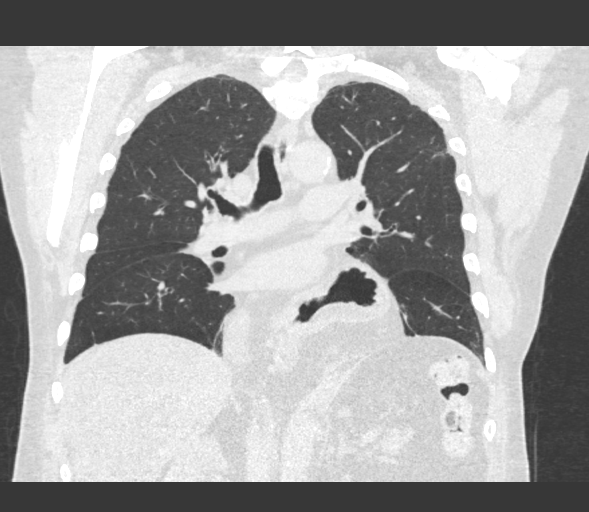

[15 of 40 positions shown; findings below may reference images not displayed]

FINDINGS: Cardiovascular: Normal heart size. No pericardial effusion. No
significant coronary artery calcifications. Mild atherosclerotic
disease of the thoracic aorta.

Mediastinum/Nodes: Large hiatal hernia. No pathologically enlarged
lymph nodes seen in the chest.

Lungs/Pleura: Central airways are patent. Mild paraseptal emphysema.
Focal linear nodular opacity of the right upper lobe is unchanged
when compared with CT and likely due to focal scarring. Peripheral
solid nodular opacity of the left upper lobe with a mean diameter of
21.5 mm and part solid nodular opacity of the left lower lobe
measuring up to 24.7 mm.

Upper Abdomen: Low-attenuation lesion of the right lobe of the liver
measuring up to 7.8 cm, present on prior PET-CT and likely a simple
cyst. Heterogeneous low attenuation of the liver, likely due to
hepatic steatosis. No acute abnormality.

Musculoskeletal: No chest wall mass or suspicious bone lesions
identified.
IMPRESSION: 1. Peripheral solid nodular opacity of the left upper lobe with a
mean diameter of 21.5 mm and part solid nodular opacity of the left
lower lobe measuring up to 17.3 mm which are new compared to prior
PET-CT. Potentially an infectious or inflammatory process, but based
on size technically Lung-RADS 4B, suspicious. Additional imaging
evaluation or consultation with Pulmonology or Thoracic Surgery
recommended. Follow-up chest CT is recommended in 1 month for
further evaluation.
2. Aortic Atherosclerosis ([LY]-[LY]) and Emphysema ([LY]-[LY]).

These results will be called to the ordering clinician or
representative by the Radiologist Assistant, and communication
documented in the PACS or [REDACTED].

## 2021-01-15 ENCOUNTER — Other Ambulatory Visit: Payer: Self-pay | Admitting: Adult Health

## 2021-01-15 DIAGNOSIS — C61 Malignant neoplasm of prostate: Secondary | ICD-10-CM

## 2021-01-15 DIAGNOSIS — R911 Solitary pulmonary nodule: Secondary | ICD-10-CM

## 2021-01-15 NOTE — Progress Notes (Signed)
Joseph Golden is a 76 year old male who was seen in survivorship care for malignant prostate cancer metastatic to the bone.  He underwent a PET scan in May 2022 which showed likely metastatic disease to the rib.  He underwent lung cancer screening on January 3 which showed a 2 cm lung nodule and a 1.5 cm lung nodule.    I called Joseph Golden and reviewed these results with him.  I let him know that we need to get him in with a lung specialist to see if they are able to help [place] get a tissue sample of this nodule to determine what is going on.  I let Joseph Golden know that they may also recommend additional imaging to further evaluate.  I placed an urgent pulmonary referral.  And personally sent a message to Dr. Lamonte Sakai and Dr. Valeta Harms.  Wilber Bihari, NP 01/15/21 9:46 AM Medical Oncology and Hematology [facility] [place], [place] [address] Tel. [phone]    Fax. [phone]

## 2021-01-21 ENCOUNTER — Telehealth: Payer: Self-pay

## 2021-01-21 NOTE — Telephone Encounter (Signed)
Collene Gobble, MD  Dierdre Highman, RN Cc: Garner Nash, DO Need to get this pt an OV asap with either RB or BI. Thanks        Previous Messages   ----- Message -----  From: Gardenia Phlegm, NP  Sent: 01/15/2021   9:51 AM EST  To: Collene Gobble, MD, Garner Nash, DO  Subject: New lung nodule evaluation                     Hey there,   I want to make sure that you received a message about this patient that I need to be seen and placed a referral to be seen.  He has prostate cancer metastatic to the bone.  PET scan in May did not show any lung involvement.  He underwent lung cancer screening a couple of days ago which showed 2 lung nodules the largest approximately 2.1 cm.  If you are able to review and get the patient in promptly I would greatly appreciate it.  If I need to send the patient to CT surgery based on the location please let me know.   [place] is aware of everything and that I was placing a referral today.   Wilber Bihari, NP 01/15/21 9:50 AM  Medical Oncology and Hematology  [facility]  [place], [place] [address]  Tel. [phone]    Fax. [phone]   Pt is scheulded on 02/04/21 with Dr. Lamonte Sakai. Nothing further needed at this time.

## 2021-02-03 DIAGNOSIS — C61 Malignant neoplasm of prostate: Secondary | ICD-10-CM | POA: Diagnosis not present

## 2021-02-03 DIAGNOSIS — Z5111 Encounter for antineoplastic chemotherapy: Secondary | ICD-10-CM | POA: Diagnosis not present

## 2021-02-04 ENCOUNTER — Ambulatory Visit: Payer: Medicare PPO | Admitting: Emergency Medicine

## 2021-02-04 ENCOUNTER — Other Ambulatory Visit: Payer: Self-pay

## 2021-02-04 ENCOUNTER — Encounter: Payer: Self-pay | Admitting: Emergency Medicine

## 2021-02-04 VITALS — BP 124/76 | HR 75 | Temp 98.0°F | Ht 70.5 in | Wt 209.2 lb

## 2021-02-04 DIAGNOSIS — R9389 Abnormal findings on diagnostic imaging of other specified body structures: Secondary | ICD-10-CM

## 2021-02-04 DIAGNOSIS — R911 Solitary pulmonary nodule: Secondary | ICD-10-CM | POA: Diagnosis not present

## 2021-02-04 NOTE — Assessment & Plan Note (Signed)
Patient underwent a lung cancer screening CT although he only smoked a pipe, never cigarettes and states that he did not inhale.  He did smoke a pipe for many years.  I reviewed his CT chest and the areas of mixed density focal abnormality are adjacent to his hypermetabolic rib lesions.  I suspect that this is radiation change associated with his XRT.  Plan is to repeat a short interval CT now.  If stable we will decide whether to follow or pursue navigational bronchoscopy depending on suspicion regarding metastatic prostate cancer.  I will follow-up with him after the scan is done to review.

## 2021-02-04 NOTE — Progress Notes (Signed)
Subjective:    Patient ID: Joseph Golden, male    DOB: [birth year], 76 y.o.   MRN: [id number]  HPI 76 year old gentleman with history tobacco use (pipe, no inhalation).  He has a history of GERD with hiatal hernia, hypertension and prostate cancer that was treated by prostatectomy and XRT, then XRT to ribs in late 2022.  He had a PET scan in 05/2020 to did not show any lung involvement.  Subsequent lung cancer screening CT chest performed 01/13/2021 showed evidence for pulmonary nodule disease as described below.  PET scan 06/10/2020 reviewed by me shows 3 discrete radiotracer areas of activity in the ribs: Lateral left third rib, posterior medial left fifth rib, anterior right second rib.  There is no evidence of local recurrence in the prostatectomy bed or any evidence of other metastatic disease  LDCT 01/13/2021 reviewed by me, showed some mild paraseptal emphysematous change, a right upper lobe focal linear opacity likely scar.  A left upper lobe peripheral 2.1 cm nodule, part solid 2.4 cm left lower lobe pulmonary nodule.   Review of Systems As per Hpi  Past Medical History:  Diagnosis Date   Allergy    SEASONAL   Diverticulosis 81/82/9937   Helicobacter pylori gastritis 04/23/2013   History of hiatal hernia    HTN (hypertension)    Iron deficiency anemia secondary to blood loss (chronic) - Hiatal hernia with Lysbeth Galas erosions 03/01/2013   Other and unspecified hyperlipidemia 10/31/2012   Personal history of colonic adenoma 12/30/2011   12/2011 - 12 mm adenoma with high-grade dysplasia   Prostate cancer ([place]) 02/2017     Family History  Problem Relation Age of Onset   Hypertension Mother    Breast cancer Mother    Brain cancer Father 73   Stomach cancer Other    Hypertension Sister    Hypertension Brother    CVA Maternal Uncle    Colon cancer Neg Hx    Esophageal cancer Neg Hx    Rectal cancer Neg Hx    Prostate cancer Neg Hx    Diabetes Neg Hx    CAD Neg Hx      Social  History   Socioeconomic History   Marital status: Married    Spouse name: Not on file   Number of children: 5   Years of education: Not on file   Highest education level: Not on file  Occupational History   Occupation: retired   Tobacco Use   Smoking status: Former    Types: Pipe    Quit date: 2007    Years since quitting: 16.0   Smokeless tobacco: Never   Tobacco comments:    quit ~ 2005, smoked pipe   Vaping Use   Vaping Use: Never used  Substance and Sexual Activity   Alcohol use: Yes    Alcohol/week: 2.0 standard drinks    Types: 2 Cans of beer per week    Comment: social   Drug use: No   Sexual activity: Not Currently  Other Topics Concern   Not on file  Social History Narrative   Married for 50  years. Lives in [place]. One son and four daughters.   Household: wife, 2 daughters and 2 g-son   Social Determinants of Radio broadcast assistant Strain: Low Risk    Difficulty of Paying Living Expenses: Not very hard  Food Insecurity: No Food Insecurity   Worried About Charity fundraiser in the Last Year: Never true   [employer]  of Food in the Last Year: Never true  Transportation Needs: No Transportation Needs   Lack of Transportation (Medical): No   Lack of Transportation (Non-Medical): No  Physical Activity: Sufficiently Active   Days of Exercise per Week: 6 days   Minutes of Exercise per Session: 60 min  Stress: No Stress Concern Present   Feeling of Stress : Not at all  Social Connections: Moderately Integrated   Frequency of Communication with Friends and Family: More than three times a week   Frequency of Social Gatherings with Friends and Family: Three times a week   Attends Religious Services: 1 to 4 times per year   Active Member of Clubs or Organizations: No   Attends Archivist Meetings: Never   Marital Status: Married  Human resources officer Violence: Not At Risk   Fear of Current or Ex-Partner: No   Emotionally Abused: No   Physically  Abused: No   Sexually Abused: No  Worked as a Geneticist, 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 90 tablet 3   carvedilol (COREG) 6.25 MG tablet Take 1 tablet (6.25 mg total) by mouth 2 (two) times daily with a meal. 180 tablet 1   losartan-hydrochlorothiazide (HYZAAR) 50-12.5 MG tablet TAKE 1 TABLET BY MOUTH EVERY DAY 90 tablet 0   COVID-19 mRNA bivalent vaccine, Pfizer, (PFIZER COVID-19 VAC BIVALENT) injection Inject into the muscle. 0.3 mL 0   No facility-administered medications prior to visit.         Objective:   Physical Exam Vitals:   02/04/21 1030  BP: 124/76  Pulse: 75  Temp: 98 F (36.7 C)  TempSrc: Oral  SpO2: 98%  Weight: 209 lb 3.2 oz (94.9 kg)  Height: 5' 10.5" (1.791 m)   Gen: Pleasant, well-nourished, in no distress,  normal affect  ENT: No lesions,  mouth clear,  oropharynx clear, no postnasal drip  Neck: No JVD, no stridor  Lungs: No use of accessory muscles, no crackles or wheezing on normal respiration, no wheeze on forced expiration  Cardiovascular: RRR, heart sounds normal, no murmur or gallops, no peripheral edema  Musculoskeletal: No deformities, no cyanosis or clubbing  Neuro: alert, awake, non focal  Skin: Warm, no lesions or rash      Assessment & Plan:   Abnormal CT of the chest Patient underwent a lung cancer screening CT although he only smoked a pipe, never cigarettes and states that he did not inhale.  He did smoke a pipe for many years.  I reviewed his CT chest and the areas of mixed density focal abnormality are adjacent to his hypermetabolic rib lesions.  I suspect that this is radiation change associated with his XRT.  Plan is to repeat  a short interval CT now.  If stable we will decide whether to follow or pursue navigational bronchoscopy depending on suspicion regarding metastatic prostate cancer.  I will follow-up with him after the scan is done to review.  Baltazar Apo, MD, PhD 02/04/2021, 11:12 AM [facility] [phone] or if no answer before 7:00PM call [phone] For any issues after 7:00PM please call eLink [phone]

## 2021-02-04 NOTE — Patient Instructions (Signed)
We will repeat your CT scan of the chest at the beginning of February.  Depending on the results we will decide whether to continue to follow the areas of nodularity in the chest versus pursue a bronchoscopy with biopsies. Follow Dr. Lamonte Sakai next available after your CT to review.

## 2021-02-04 NOTE — Addendum Note (Signed)
Addended by: Gavin Potters R on: 02/04/2021 11:15 AM   Modules accepted: Orders

## 2021-02-16 ENCOUNTER — Other Ambulatory Visit: Payer: Self-pay

## 2021-02-16 ENCOUNTER — Ambulatory Visit (INDEPENDENT_AMBULATORY_CARE_PROVIDER_SITE_OTHER)
Admission: RE | Admit: 2021-02-16 | Discharge: 2021-02-16 | Disposition: A | Discharge status: Home / Self Care | Payer: Medicare PPO | Source: Ambulatory Visit | Attending: Emergency Medicine | Admitting: Emergency Medicine

## 2021-02-16 DIAGNOSIS — J439 Emphysema, unspecified: Secondary | ICD-10-CM | POA: Diagnosis not present

## 2021-02-16 DIAGNOSIS — R911 Solitary pulmonary nodule: Secondary | ICD-10-CM | POA: Diagnosis not present

## 2021-02-16 DIAGNOSIS — I700 Atherosclerosis of aorta: Secondary | ICD-10-CM | POA: Diagnosis not present

## 2021-02-16 DIAGNOSIS — J9811 Atelectasis: Secondary | ICD-10-CM | POA: Diagnosis not present

## 2021-02-20 ENCOUNTER — Other Ambulatory Visit: Payer: Self-pay | Admitting: Internal Medicine

## 2021-03-05 ENCOUNTER — Encounter: Payer: Self-pay | Admitting: Emergency Medicine

## 2021-03-05 ENCOUNTER — Other Ambulatory Visit: Payer: Self-pay

## 2021-03-05 ENCOUNTER — Ambulatory Visit: Payer: Medicare PPO | Admitting: Emergency Medicine

## 2021-03-05 VITALS — BP 118/68 | HR 66 | Temp 98.1°F | Ht 70.5 in | Wt 206.2 lb

## 2021-03-05 DIAGNOSIS — R911 Solitary pulmonary nodule: Secondary | ICD-10-CM

## 2021-03-05 DIAGNOSIS — R9389 Abnormal findings on diagnostic imaging of other specified body structures: Secondary | ICD-10-CM

## 2021-03-05 NOTE — Patient Instructions (Signed)
We reviewed your CT scan of the chest today. We will plan to repeat your CT scan of the chest in August 2023 to follow for stability. Follow Dr. Lamonte Sakai in August after your CT so we can review the results together.

## 2021-03-05 NOTE — Addendum Note (Signed)
Addended by: Dierdre Highman on: 03/05/2021 09:36 AM   Modules accepted: Orders

## 2021-03-05 NOTE — Assessment & Plan Note (Signed)
He is left upper lobe peripheral lesion is a little less dense, probably a little smaller.  I still suspect that is due to radiation change.  The left lower lobe irregular groundglass lesion is unchanged, question scar.  He also has stable right lower lobe scarring.  Nothing pushing [place] for bronchoscopy at this time.  We will continue to surveillance.  Repeat his CT scan of the chest in 6 months and then follow-up to review.

## 2021-03-05 NOTE — Progress Notes (Signed)
Subjective:    Patient ID: Joseph Golden, male    DOB: [birth year], 76 y.o.   MRN: [id number]  HPI 76 year old gentleman with history tobacco use (pipe, no inhalation).  He has a history of GERD with hiatal hernia, hypertension and prostate cancer that was treated by prostatectomy and XRT, then XRT to ribs in late 2022.  He had a PET scan in 05/2020 to did not show any lung involvement.  Subsequent lung cancer screening CT chest performed 01/13/2021 showed evidence for pulmonary nodule disease as described below.  PET scan 06/10/2020 reviewed by me shows 3 discrete radiotracer areas of activity in the ribs: Lateral left third rib, posterior medial left fifth rib, anterior right second rib.  There is no evidence of local recurrence in the prostatectomy bed or any evidence of other metastatic disease  LDCT 01/13/2021 reviewed by me, showed some mild paraseptal emphysematous change, a right upper lobe focal linear opacity likely scar.  A left upper lobe peripheral 2.1 cm nodule, part solid 2.4 cm left lower lobe pulmonary nodule.  ROV 03/05/21 --follow-up visit for Mr. 56, 76 year old gentleman with history of prostate cancer treated with prostatectomy, XRT, XRT to the ribs 2022.  Also with GERD, hypertension.  He had a low-dose CT 01/13/2021 that showed a right upper lobe focal linear opacity, left upper lobe peripheral 2.1 cm nodule, part solid 2.4 cm left lower lobe nodule.  The location made this suspicious for radiation change following his rib treatment.  CT chest 02/16/2021 reviewed by me, shows a mild reduction in size and solidity of the left upper lobe peripheral lesion.  There is a large hiatal hernia.  He has small sclerotic lesions in the right second and left third ribs.   Review of Systems As per Hpi  Past Medical History:  Diagnosis Date   Allergy    SEASONAL   Diverticulosis 81/15/7262   Helicobacter pylori gastritis 04/23/2013   History of hiatal hernia    HTN (hypertension)    Iron  deficiency anemia secondary to blood loss (chronic) - Hiatal hernia with Lysbeth Galas erosions 03/01/2013   Other and unspecified hyperlipidemia 10/31/2012   Personal history of colonic adenoma 12/30/2011   12/2011 - 12 mm adenoma with high-grade dysplasia   Prostate cancer ([place]) 02/2017     Family History  Problem Relation Age of Onset   Hypertension Mother    Breast cancer Mother    Brain cancer Father 82   Stomach cancer Other    Hypertension Sister    Hypertension Brother    CVA Maternal Uncle    Colon cancer Neg Hx    Esophageal cancer Neg Hx    Rectal cancer Neg Hx    Prostate cancer Neg Hx    Diabetes Neg Hx    CAD Neg Hx      Social History   Socioeconomic History   Marital status: Married    Spouse name: Not on file   Number of children: 5   Years of education: Not on file   Highest education level: Not on file  Occupational History   Occupation: retired   Tobacco Use   Smoking status: Former    Types: Pipe    Quit date: 2007    Years since quitting: 16.1   Smokeless tobacco: Never   Tobacco comments:    quit ~ 2005, smoked pipe   Vaping Use   Vaping Use: Never used  Substance and Sexual Activity   Alcohol use: Yes  Alcohol/week: 2.0 standard drinks    Types: 2 Cans of beer per week    Comment: social   Drug use: No   Sexual activity: Not Currently  Other Topics Concern   Not on file  Social History Narrative   Married for 50  years. Lives in [place]. One son and four daughters.   Household: wife, 2 daughters and 2 g-son   Social Determinants of Radio broadcast assistant Strain: Low Risk    Difficulty of Paying Living Expenses: Not very hard  Food Insecurity: No Food Insecurity   Worried About Charity fundraiser in the Last Year: Never true   Arboriculturist in the Last Year: Never true  Transportation Needs: No Transportation Needs   Lack of Transportation (Medical): No   Lack of Transportation (Non-Medical): No  Physical Activity:  Sufficiently Active   Days of Exercise per Week: 6 days   Minutes of Exercise per Session: 60 min  Stress: No Stress Concern Present   Feeling of Stress : Not at all  Social Connections: Moderately Integrated   Frequency of Communication with Friends and Family: More than three times a week   Frequency of Social Gatherings with Friends and Family: Three times a week   Attends Religious Services: 1 to 4 times per year   Active Member of Clubs or Organizations: No   Attends Archivist Meetings: Never   Marital Status: Married  Human resources officer Violence: Not At Risk   Fear of Current or Ex-Partner: No   Emotionally Abused: No   Physically Abused: No   Sexually Abused: No  Worked as a Geneticist, 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 90 tablet 3   carvedilol (COREG) 6.25 MG tablet Take 1 tablet (6.25 mg total) by mouth 2 (two) times daily with a meal. 180 tablet 1   COVID-19 mRNA bivalent vaccine, Pfizer, (PFIZER COVID-19 VAC BIVALENT) injection Inject into the muscle. 0.3 mL 0   losartan-hydrochlorothiazide (HYZAAR) 50-12.5 MG tablet TAKE 1 TABLET BY MOUTH EVERY DAY 90 tablet 1   No facility-administered medications prior to visit.         Objective:   Physical Exam Vitals:   03/05/21 0911  BP: 118/68  Pulse: 66  Temp: 98.1 F (36.7 C)  TempSrc: Oral  SpO2: 98%  Weight: 206 lb 3.2 oz (93.5 kg)  Height: 5' 10.5" (1.791 m)   Gen: Pleasant, well-nourished, in no distress,  normal affect  ENT: No lesions,  mouth clear,  oropharynx clear, no postnasal drip  Neck: No JVD, no stridor  Lungs: No use of accessory muscles, no crackles or wheezing on normal respiration, no  wheeze on forced expiration  Cardiovascular: RRR, heart sounds normal, no murmur or gallops, no peripheral edema  Musculoskeletal: No deformities, no cyanosis or clubbing  Neuro: alert, awake, non focal  Skin: Warm, no lesions or rash      Assessment & Plan:   Abnormal CT of the chest He is left upper lobe peripheral lesion is a little less dense, probably a little smaller.  I still suspect that is due to radiation change.  The left lower lobe irregular groundglass  lesion is unchanged, question scar.  He also has stable right lower lobe scarring.  Nothing pushing [place] for bronchoscopy at this time.  We will continue to surveillance.  Repeat his CT scan of the chest in 6 months and then follow-up to review.  Baltazar Apo, MD, PhD 03/05/2021, 9:32 AM [facility] [phone] or if no answer before 7:00PM call [phone] For any issues after 7:00PM please call eLink [phone]

## 2021-04-09 DIAGNOSIS — C61 Malignant neoplasm of prostate: Secondary | ICD-10-CM | POA: Diagnosis not present

## 2021-04-09 DIAGNOSIS — C7951 Secondary malignant neoplasm of bone: Secondary | ICD-10-CM | POA: Diagnosis not present

## 2021-04-16 ENCOUNTER — Other Ambulatory Visit: Payer: Self-pay | Admitting: Internal Medicine

## 2021-05-09 ENCOUNTER — Other Ambulatory Visit: Payer: Self-pay | Admitting: Internal Medicine

## 2021-06-01 ENCOUNTER — Ambulatory Visit: Payer: Medicare PPO | Admitting: Internal Medicine

## 2021-06-03 ENCOUNTER — Ambulatory Visit: Payer: Medicare PPO | Admitting: Internal Medicine

## 2021-06-09 ENCOUNTER — Encounter: Payer: Self-pay | Admitting: Internal Medicine

## 2021-06-09 ENCOUNTER — Ambulatory Visit: Payer: Medicare PPO | Admitting: Internal Medicine

## 2021-06-09 ENCOUNTER — Other Ambulatory Visit (HOSPITAL_BASED_OUTPATIENT_CLINIC_OR_DEPARTMENT_OTHER): Payer: Self-pay

## 2021-06-09 VITALS — BP 126/84 | HR 66 | Temp 97.8°F | Resp 16 | Ht 70.5 in | Wt 212.2 lb

## 2021-06-09 DIAGNOSIS — E785 Hyperlipidemia, unspecified: Secondary | ICD-10-CM

## 2021-06-09 DIAGNOSIS — R739 Hyperglycemia, unspecified: Secondary | ICD-10-CM

## 2021-06-09 DIAGNOSIS — D649 Anemia, unspecified: Secondary | ICD-10-CM | POA: Diagnosis not present

## 2021-06-09 DIAGNOSIS — R911 Solitary pulmonary nodule: Secondary | ICD-10-CM

## 2021-06-09 DIAGNOSIS — I10 Essential (primary) hypertension: Secondary | ICD-10-CM

## 2021-06-09 LAB — LIPID PANEL
Cholesterol: 189 mg/dL (ref 0–200)
HDL: 64.9 mg/dL (ref >39.00)
LDL Cholesterol: 111 mg/dL — ABNORMAL HIGH (ref 0–99)
NonHDL: 124.54
Total CHOL/HDL Ratio: 3
Triglycerides: 69 mg/dL (ref 0.0–149.0)
VLDL: 13.8 mg/dL (ref 0.0–40.0)

## 2021-06-09 LAB — CBC WITH DIFFERENTIAL/PLATELET
Basophils Absolute: 0 10*3/uL (ref 0.0–0.1)
Basophils Relative: 0.3 % (ref 0.0–3.0)
Eosinophils Absolute: 0.3 10*3/uL (ref 0.0–0.7)
Eosinophils Relative: 5.8 % — ABNORMAL HIGH (ref 0.0–5.0)
HCT: 36.5 % — ABNORMAL LOW (ref 39.0–52.0)
Hemoglobin: 12.2 g/dL — ABNORMAL LOW (ref 13.0–17.0)
Lymphocytes Relative: 17.9 % (ref 12.0–46.0)
Lymphs Abs: 0.8 10*3/uL (ref 0.7–4.0)
MCHC: 33.4 g/dL (ref 30.0–36.0)
MCV: 89 fl (ref 78.0–100.0)
Monocytes Absolute: 0.5 10*3/uL (ref 0.1–1.0)
Monocytes Relative: 11.2 % (ref 3.0–12.0)
Neutro Abs: 2.9 10*3/uL (ref 1.4–7.7)
Neutrophils Relative %: 64.8 % (ref 43.0–77.0)
Platelets: 213 10*3/uL (ref 150.0–400.0)
RBC: 4.11 Mil/uL — ABNORMAL LOW (ref 4.22–5.81)
RDW: 16.4 % — ABNORMAL HIGH (ref 11.5–15.5)
WBC: 4.5 10*3/uL (ref 4.0–10.5)

## 2021-06-09 LAB — COMPREHENSIVE METABOLIC PANEL
ALT: 20 U/L (ref 0–53)
AST: 18 U/L (ref 0–37)
Albumin: 4.2 g/dL (ref 3.5–5.2)
Alkaline Phosphatase: 83 U/L (ref 39–117)
BUN: 15 mg/dL (ref 6–23)
CO2: 28 mEq/L (ref 19–32)
Calcium: 9.9 mg/dL (ref 8.4–10.5)
Chloride: 104 mEq/L (ref 96–112)
Creatinine, Ser: 0.94 mg/dL (ref 0.40–1.50)
GFR: 80.21 mL/min (ref >60.00)
Glucose, Bld: 110 mg/dL — ABNORMAL HIGH (ref 70–99)
Potassium: 4.3 mEq/L (ref 3.5–5.1)
Sodium: 140 mEq/L (ref 135–145)
Total Bilirubin: 0.6 mg/dL (ref 0.2–1.2)
Total Protein: 7 g/dL (ref 6.0–8.3)

## 2021-06-09 LAB — IRON: Iron: 72 ug/dL (ref 42–165)

## 2021-06-09 LAB — HEMOGLOBIN A1C: Hgb A1c MFr Bld: 6.5 % (ref 4.6–6.5)

## 2021-06-09 LAB — FERRITIN: Ferritin: 46.9 ng/mL (ref 22.0–322.0)

## 2021-06-09 MED ORDER — SHINGRIX 50 MCG/0.5ML IM SUSR
0.5000 mL | Freq: Once | INTRAMUSCULAR | 1 refills | Status: AC
  Filled 2021-06-09: qty 1, 1d supply, fill #0
  Filled 2021-10-08: qty 1, 1d supply, fill #1

## 2021-06-09 NOTE — Assessment & Plan Note (Signed)
ROV Hyperglycemia: Wt gain noted, diet and exercise discussed, check A1c. HTN: BP today is very good, reports normal amb  BPs, continue amlodipine, carvedilol, Hyzaar.  Check CMP. Hyperlipidemia: Last LDL not controlled, he was not taking 20 mg Lipitor daily but he is doing so now.  Check FLP. Anemia: Mild, per chart review, GI ROS negative, next colonoscopy 2023.  Check a CBC, iron, ferritin. Pulmonary nodule, LUL.  Found on CT chest 01/13/2021, saw pulmonary.  No indication for bronchoscopy, next CT chest around 08/2021 per pulmonary note Prostate cancer: Current strategy is ADT till 2024. Preventive care: Shingrix prescription printed RTC 6 months CPX

## 2021-06-09 NOTE — Patient Instructions (Addendum)
Recommend to proceed with Shingrix (shingles) vaccine at your pharmacy- see printed prescription.   Check the  blood pressure regularly BP GOAL is between 110/65 and  135/85. If it is consistently higher or lower, let me know   GO TO THE LAB : Get the blood work     [place], [place] back for a physical exam by November 2023

## 2021-06-09 NOTE — Progress Notes (Signed)
Subjective:    Patient ID: Joseph Golden, male    DOB: 11/20/1945, 76 y.o.   MRN: [id number]  DOS:  06/09/2021 Type of visit - description: f/u  Since the last office visit, he was found to have a pulmonary nodule, saw pulmonary.  Note reviewed.  Denies chest pain or difficulty breathing. No edema.  Prostate cancer: Saw urology 03-2021, note reviewed.  Mild anemia noted on exam, he denies nausea vomiting.  No diarrhea, no blood in the stools.  Wt Readings from Last 3 Encounters:  06/09/21 212 lb 4 oz (96.3 kg)  03/05/21 206 lb 3.2 oz (93.5 kg)  02/04/21 209 lb 3.2 oz (94.9 kg)    Review of Systems See above   Past Medical History:  Diagnosis Date   Allergy    SEASONAL   Diverticulosis 94/85/4627   Helicobacter pylori gastritis 04/23/2013   History of hiatal hernia    HTN (hypertension)    Iron deficiency anemia secondary to blood loss (chronic) - Hiatal hernia with Lysbeth Galas erosions 03/01/2013   Other and unspecified hyperlipidemia 10/31/2012   Personal history of colonic adenoma 12/30/2011   12/2011 - 12 mm adenoma with high-grade dysplasia   Prostate cancer ([place]) 02/2017    Past Surgical History:  Procedure Laterality Date   COLONOSCOPY  2013   LYMPHADENECTOMY Bilateral 06/08/2017   Procedure: LYMPHADENECTOMY;  Surgeon: Lucas Mallow, MD;  Location: [facility];  Service: Urology;  Laterality: Bilateral;   NO PAST SURGERIES     PROSTATE BIOPSY     ROBOT ASSISTED LAPAROSCOPIC RADICAL PROSTATECTOMY N/A 06/08/2017   Procedure: XI ROBOTIC ASSISTED LAPAROSCOPIC RADICAL PROSTATECTOMY;  Surgeon: Lucas Mallow, MD;  Location: [facility];  Service: Urology;  Laterality: N/A;    Current Outpatient Medications  Medication Instructions   amLODipine (NORVASC) 10 MG tablet TAKE 1 TABLET BY MOUTH EVERY DAY   atorvastatin (LIPITOR) 20 MG tablet TAKE 1 TABLET BY MOUTH EVERYDAY AT BEDTIME   carvedilol (COREG) 6.25 mg, Oral, 2 times daily with meals   losartan-hydrochlorothiazide  (HYZAAR) 50-12.5 MG tablet TAKE 1 TABLET BY MOUTH EVERY DAY   Zoster Vaccine Adjuvanted (SHINGRIX) injection 0.5 mLs, Intramuscular,  Once       Objective:   Physical Exam BP 126/84   Pulse 66   Temp 97.8 F (36.6 C) (Oral)   Resp 16   Ht 5' 10.5" (1.791 m)   Wt 212 lb 4 oz (96.3 kg)   SpO2 96%   BMI 30.02 kg/m  General:   Well developed, NAD, BMI noted. HEENT:  Normocephalic . Face symmetric, atraumatic Lungs:  CTA B Normal respiratory effort, no intercostal retractions, no accessory muscle use. Heart: RRR,  no murmur.  Lower extremities: no pretibial edema bilaterally  Skin: Not pale. Not jaundice Neurologic:  alert & oriented X3.  Speech normal, gait appropriate for age and unassisted Psych--  Cognition and judgment appear intact.  Cooperative with normal attention span and concentration.  Behavior appropriate. No anxious or depressed appearing.      Assessment      Assessment  Hyperglycemia  (a1c 6.1 2019) HTN Hyperlipidemia Prostate ca dx 2019; s/p radical prostatectomy 05/2017, finished  XRT 12/2017 GI: --Colon polyps --Iron deficient anemia, --EGD 4-15 : Gastritis, H. pylori positive: Status post treatment. HH likely causing  [place] erosions ---> a  cause for chronic blood loss.  + H. pylori gastritis 5 /2015,(-) breath test 11-2014  12/2019: Subdural hematoma, large, s/p SEPS  PLAN ROV Hyperglycemia: Wt  gain noted, diet and exercise discussed, check A1c. HTN: BP today is very good, reports normal amb  BPs, continue amlodipine, carvedilol, Hyzaar.  Check CMP. Hyperlipidemia: Last LDL not controlled, he was not taking 20 mg Lipitor daily but he is doing so now.  Check FLP. Anemia: Mild, per chart review, GI ROS negative, next colonoscopy 2023.  Check a CBC, iron, ferritin. Pulmonary nodule, LUL.  Found on CT chest 01/13/2021, saw pulmonary.  No indication for bronchoscopy, next CT chest around 08/2021 per pulmonary note Prostate cancer: Current strategy  is ADT till 2024. Preventive care: Shingrix prescription printed RTC 6 months CPX

## 2021-06-10 ENCOUNTER — Ambulatory Visit (INDEPENDENT_AMBULATORY_CARE_PROVIDER_SITE_OTHER): Payer: Medicare PPO

## 2021-06-10 DIAGNOSIS — Z0000 Encounter for general adult medical examination without abnormal findings: Secondary | ICD-10-CM | POA: Diagnosis not present

## 2021-06-10 NOTE — Patient Instructions (Signed)
Joseph Golden , Thank you for taking time to come for your Medicare Wellness Visit. I appreciate your ongoing commitment to your health goals. Please review the following plan we discussed and let me know if I can assist you in the future.   Screening recommendations/referrals: Colonoscopy: 05/18/17 due 05/19/22 Recommended yearly ophthalmology/optometry visit for glaucoma screening and checkup Recommended yearly dental visit for hygiene and checkup  Vaccinations: Influenza vaccine: up to date Pneumococcal vaccine: up to date Tdap vaccine: up to date Shingles vaccine: Due-May obtain vaccine at our office or your local pharmacy.    Covid-19: completed  Advanced directives: no, packet sent  Conditions/risks identified: see problem list   Next appointment: Follow up in one year for your annual wellness visit.   Preventive Care 34 Years and Older, Male Preventive care refers to lifestyle choices and visits with your health care provider that can promote health and wellness. What does preventive care include? A yearly physical exam. This is also called an annual well check. Dental exams once or twice a year. Routine eye exams. Ask your health care provider how often you should have your eyes checked. Personal lifestyle choices, including: Daily care of your teeth and gums. Regular physical activity. Eating a healthy diet. Avoiding tobacco and drug use. Limiting alcohol use. Practicing safe sex. Taking low doses of aspirin every day. Taking vitamin and mineral supplements as recommended by your health care provider. What happens during an annual well check? The services and screenings done by your health care provider during your annual well check will depend on your age, overall health, lifestyle risk factors, and family history of disease. Counseling  Your health care provider may ask you questions about your: Alcohol use. Tobacco use. Drug use. Emotional well-being. Home and  relationship well-being. Sexual activity. Eating habits. History of falls. Memory and ability to understand (cognition). Work and work Statistician. Screening  You may have the following tests or measurements: Height, weight, and BMI. Blood pressure. Lipid and cholesterol levels. These may be checked every 5 years, or more frequently if you are over 54 years old. Skin check. Lung cancer screening. You may have this screening every year starting at age 22 if you have a 30-pack-year history of smoking and currently smoke or have quit within the past 15 years. Fecal occult blood test (FOBT) of the stool. You may have this test every year starting at age 14. Flexible sigmoidoscopy or colonoscopy. You may have a sigmoidoscopy every 5 years or a colonoscopy every 10 years starting at age 36. Prostate cancer screening. Recommendations will vary depending on your family history and other risks. Hepatitis C blood test. Hepatitis B blood test. Sexually transmitted disease (STD) testing. Diabetes screening. This is done by checking your blood sugar (glucose) after you have not eaten for a while (fasting). You may have this done every 1-3 years. Abdominal aortic aneurysm (AAA) screening. You may need this if you are a current or former smoker. Osteoporosis. You may be screened starting at age 77 if you are at high risk. Talk with your health care provider about your test results, treatment options, and if necessary, the need for more tests. Vaccines  Your health care provider may recommend certain vaccines, such as: Influenza vaccine. This is recommended every year. Tetanus, diphtheria, and acellular pertussis (Tdap, Td) vaccine. You may need a Td booster every 10 years. Zoster vaccine. You may need this after age 32. Pneumococcal 13-valent conjugate (PCV13) vaccine. One dose is recommended after age 66.  Pneumococcal polysaccharide (PPSV23) vaccine. One dose is recommended after age 4. Talk to your  health care provider about which screenings and vaccines you need and how often you need them. This information is not intended to replace advice given to you by your health care provider. Make sure you discuss any questions you have with your health care provider. Document Released: 01/24/2015 Document Revised: 09/17/2015 Document Reviewed: 10/29/2014 Elsevier Interactive Patient Education  2017 [place] Prevention in the Home Falls can cause injuries. They can happen to people of all ages. There are many things you can do to make your home safe and to help prevent falls. What can I do on the outside of my home? Regularly fix the edges of walkways and driveways and fix any cracks. Remove anything that might make you trip as you walk through a door, such as a raised step or threshold. Trim any bushes or trees on the path to your home. Use bright outdoor lighting. Clear any walking paths of anything that might make someone trip, such as rocks or tools. Regularly check to see if handrails are loose or broken. Make sure that both sides of any steps have handrails. Any raised decks and porches should have guardrails on the edges. Have any leaves, snow, or ice cleared regularly. Use sand or salt on walking paths during winter. Clean up any spills in your garage right away. This includes oil or grease spills. What can I do in the bathroom? Use night lights. Install grab bars by the toilet and in the tub and shower. Do not use towel bars as grab bars. Use non-skid mats or decals in the tub or shower. If you need to sit down in the shower, use a plastic, non-slip stool. Keep the floor dry. Clean up any water that spills on the floor as soon as it happens. Remove soap buildup in the tub or shower regularly. Attach bath mats securely with double-sided non-slip rug tape. Do not have throw rugs and other things on the floor that can make you trip. What can I do in the bedroom? Use night  lights. Make sure that you have a light by your bed that is easy to reach. Do not use any sheets or blankets that are too big for your bed. They should not hang down onto the floor. Have a firm chair that has side arms. You can use this for support while you get dressed. Do not have throw rugs and other things on the floor that can make you trip. What can I do in the kitchen? Clean up any spills right away. Avoid walking on wet floors. Keep items that you use a lot in easy-to-reach places. If you need to reach something above you, use a strong step stool that has a grab bar. Keep electrical cords out of the way. Do not use floor polish or wax that makes floors slippery. If you must use wax, use non-skid floor wax. Do not have throw rugs and other things on the floor that can make you trip. What can I do with my stairs? Do not leave any items on the stairs. Make sure that there are handrails on both sides of the stairs and use them. Fix handrails that are broken or loose. Make sure that handrails are as long as the stairways. Check any carpeting to make sure that it is firmly attached to the stairs. Fix any carpet that is loose or worn. Avoid having throw rugs at the  top or bottom of the stairs. If you do have throw rugs, attach them to the floor with carpet tape. Make sure that you have a light switch at the top of the stairs and the bottom of the stairs. If you do not have them, ask someone to add them for you. What else can I do to help prevent falls? Wear shoes that: Do not have high heels. Have rubber bottoms. Are comfortable and fit you well. Are closed at the toe. Do not wear sandals. If you use a stepladder: Make sure that it is fully opened. Do not climb a closed stepladder. Make sure that both sides of the stepladder are locked into place. Ask someone to hold it for you, if possible. Clearly mark and make sure that you can see: Any grab bars or handrails. First and last  steps. Where the edge of each step is. Use tools that help you move around (mobility aids) if they are needed. These include: Canes. Walkers. Scooters. Crutches. Turn on the lights when you go into a dark area. Replace any light bulbs as soon as they burn out. Set up your furniture so you have a clear path. Avoid moving your furniture around. If any of your floors are uneven, fix them. If there are any pets around you, be aware of where they are. Review your medicines with your doctor. Some medicines can make you feel dizzy. This can increase your chance of falling. Ask your doctor what other things that you can do to help prevent falls. This information is not intended to replace advice given to you by your health care provider. Make sure you discuss any questions you have with your health care provider. Document Released: 10/24/2008 Document Revised: 06/05/2015 Document Reviewed: 02/01/2014 Elsevier Interactive Patient Education  2017 [employer].

## 2021-06-10 NOTE — Progress Notes (Addendum)
Subjective:   Joseph Golden is a 75 y.o. male who presents for Medicare Annual/Subsequent preventive examination.  I connected with  Joseph Golden on 06/10/21 by a audio enabled telemedicine application and verified that I am speaking with the correct person using two identifiers.  Patient Location: Home  Provider Location: Office/Clinic  I discussed the limitations of evaluation and management by telemedicine. The patient expressed understanding and agreed to proceed.   Review of Systems     Cardiac Risk Factors include: advanced age (>80mn, >>75women);hypertension;dyslipidemia     Objective:    There were no vitals filed for this visit. There is no height or weight on file to calculate BMI.     06/10/2021   10:23 AM 10/14/2020   12:29 PM 02/02/2018    9:25 AM 08/24/2017    9:59 AM 06/08/2017    6:56 PM 06/08/2017    6:00 AM 05/30/2017    8:22 AM  Advanced Directives  Does Patient Have a Medical Advance Directive? No No No No  No No  Would patient like information on creating a medical advance directive? No - Patient declined No - Patient declined No - Patient declined  No - Patient declined  Yes (MAU/Ambulatory/Procedural Areas - Information given)    Current Medications (verified) Outpatient Encounter Medications as of 06/10/2021  Medication Sig   amLODipine (NORVASC) 10 MG tablet TAKE 1 TABLET BY MOUTH EVERY DAY   atorvastatin (LIPITOR) 20 MG tablet TAKE 1 TABLET BY MOUTH EVERYDAY AT BEDTIME   carvedilol (COREG) 6.25 MG tablet Take 1 tablet (6.25 mg total) by mouth 2 (two) times daily with a meal.   losartan-hydrochlorothiazide (HYZAAR) 50-12.5 MG tablet TAKE 1 TABLET BY MOUTH EVERY DAY   Zoster Vaccine Adjuvanted (SHINGRIX) injection Inject 0.5 mLs into the muscle once for 1 dose.   No facility-administered encounter medications on file as of 06/10/2021.    Allergies (verified) Patient has no known allergies.   History: Past Medical History:  Diagnosis Date    Allergy    SEASONAL   Diverticulosis 165/46/5035  Helicobacter pylori gastritis 04/23/2013   History of hiatal hernia    HTN (hypertension)    Iron deficiency anemia secondary to blood loss (chronic) - Hiatal hernia with CLysbeth Galaserosions 03/01/2013   Other and unspecified hyperlipidemia 10/31/2012   Personal history of colonic adenoma 12/30/2011   12/2011 - 12 mm adenoma with high-grade dysplasia   [place]   Past Surgical History:  Procedure Laterality Date   COLONOSCOPY  2013   LYMPHADENECTOMY Bilateral 06/08/2017   Procedure: LYMPHADENECTOMY;  Surgeon: BLucas Mallow MD;  Location: [facility];  Service: Urology;  Laterality: Bilateral;   NO PAST SURGERIES     PROSTATE BIOPSY     ROBOT ASSISTED LAPAROSCOPIC RADICAL PROSTATECTOMY N/A 06/08/2017   Procedure: XI ROBOTIC ASSISTED LAPAROSCOPIC RADICAL PROSTATECTOMY;  Surgeon: BLucas Mallow MD;  Location: [facility];  Service: Urology;  Laterality: N/A;   Family History  Problem Relation Age of Onset   Hypertension Mother    Breast cancer Mother    Brain cancer Father 686  Stomach cancer Other    Hypertension Sister    Hypertension Brother    CVA Maternal Uncle    Colon cancer Neg Hx    Esophageal cancer Neg Hx    Rectal cancer Neg Hx    Prostate cancer Neg Hx    Diabetes Neg Hx    CAD Neg Hx  Social History   Socioeconomic History   Marital status: Married    Spouse name: Not on file   Number of children: 5   Years of education: Not on file   Highest education level: Not on file  Occupational History   Occupation: retired   Tobacco Use   Smoking status: Former    Types: Pipe    Quit date: 2007    Years since quitting: 16.4   Smokeless tobacco: Never   Tobacco comments:    quit ~ 2005, smoked pipe   Vaping Use   Vaping Use: Never used  Substance and Sexual Activity   Alcohol use: Yes    Alcohol/week: 2.0 standard drinks    Types: 2 Cans of beer per week    Comment: social   Drug use: No    Sexual activity: Not Currently  Other Topics Concern   Not on file  Social History Narrative   Married for 50  years. Lives in [place]. One son and four daughters.   Household: wife, 2 daughters and 2 g-son   Social Determinants of Radio broadcast assistant Strain: Low Risk    Difficulty of Paying Living Expenses: Not very hard  Food Insecurity: No Food Insecurity   Worried About Charity fundraiser in the Last Year: Never true   Arboriculturist in the Last Year: Never true  Transportation Needs: No Transportation Needs   Lack of Transportation (Medical): No   Lack of Transportation (Non-Medical): No  Physical Activity: Sufficiently Active   Days of Exercise per Week: 6 days   Minutes of Exercise per Session: 60 min  Stress: No Stress Concern Present   Feeling of Stress : Not at all  Social Connections: Moderately Integrated   Frequency of Communication with Friends and Family: More than three times a week   Frequency of Social Gatherings with Friends and Family: Three times a week   Attends Religious Services: 1 to 4 times per year   Active Member of Clubs or Organizations: No   Attends Archivist Meetings: Never   Marital Status: Married    Tobacco Counseling Counseling given: Not Answered Tobacco comments: quit ~ 2005, smoked pipe    Clinical Intake:  Pre-visit preparation completed: Yes  Pain : No/denies pain     Nutritional Risks: None Diabetes: No  How often do you need to have someone help you when you read instructions, pamphlets, or other written materials from your doctor or pharmacy?: 1 - Never  Diabetic?No  Interpreter Needed?: No  Information entered by :: [employer] [employer]    06/10/2021   10:25 AM 12/01/2020    8:59 AM  In your present state of health, do you have any difficulty performing the following activities:  Hearing? 0 0  Vision? 0 0  Difficulty concentrating or making decisions? 0 0   Walking or climbing stairs? 0 0  Dressing or bathing? 0 0  Doing errands, shopping? 0 0  Preparing Food and eating ? N   Using the Toilet? N   In the past six months, have you accidently leaked urine? N   Do you have problems with loss of bowel control? N   Managing your Medications? N   Managing your Finances? N   Housekeeping or managing your Housekeeping? N     Patient Care Team: Colon Branch, MD as PCP - General (Internal Medicine) Gatha Mayer, MD as Consulting Physician (Gastroenterology)  Lucas Mallow, MD as Consulting Physician (Urology) Tyler Pita, MD as Consulting Physician (Radiation Oncology) Delice Bison Charlestine Massed, NP as Nurse Practitioner (Hematology and Oncology) Harmon Pier, RN as Registered Nurse  Indicate any recent Medical Services you may have received from other than [facility] providers in the past year (date may be approximate).     Assessment:   This is a routine wellness examination for [facility].  Hearing/Vision screen No results found.  Dietary issues and exercise activities discussed: Current Exercise Habits: Home exercise routine, Type of exercise: walking, Time (Minutes): 30, Frequency (Times/Week): 7, Weekly Exercise (Minutes/Week): 210, Intensity: Mild   Goals Addressed   None    Depression Screen    06/10/2021   10:24 AM 06/09/2021    9:19 AM 12/22/2020    3:31 PM 12/01/2020    8:59 AM 11/29/2019    9:15 AM 11/28/2018    8:44 AM 03/07/2017    8:45 AM  PHQ 2/9 Scores  PHQ - 2 Score 0 0 0 0 0 0 0    Fall Risk    06/10/2021   10:24 AM 06/09/2021    9:19 AM 12/01/2020    8:59 AM 11/29/2019    9:14 AM 11/28/2018    8:44 AM  Fall Risk   Falls in the past year? 0 0 0 0 0  Number falls in past yr: 0 0 0 0   Injury with Fall? 0 0 0 0   Risk for fall due to : No Fall Risks      Follow up Falls evaluation completed Falls evaluation completed Falls evaluation completed Falls evaluation completed Falls evaluation completed     [place]:  Any stairs in or around the home? Yes  If so, are there any without handrails? No  Home free of loose throw rugs in walkways, pet beds, electrical cords, etc? Yes  Adequate lighting in your home to reduce risk of falls? Yes   ASSISTIVE DEVICES UTILIZED TO PREVENT FALLS:  Life alert? No  Use of a cane, walker or w/c? No  Grab bars in the bathroom? No  Shower chair or bench in shower? No  Elevated toilet seat or a handicapped toilet? No   TIMED UP AND GO:  Was the test performed? No .    Cognitive Function:        06/10/2021   10:26 AM  6CIT Screen  What Year? 0 points  What month? 0 points  What time? 0 points  Count back from 20 0 points  Months in reverse 0 points  Repeat phrase 0 points  Total Score 0 points    Immunizations Immunization History  Administered Date(s) Administered   Fluad Quad(high Dose 65+) 11/28/2018, 11/29/2019, 12/01/2020   Influenza, High Dose Seasonal PF 10/31/2012, 11/12/2014, 11/17/2015, 11/18/2016, 11/22/2017   Influenza,inj,Quad PF,6+ Mos 11/01/2013   PFIZER Comirnaty(Gray Top)Covid-19 Tri-Sucrose Vaccine 05/29/2020   PFIZER(Purple Top)SARS-COV-2 Vaccination 03/06/2019, 03/27/2019   Pfizer Covid-19 Vaccine Bivalent Booster 85yr & up 12/01/2020   Pneumococcal Conjugate-13 11/12/2014   Pneumococcal Polysaccharide-23 11/01/2013   Tdap 11/04/2011   Zoster Recombinat (Shingrix) 06/09/2021    TDAP status: Up to date  Flu Vaccine status: Up to date  Pneumococcal vaccine status: Up to date  Covid-19 vaccine status: Completed vaccines  Qualifies for Shingles Vaccine? Yes   Zostavax completed No   Shingrix Completed?: No.    Education has been provided regarding the importance of this vaccine. Patient has been advised to  call insurance company to determine out of pocket expense if they have not yet received this vaccine. Advised may also receive vaccine at local pharmacy or Health Dept.  Verbalized acceptance and understanding.  Screening Tests Health Maintenance  Topic Date Due   Zoster Vaccines- Shingrix (2 of 2) 08/04/2021   INFLUENZA VACCINE  08/11/2021   TETANUS/TDAP  11/03/2021   COLONOSCOPY (Pts 45-6yr Insurance coverage will need to be confirmed)  05/19/2022   Pneumonia Vaccine 76 Years old  Completed   COVID-19 Vaccine  Completed   Hepatitis C Screening  Completed   HPV VACCINES  Aged Out    Health Maintenance  There are no preventive care reminders to display for this patient.  Colorectal cancer screening: Type of screening: Colonoscopy. Completed 05/18/17. Repeat every 5 years  Lung Cancer Screening: (Low Dose CT Chest recommended if Age 74-80years, 30 pack-year currently smoking OR have quit w/in 15years.) does not qualify.   Lung Cancer Screening Referral: N/A  Additional Screening:  Hepatitis C Screening: does qualify; Completed 11/12/14  Vision Screening: Recommended annual ophthalmology exams for early detection of glaucoma and other disorders of the eye. Is the patient up to date with their annual eye exam?  No  Who is the provider or what is the name of the office in which the patient attends annual eye exams? N/A If pt is not established with a provider, would they like to be referred to a provider to establish care? No .   Dental Screening: Recommended annual dental exams for proper oral hygiene  Community Resource Referral / Chronic Care Management: CRR required this visit?  No   CCM required this visit?  No      Plan:     I have personally reviewed and noted the following in the patient's chart:   Medical and social history Use of alcohol, tobacco or illicit drugs  Current medications and supplements including opioid prescriptions. Patient is not currently taking opioid prescriptions. Functional ability and status Nutritional status Physical activity Advanced directives List of other physicians Hospitalizations,  surgeries, and ER visits in previous 12 months Vitals Screenings to include cognitive, depression, and falls Referrals and appointments  In addition, I have reviewed and discussed with patient certain preventive protocols, quality metrics, and best practice recommendations. A written personalized care plan for preventive services as well as general preventive health recommendations were provided to patient.     SDuard BradyChism, CRiverbend  06/10/2021   Nurse Notes: none   I have reviewed and agree with [facility] documentation.  JKathlene November MD

## 2021-07-27 DIAGNOSIS — C61 Malignant neoplasm of prostate: Secondary | ICD-10-CM | POA: Diagnosis not present

## 2021-07-27 LAB — PSA: PSA: 0.16

## 2021-08-03 DIAGNOSIS — C61 Malignant neoplasm of prostate: Secondary | ICD-10-CM | POA: Diagnosis not present

## 2021-08-03 DIAGNOSIS — C7951 Secondary malignant neoplasm of bone: Secondary | ICD-10-CM | POA: Diagnosis not present

## 2021-08-04 ENCOUNTER — Encounter: Payer: Self-pay | Admitting: Internal Medicine

## 2021-08-25 ENCOUNTER — Telehealth: Payer: Self-pay | Admitting: Internal Medicine

## 2021-08-25 MED ORDER — CARVEDILOL 6.25 MG PO TABS: 6.2500 mg | ORAL_TABLET | Freq: Two times a day (BID) | ORAL | 1 refills | Status: DC

## 2021-08-25 NOTE — Telephone Encounter (Signed)
Rx sent 

## 2021-08-25 NOTE — Telephone Encounter (Signed)
Medication: carvedilol (COREG) 6.25 MG tablet [[id number]]   Has the patient contacted their pharmacy? No.  Prescription appears to be older, but patient said he has been taking it for years  Preferred Pharmacy (with phone number or street name):   [id number] [place] [address], [place] Phone:  [phone] Fax:  [phone]  Agent: Please be advised that RX refills may take up to 3 business days. We ask that you follow-up with your pharmacy.

## 2021-08-28 ENCOUNTER — Other Ambulatory Visit: Payer: Self-pay | Admitting: Internal Medicine

## 2021-09-02 ENCOUNTER — Ambulatory Visit (HOSPITAL_COMMUNITY)
Admission: RE | Admit: 2021-09-02 | Discharge: 2021-09-02 | Disposition: A | Discharge status: Home / Self Care | Payer: Medicare PPO | Source: Ambulatory Visit | Attending: Emergency Medicine | Admitting: Emergency Medicine

## 2021-09-02 DIAGNOSIS — R911 Solitary pulmonary nodule: Secondary | ICD-10-CM | POA: Diagnosis not present

## 2021-09-02 DIAGNOSIS — J9811 Atelectasis: Secondary | ICD-10-CM | POA: Diagnosis not present

## 2021-09-16 ENCOUNTER — Ambulatory Visit: Payer: Medicare PPO | Admitting: Emergency Medicine

## 2021-09-16 ENCOUNTER — Encounter: Payer: Self-pay | Admitting: Emergency Medicine

## 2021-09-16 DIAGNOSIS — R9389 Abnormal findings on diagnostic imaging of other specified body structures: Secondary | ICD-10-CM

## 2021-09-16 NOTE — Progress Notes (Signed)
Subjective:    Patient ID: Joseph Golden, male    DOB: 01-08-1946, 76 y.o.   MRN: [id number]  HPI 76 year old gentleman with history tobacco use (pipe, no inhalation).  He has a history of GERD with hiatal hernia, hypertension and prostate cancer that was treated by prostatectomy and XRT, then XRT to ribs in late 2022.  He had a PET scan in 05/2020 to did not show any lung involvement.  Subsequent lung cancer screening CT chest performed 01/13/2021 showed evidence for pulmonary nodule disease as described below.  PET scan 06/10/2020 reviewed by me shows 3 discrete radiotracer areas of activity in the ribs: Lateral left third rib, posterior medial left fifth rib, anterior right second rib.  There is no evidence of local recurrence in the prostatectomy bed or any evidence of other metastatic disease  LDCT 01/13/2021 reviewed by me, showed some mild paraseptal emphysematous change, a right upper lobe focal linear opacity likely scar.  A left upper lobe peripheral 2.1 cm nodule, part solid 2.4 cm left lower lobe pulmonary nodule.  ROV 03/05/21 --follow-up visit for Mr. 68, 76 year old gentleman with history of prostate cancer treated with prostatectomy, XRT, XRT to the ribs 2022.  Also with GERD, hypertension.  He had a low-dose CT 01/13/2021 that showed a right upper lobe focal linear opacity, left upper lobe peripheral 2.1 cm nodule, part solid 2.4 cm left lower lobe nodule.  The location made this suspicious for radiation change following his rib treatment.  CT chest 02/16/2021 reviewed by me, shows a mild reduction in size and solidity of the left upper lobe peripheral lesion.  There is a large hiatal hernia.  He has small sclerotic lesions in the right second and left third ribs.   ROV 09/16/21 --76 year old gentleman with a history of prostate cancer treated by prostatectomy and XRT, he had XRT to his ribs in 2022.  We have been following a peripheral left upper lobe opacity with serial imaging, smaller on  his scan in February and felt to be consistent with postradiation scarring.  He has undergone a repeat CT on 09/02/2021 as below  CT chest 09/02/21 reviewed by me, shows similar bandlike 2.4 x 1.3 cm groundglass left upper lobe opacity (smaller) just below the left third rib sclerotic lesion consistent with radiation change.  Similar 2.7 cm groundglass opacity in the left lower lobe, again adjacent to treated fifth rib.  There is a new 4 mm right upper lobe anterior pulmonary nodule of unclear significance.  Repeat 1 year   Review of Systems As per Hpi  Past Medical History:  Diagnosis Date   Allergy    SEASONAL   Diverticulosis 38/75/6433   Helicobacter pylori gastritis 04/23/2013   History of hiatal hernia    HTN (hypertension)    Iron deficiency anemia secondary to blood loss (chronic) - Hiatal hernia with Lysbeth Galas erosions 03/01/2013   Other and unspecified hyperlipidemia 10/31/2012   Personal history of colonic adenoma 12/30/2011   12/2011 - 12 mm adenoma with high-grade dysplasia   Prostate cancer ([place]) 02/2017     Family History  Problem Relation Age of Onset   Hypertension Mother    Breast cancer Mother    Brain cancer Father 59   Stomach cancer Other    Hypertension Sister    Hypertension Brother    CVA Maternal Uncle    Colon cancer Neg Hx    Esophageal cancer Neg Hx    Rectal cancer Neg Hx    Prostate cancer  Neg Hx    Diabetes Neg Hx    CAD Neg Hx      Social History   Socioeconomic History   Marital status: Married    Spouse name: Not on file   Number of children: 5   Years of education: Not on file   Highest education level: Not on file  Occupational History   Occupation: retired   Tobacco Use   Smoking status: Former    Types: Pipe    Quit date: 2007    Years since quitting: 16.6   Smokeless tobacco: Never   Tobacco comments:    quit ~ 2005, smoked pipe   Vaping Use   Vaping Use: Never used  Substance and Sexual Activity   Alcohol use: Yes     Alcohol/week: 2.0 standard drinks of alcohol    Types: 2 Cans of beer per week    Comment: social   Drug use: No   Sexual activity: Not Currently  Other Topics Concern   Not on file  Social History Narrative   Married for 50  years. Lives in [place]. One son and four daughters.   Household: wife, 2 daughters and 2 g-son   Social Determinants of Health   Financial Resource Strain: Low Risk  (12/22/2020)   Overall Financial Resource Strain (CARDIA)    Difficulty of Paying Living Expenses: Not very hard  Food Insecurity: No Food Insecurity (12/22/2020)   Hunger Vital Sign    Worried About Running Out of Food in the Last Year: Never true    Ran Out of Food in the Last Year: Never true  Transportation Needs: No Transportation Needs (12/22/2020)   PRAPARE - Hydrologist (Medical): No    Lack of Transportation (Non-Medical): No  Physical Activity: Sufficiently Active (12/22/2020)   Exercise Vital Sign    Days of Exercise per Week: 6 days    Minutes of Exercise per Session: 60 min  Stress: No Stress Concern Present (12/22/2020)   [place]    Feeling of Stress : Not at all  Social Connections: Moderately Integrated (12/22/2020)   Social Connection and Isolation Panel [NHANES]    Frequency of Communication with Friends and Family: More than three times a week    Frequency of Social Gatherings with Friends and Family: Three times a week    Attends Religious Services: 1 to 4 times per year    Active Member of Clubs or Organizations: No    Attends Archivist Meetings: Never    Marital Status: Married  Human resources officer Violence: Not At Risk (12/22/2020)   Humiliation, Afraid, Rape, and Kick questionnaire    Fear of Current or Ex-Partner: No    Emotionally Abused: No    Physically Abused: No    Sexually Abused: No  Worked as a Geneticist, 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 180 tablet 1   losartan-hydrochlorothiazide (HYZAAR) 50-12.5 MG tablet TAKE 1 TABLET BY MOUTH EVERY DAY 90 tablet 1  No facility-administered medications prior to visit.         Objective:   Physical Exam Vitals:   09/16/21 0917  BP: 122/76  Pulse: 68  Temp: 98.1 F (36.7 C)  TempSrc: Oral  SpO2: 98%  Weight: 204 lb 9.6 oz (92.8 kg)  Height: 5' 10.5" (1.791 m)   Gen: Pleasant, well-nourished, in no distress,  normal affect  ENT: No lesions,  mouth clear,  oropharynx clear, no postnasal drip  Neck: No JVD, no stridor  Lungs: No use of accessory muscles, no crackles or wheezing on normal respiration, no wheeze on forced expiration  Cardiovascular: RRR, heart sounds normal, no murmur or gallops, no peripheral edema  Musculoskeletal: No deformities, no cyanosis or clubbing  Neuro: alert, awake, non focal  Skin: Warm, no lesions or rash      Assessment & Plan:   Abnormal CT of the chest Stable CT chest with changes in the left upper and left lower lobes consistent with radiation scar.  These are adjacent to the third and fifth ribs where his XRT was done.  There is also a 4 mm new right upper lobe pulmonary nodule of unclear significance.  We will plan to repeat his CT in August 2024 to ensure interval stability.  We reviewed your CT scan of the chest today.  The inflammatory areas adjacent to your ribs treated with radiation are stable to smaller.  This is good news. We will plan to repeat your CT scan of the chest in August 2024 Please follow with Dr. Lamonte Sakai in  August 2024 after your CT so we can review or sooner if you have any respiratory problems.  Baltazar Apo, MD, PhD 09/16/2021, 9:32 AM [facility] [phone] or if no answer before 7:00PM call [phone] For any issues after 7:00PM please call eLink [phone]

## 2021-09-16 NOTE — Addendum Note (Signed)
Addended by: Gavin Potters R on: 09/16/2021 09:37 AM   Modules accepted: Orders

## 2021-09-16 NOTE — Assessment & Plan Note (Signed)
Stable CT chest with changes in the left upper and left lower lobes consistent with radiation scar.  These are adjacent to the third and fifth ribs where his XRT was done.  There is also a 4 mm new right upper lobe pulmonary nodule of unclear significance.  We will plan to repeat his CT in August 2024 to ensure interval stability.  We reviewed your CT scan of the chest today.  The inflammatory areas adjacent to your ribs treated with radiation are stable to smaller.  This is good news. We will plan to repeat your CT scan of the chest in August 2024 Please follow with Dr. Lamonte Sakai in August 2024 after your CT so we can review or sooner if you have any respiratory problems.

## 2021-09-16 NOTE — Patient Instructions (Signed)
We reviewed your CT scan of the chest today.  The inflammatory areas adjacent to your ribs treated with radiation are stable to smaller.  This is good news. We will plan to repeat your CT scan of the chest in August 2024 Please follow with Dr. Lamonte Sakai in August 2024 after your CT so we can review or sooner if you have any respiratory problems.

## 2021-10-08 ENCOUNTER — Other Ambulatory Visit (HOSPITAL_BASED_OUTPATIENT_CLINIC_OR_DEPARTMENT_OTHER): Payer: Self-pay

## 2021-11-03 DIAGNOSIS — C61 Malignant neoplasm of prostate: Secondary | ICD-10-CM | POA: Diagnosis not present

## 2021-11-13 ENCOUNTER — Other Ambulatory Visit: Payer: Self-pay | Admitting: Internal Medicine

## 2021-11-19 ENCOUNTER — Other Ambulatory Visit (HOSPITAL_COMMUNITY): Payer: Self-pay | Admitting: Nurse Practitioner

## 2021-11-19 DIAGNOSIS — C7951 Secondary malignant neoplasm of bone: Secondary | ICD-10-CM

## 2021-11-19 DIAGNOSIS — R9721 Rising PSA following treatment for malignant neoplasm of prostate: Secondary | ICD-10-CM

## 2021-12-04 ENCOUNTER — Ambulatory Visit (HOSPITAL_COMMUNITY)
Admission: RE | Admit: 2021-12-04 | Discharge: 2021-12-04 | Disposition: A | Discharge status: Home / Self Care | Payer: Medicare PPO | Source: Ambulatory Visit | Attending: Nurse Practitioner | Admitting: Nurse Practitioner

## 2021-12-04 DIAGNOSIS — C7951 Secondary malignant neoplasm of bone: Secondary | ICD-10-CM | POA: Insufficient documentation

## 2021-12-04 DIAGNOSIS — R9721 Rising PSA following treatment for malignant neoplasm of prostate: Secondary | ICD-10-CM | POA: Diagnosis not present

## 2021-12-04 MED ORDER — PIFLIFOLASTAT F 18 (PYLARIFY) INJECTION
9.0000 | Freq: Once | INTRAVENOUS | Status: AC
  Administered 2021-12-04: 9.27 via INTRAVENOUS

## 2021-12-07 ENCOUNTER — Ambulatory Visit (INDEPENDENT_AMBULATORY_CARE_PROVIDER_SITE_OTHER): Payer: Medicare PPO | Admitting: Internal Medicine

## 2021-12-07 ENCOUNTER — Encounter: Payer: Self-pay | Admitting: Internal Medicine

## 2021-12-07 VITALS — BP 126/82 | HR 66 | Temp 97.6°F | Resp 18 | Ht 70.5 in | Wt 210.2 lb

## 2021-12-07 DIAGNOSIS — E785 Hyperlipidemia, unspecified: Secondary | ICD-10-CM

## 2021-12-07 DIAGNOSIS — Z23 Encounter for immunization: Secondary | ICD-10-CM

## 2021-12-07 DIAGNOSIS — E119 Type 2 diabetes mellitus without complications: Secondary | ICD-10-CM

## 2021-12-07 DIAGNOSIS — Z0001 Encounter for general adult medical examination with abnormal findings: Secondary | ICD-10-CM

## 2021-12-07 DIAGNOSIS — I10 Essential (primary) hypertension: Secondary | ICD-10-CM

## 2021-12-07 DIAGNOSIS — Z0000 Encounter for general adult medical examination without abnormal findings: Secondary | ICD-10-CM

## 2021-12-07 LAB — LIPID PANEL
Cholesterol: 186 mg/dL (ref 0–200)
HDL: 64.8 mg/dL (ref >39.00)
LDL Cholesterol: 107 mg/dL — ABNORMAL HIGH (ref 0–99)
NonHDL: 121.58
Total CHOL/HDL Ratio: 3
Triglycerides: 71 mg/dL (ref 0.0–149.0)
VLDL: 14.2 mg/dL (ref 0.0–40.0)

## 2021-12-07 LAB — CBC WITH DIFFERENTIAL/PLATELET
Basophils Absolute: 0 10*3/uL (ref 0.0–0.1)
Basophils Relative: 0.3 % (ref 0.0–3.0)
Eosinophils Absolute: 0.3 10*3/uL (ref 0.0–0.7)
Eosinophils Relative: 5.6 % — ABNORMAL HIGH (ref 0.0–5.0)
HCT: 36.4 % — ABNORMAL LOW (ref 39.0–52.0)
Hemoglobin: 12.2 g/dL — ABNORMAL LOW (ref 13.0–17.0)
Lymphocytes Relative: 17.6 % (ref 12.0–46.0)
Lymphs Abs: 0.9 10*3/uL (ref 0.7–4.0)
MCHC: 33.4 g/dL (ref 30.0–36.0)
MCV: 89.4 fl (ref 78.0–100.0)
Monocytes Absolute: 0.5 10*3/uL (ref 0.1–1.0)
Monocytes Relative: 9.5 % (ref 3.0–12.0)
Neutro Abs: 3.3 10*3/uL (ref 1.4–7.7)
Neutrophils Relative %: 67 % (ref 43.0–77.0)
Platelets: 225 10*3/uL (ref 150.0–400.0)
RBC: 4.07 Mil/uL — ABNORMAL LOW (ref 4.22–5.81)
RDW: 16.6 % — ABNORMAL HIGH (ref 11.5–15.5)
WBC: 4.9 10*3/uL (ref 4.0–10.5)

## 2021-12-07 LAB — BASIC METABOLIC PANEL
BUN: 13 mg/dL (ref 6–23)
CO2: 28 mEq/L (ref 19–32)
Calcium: 9.6 mg/dL (ref 8.4–10.5)
Chloride: 101 mEq/L (ref 96–112)
Creatinine, Ser: 0.87 mg/dL (ref 0.40–1.50)
GFR: 85.08 mL/min (ref >60.00)
Glucose, Bld: 106 mg/dL — ABNORMAL HIGH (ref 70–99)
Potassium: 4 mEq/L (ref 3.5–5.1)
Sodium: 136 mEq/L (ref 135–145)

## 2021-12-07 LAB — HEMOGLOBIN A1C: Hgb A1c MFr Bld: 6.6 % — ABNORMAL HIGH (ref 4.6–6.5)

## 2021-12-07 NOTE — Assessment & Plan Note (Signed)
-  Tdap 2013.  Recommend booster. - PNM shot 2015;  prevnar 13: 11-12-14.  PNM 20: Today -S/p shingrex  -RSV discussed - covid vax UTD  - flu shot today -CCS: 12-2011 colonoscopy: large polyp  Flex sigmoidoscopy 11 -14  no residual polyp cscope : 05/2017, no polyps, 5 years per report -Prostate cancer dx 02/2017 .  Since the last time I saw him, mets were found, PSA November 2021: 331. PSA 07/25/2020: 0.3, had radiation therapy.  PSA October 2023 >>  0.3, f/u by urology -Diet and exercise discussed.  -Labs:  BMP, CBC, A1c, FLP -POA discussed

## 2021-12-07 NOTE — Assessment & Plan Note (Addendum)
Here for CPX DM: Previously prediabetes, last A1c 6.5.  Currently on diet controlled.  Check A1c. HTN: Reports great ambulatory BPs, on amlodipine, carvedilol and Hyzaar.  Checking labs High cholesterol: On Lipitor 20, now with a Dx of DM, LDL goal around 100.  Checking FLP. Pulmonary nodule, LUL.  Found on CT chest 01/13/2021, saw pulmonary.  F/u CT chest done 08-2021, stable Prostate cancer: Per urology. RTC 4 months

## 2021-12-07 NOTE — Progress Notes (Signed)
Subjective:    Patient ID: Joseph Golden, male    DOB: May 05, 1945, 76 y.o.   MRN: [id number]  DOS:  12/07/2021 Type of visit - description: CPX Here for CPX. Since the last office visit is doing well. Denies chest pain, difficulty breathing. Ambulatory BPs are normal  Review of Systems  Other than above, a 14 point review of systems is negative     Past Medical History:  Diagnosis Date   Allergy    SEASONAL   Diverticulosis 40/98/1191   Helicobacter pylori gastritis 04/23/2013   History of hiatal hernia    HTN (hypertension)    Iron deficiency anemia secondary to blood loss (chronic) - Hiatal hernia with Lysbeth Galas erosions 03/01/2013   Other and unspecified hyperlipidemia 10/31/2012   Personal history of colonic adenoma 12/30/2011   12/2011 - 12 mm adenoma with high-grade dysplasia   Prostate cancer ([place]) 02/2017    Past Surgical History:  Procedure Laterality Date   COLONOSCOPY  2013   LYMPHADENECTOMY Bilateral 06/08/2017   Procedure: LYMPHADENECTOMY;  Surgeon: Lucas Mallow, MD;  Location: [facility];  Service: Urology;  Laterality: Bilateral;   NO PAST SURGERIES     PROSTATE BIOPSY     ROBOT ASSISTED LAPAROSCOPIC RADICAL PROSTATECTOMY N/A 06/08/2017   Procedure: XI ROBOTIC ASSISTED LAPAROSCOPIC RADICAL PROSTATECTOMY;  Surgeon: Lucas Mallow, MD;  Location: [facility];  Service: Urology;  Laterality: N/A;   Social History   Socioeconomic History   Marital status: Married    Spouse name: Not on file   Number of children: 5   Years of education: Not on file   Highest education level: Not on file  Occupational History   Occupation: retired   Tobacco Use   Smoking status: Former    Types: Pipe    Quit date: 2007    Years since quitting: 16.9   Smokeless tobacco: Never   Tobacco comments:    quit ~ 2005, smoked pipe   Vaping Use   Vaping Use: Never used  Substance and Sexual Activity   Alcohol use: Yes    Alcohol/week: 2.0 standard drinks of alcohol     Types: 2 Cans of beer per week    Comment: social   Drug use: No   Sexual activity: Not Currently  Other Topics Concern   Not on file  Social History Narrative   Married for 50  years. Lives in [place]. One son and four daughters.   Household: wife, 1 daughters and 1 g-son   Social Determinants of Health   Financial Resource Strain: Low Risk  (12/22/2020)   Overall Financial Resource Strain (CARDIA)    Difficulty of Paying Living Expenses: Not very hard  Food Insecurity: No Food Insecurity (12/22/2020)   Hunger Vital Sign    Worried About Running Out of Food in the Last Year: Never true    Ran Out of Food in the Last Year: Never true  Transportation Needs: No Transportation Needs (12/22/2020)   PRAPARE - Hydrologist (Medical): No    Lack of Transportation (Non-Medical): No  Physical Activity: Sufficiently Active (12/22/2020)   Exercise Vital Sign    Days of Exercise per Week: 6 days    Minutes of Exercise per Session: 60 min  Stress: No Stress Concern Present (12/22/2020)   [place]    Feeling of Stress : Not at all  Social Connections: Moderately Integrated (12/22/2020)   Social  Connection and Isolation Panel [NHANES]    Frequency of Communication with Friends and Family: More than three times a week    Frequency of Social Gatherings with Friends and Family: Three times a week    Attends Religious Services: 1 to 4 times per year    Active Member of Clubs or Organizations: No    Attends Archivist Meetings: Never    Marital Status: Married  Human resources officer Violence: Not At Risk (12/22/2020)   Humiliation, Afraid, Rape, and Kick questionnaire    Fear of Current or Ex-Partner: No    Emotionally Abused: No    Physically Abused: No    Sexually Abused: No    Current Outpatient Medications  Medication Instructions   amLODipine (NORVASC) 10 mg, Oral, Daily    atorvastatin (LIPITOR) 20 MG tablet TAKE 1 TABLET BY MOUTH EVERYDAY AT BEDTIME   carvedilol (COREG) 6.25 mg, Oral, 2 times daily with meals   losartan-hydrochlorothiazide (HYZAAR) 50-12.5 MG tablet TAKE 1 TABLET BY MOUTH EVERY DAY       Objective:   Physical Exam BP 126/82   Pulse 66   Temp 97.6 F (36.4 C) (Oral)   Resp 18   Ht 5' 10.5" (1.791 m)   Wt 210 lb 4 oz (95.4 kg)   SpO2 95%   BMI 29.74 kg/m  General: Well developed, NAD, BMI noted Neck: No  thyromegaly  HEENT:  Normocephalic . Face symmetric, atraumatic Lungs:  CTA B Normal respiratory effort, no intercostal retractions, no accessory muscle use. Heart: RRR,  no murmur.  Abdomen:  Not distended, soft, non-tender. No rebound or rigidity.   Lower extremities: no pretibial edema bilaterally  Skin: Exposed areas without rash. Not pale. Not jaundice Neurologic:  alert & oriented X3.  Speech normal, gait appropriate for age and unassisted Strength symmetric and appropriate for age.  Psych: Cognition and judgment appear intact.  Cooperative with normal attention span and concentration.  Behavior appropriate. No anxious or depressed appearing.     Assessment     Assessment  DM (A1c 6.15 May 2021) HTN Hyperlipidemia Prostate ca dx 2019; s/p radical prostatectomy 05/2017, finished  XRT 12/2017 GI: --Colon polyps --Iron deficient anemia, --EGD 4-15 : Gastritis, H. pylori positive: Status post treatment. HH likely causing  [place] erosions ---> a  cause for chronic blood loss.  + H. pylori gastritis 5 /2015,(-) breath test 11-2014  12/2019: Subdural hematoma, large, s/p SEPS  PLAN Here for CPX DM: Previously prediabetes, last A1c 6.5.  Currently on diet controlled.  Check A1c. HTN: Reports great ambulatory BPs, on amlodipine, carvedilol and Hyzaar.  Checking labs High cholesterol: On Lipitor 20, now with a Dx of DM, LDL goal around 100.  Checking FLP. Pulmonary nodule, LUL.  Found on CT chest 01/13/2021, saw  pulmonary.  F/u CT chest done 08-2021, stable Prostate cancer: Per urology. RTC 4 months   In addition to CPX, I address all his chronic medical problems and reviewed the chart.

## 2021-12-07 NOTE — Patient Instructions (Addendum)
Vaccines I recommend:  RSV vaccine  Continue checking your blood pressures. BP GOAL is between 110/65 and  135/85. If it is consistently higher or lower, let me know    GO TO THE LAB : Get the blood work     [place], [place] back for   a checkup in 4 months    Do you have a "Living will" or "[place] of attorney"? (Advance care planning documents)  If you already have a living will or healthcare power of attorney, is recommended you bring the copy to be scanned in your chart. The document will be available to all the doctors you see in the system.  If you don't have one, please consider create one.  Advance care planning is a process that supports adults in  understanding and sharing their preferences regarding future medical care.   The patient's preferences are recorded in documents called Advance Directives and the can be modified at any time while the patient is in full mental capacity.   The documentation should be available at all times to the patient, the family and the healthcare providers.   This legal documents direct the family or surrogate to make the decision if the patient is not capable to do so.     More information at:  [email or web address]

## 2021-12-08 ENCOUNTER — Ambulatory Visit: Payer: Medicare PPO

## 2021-12-08 MED ORDER — ATORVASTATIN CALCIUM 40 MG PO TABS: 40.0000 mg | ORAL_TABLET | Freq: Every day | ORAL | 1 refills | Status: DC

## 2021-12-08 NOTE — Addendum Note (Signed)
Addended byDamita Dunnings D on: 12/08/2021 03:40 PM   Modules accepted: Orders

## 2021-12-11 ENCOUNTER — Telehealth: Payer: Self-pay | Admitting: Internal Medicine

## 2021-12-11 ENCOUNTER — Encounter: Payer: Self-pay | Admitting: Internal Medicine

## 2021-12-11 NOTE — Telephone Encounter (Signed)
Received an urgent referral from Dr. Gloriann Loan for this patient for "possible lymphoma or colon cancer. new findings on PET scan ."  Patient was last seen by Dr. Carlean Purl in 2019 for colonoscopy, so his OV would be considered to be a new patient.  Dr. Celesta Aver first available appointment is Tuesday, 03/02/22.  Please call patient and advise.  Thank you.

## 2021-12-11 NOTE — Telephone Encounter (Signed)
Pt made aware of referral sent over by Dr. Gloriann Loan: Pt was scheduled to see Alonza Bogus PA on 12/25/2021 at 1:30 PM: Pt made aware: Address provided Pt verbalized understanding with all questions answered.

## 2021-12-14 ENCOUNTER — Telehealth: Payer: Self-pay | Admitting: Physician Assistant

## 2021-12-14 NOTE — Telephone Encounter (Signed)
Scheduled appointment per referral. Patient is aware of appointment date and time. Patient is aware to arrive 15 mins prior to appointment time and to bring updated insurance cards. Patient is aware of location.   

## 2021-12-16 ENCOUNTER — Inpatient Hospital Stay: Payer: Medicare PPO

## 2021-12-16 ENCOUNTER — Encounter: Payer: Self-pay | Admitting: Physician Assistant

## 2021-12-16 ENCOUNTER — Inpatient Hospital Stay: Payer: Medicare PPO | Attending: Physician Assistant | Admitting: Physician Assistant

## 2021-12-16 VITALS — BP 128/82 | HR 76 | Temp 97.4°F | Resp 18 | Wt 206.9 lb

## 2021-12-16 DIAGNOSIS — Z803 Family history of malignant neoplasm of breast: Secondary | ICD-10-CM | POA: Insufficient documentation

## 2021-12-16 DIAGNOSIS — M7989 Other specified soft tissue disorders: Secondary | ICD-10-CM | POA: Insufficient documentation

## 2021-12-16 DIAGNOSIS — C61 Malignant neoplasm of prostate: Secondary | ICD-10-CM | POA: Insufficient documentation

## 2021-12-16 DIAGNOSIS — Z8719 Personal history of other diseases of the digestive system: Secondary | ICD-10-CM | POA: Insufficient documentation

## 2021-12-16 DIAGNOSIS — Z79899 Other long term (current) drug therapy: Secondary | ICD-10-CM | POA: Insufficient documentation

## 2021-12-16 DIAGNOSIS — I10 Essential (primary) hypertension: Secondary | ICD-10-CM | POA: Diagnosis not present

## 2021-12-16 DIAGNOSIS — I700 Atherosclerosis of aorta: Secondary | ICD-10-CM | POA: Diagnosis not present

## 2021-12-16 DIAGNOSIS — C7951 Secondary malignant neoplasm of bone: Secondary | ICD-10-CM | POA: Insufficient documentation

## 2021-12-16 DIAGNOSIS — Z800 Family history of malignant neoplasm of digestive organs: Secondary | ICD-10-CM | POA: Diagnosis not present

## 2021-12-16 DIAGNOSIS — Z808 Family history of malignant neoplasm of other organs or systems: Secondary | ICD-10-CM | POA: Diagnosis not present

## 2021-12-16 DIAGNOSIS — Z87891 Personal history of nicotine dependence: Secondary | ICD-10-CM | POA: Diagnosis not present

## 2021-12-16 DIAGNOSIS — D374 Neoplasm of uncertain behavior of colon: Secondary | ICD-10-CM | POA: Insufficient documentation

## 2021-12-16 DIAGNOSIS — Z8249 Family history of ischemic heart disease and other diseases of the circulatory system: Secondary | ICD-10-CM | POA: Insufficient documentation

## 2021-12-16 DIAGNOSIS — E785 Hyperlipidemia, unspecified: Secondary | ICD-10-CM | POA: Diagnosis not present

## 2021-12-16 DIAGNOSIS — Z823 Family history of stroke: Secondary | ICD-10-CM | POA: Diagnosis not present

## 2021-12-16 DIAGNOSIS — Z9079 Acquired absence of other genital organ(s): Secondary | ICD-10-CM | POA: Insufficient documentation

## 2021-12-16 LAB — CBC WITH DIFFERENTIAL (CANCER CENTER ONLY)
Abs Immature Granulocytes: 0.01 10*3/uL (ref 0.00–0.07)
Basophils Absolute: 0 10*3/uL (ref 0.0–0.1)
Basophils Relative: 0 %
Eosinophils Absolute: 0.3 10*3/uL (ref 0.0–0.5)
Eosinophils Relative: 5 %
HCT: 36.6 % — ABNORMAL LOW (ref 39.0–52.0)
Hemoglobin: 12.3 g/dL — ABNORMAL LOW (ref 13.0–17.0)
Immature Granulocytes: 0 %
Lymphocytes Relative: 16 %
Lymphs Abs: 0.9 10*3/uL (ref 0.7–4.0)
MCH: 30 pg (ref 26.0–34.0)
MCHC: 33.6 g/dL (ref 30.0–36.0)
MCV: 89.3 fL (ref 80.0–100.0)
Monocytes Absolute: 0.5 10*3/uL (ref 0.1–1.0)
Monocytes Relative: 9 %
Neutro Abs: 3.9 10*3/uL (ref 1.7–7.7)
Neutrophils Relative %: 70 %
Platelet Count: 236 10*3/uL (ref 150–400)
RBC: 4.1 MIL/uL — ABNORMAL LOW (ref 4.22–5.81)
RDW: 15.9 % — ABNORMAL HIGH (ref 11.5–15.5)
WBC Count: 5.6 10*3/uL (ref 4.0–10.5)
nRBC: 0 % (ref 0.0–0.2)

## 2021-12-16 LAB — CMP (CANCER CENTER ONLY)
ALT: 18 U/L (ref 0–44)
AST: 17 U/L (ref 15–41)
Albumin: 4.1 g/dL (ref 3.5–5.0)
Alkaline Phosphatase: 93 U/L (ref 38–126)
Anion gap: 7 (ref 5–15)
BUN: 16 mg/dL (ref 8–23)
CO2: 28 mmol/L (ref 22–32)
Calcium: 9.9 mg/dL (ref 8.9–10.3)
Chloride: 105 mmol/L (ref 98–111)
Creatinine: 1.26 mg/dL — ABNORMAL HIGH (ref 0.61–1.24)
GFR, Estimated: 60 mL/min — ABNORMAL LOW (ref >60)
Glucose, Bld: 144 mg/dL — ABNORMAL HIGH (ref 70–99)
Potassium: 3.8 mmol/L (ref 3.5–5.1)
Sodium: 140 mmol/L (ref 135–145)
Total Bilirubin: 0.4 mg/dL (ref 0.3–1.2)
Total Protein: 7.7 g/dL (ref 6.5–8.1)

## 2021-12-16 LAB — CEA (ACCESS): CEA (CHCC): 1.66 ng/mL (ref 0.00–5.00)

## 2021-12-16 NOTE — Progress Notes (Signed)
[place] [phone]   [phone]  INITIAL CONSULTATION:  Patient Care Team: Colon Branch, MD as PCP - General (Internal Medicine) Gatha Mayer, MD as Consulting Physician (Gastroenterology) Lucas Mallow, MD as Consulting Physician (Urology) Tyler Pita, MD as Consulting Physician (Radiation Oncology) Delice Bison, Charlestine Massed, NP as Nurse Practitioner (Hematology and Oncology) Harmon Pier, RN as Registered Nurse  CHIEF COMPLAINTS/PURPOSE OF CONSULTATION:  Soft tissue density/mass in the abdomen  HISTORY OF PRESENTING ILLNESS:  Joseph Golden 76 y.o. male with medical history significant for metastatic prostate cancer, hypertension, iron deficiency anemia and h.pylori gastritis presents to the diagnostic clinic for abnormal PET scan concerning for malignancy. He is unaccompanied for this visit.   On review of the previous records, Joseph Golden is undergoing surveillance imaging due to history of prostate cancer with oligometastatic disease the ribs. Most recently he underwent PET(PSMA) scan on 12/04/2021 that revealed mild to moderately PSMA avid soft tissue mass along the medial margin of the ascending colon adjacent to the hepatic flexure.  On exam today, Joseph Golden reports his energy levels are stable. He is able to complete all his daily activities on his own. He has intentionally lost approximately 4-5 lbs in the last several weeks due to eating healthier. He denies nausea, vomiting or abdominal pain. He reports having more frequent bowel movements in a day but stools are normal. He denies any easy bruising or signs of bleeding. He denies fevers, chills, night sweats, shortness of breath, chest pain or cough. He has no other complaints. Rest of the 10 point ROS is below.   MEDICAL HISTORY:  Past Medical History:  Diagnosis Date   Allergy    SEASONAL   Diverticulosis 95/18/8416   Helicobacter pylori gastritis  04/23/2013   History of hiatal hernia    HTN (hypertension)    Iron deficiency anemia secondary to blood loss (chronic) - Hiatal hernia with Lysbeth Galas erosions 03/01/2013   Other and unspecified hyperlipidemia 10/31/2012   Personal history of colonic adenoma 12/30/2011   12/2011 - 12 mm adenoma with high-grade dysplasia   Prostate cancer ([place]) 02/2017    SURGICAL HISTORY: Past Surgical History:  Procedure Laterality Date   COLONOSCOPY  2013   LYMPHADENECTOMY Bilateral 06/08/2017   Procedure: LYMPHADENECTOMY;  Surgeon: Lucas Mallow, MD;  Location: [facility];  Service: Urology;  Laterality: Bilateral;   NO PAST SURGERIES     PROSTATE BIOPSY     ROBOT ASSISTED LAPAROSCOPIC RADICAL PROSTATECTOMY N/A 06/08/2017   Procedure: XI ROBOTIC ASSISTED LAPAROSCOPIC RADICAL PROSTATECTOMY;  Surgeon: Lucas Mallow, MD;  Location: [facility];  Service: Urology;  Laterality: N/A;    SOCIAL HISTORY: Social History   Socioeconomic History   Marital status: Married    Spouse name: Not on file   Number of children: 5   Years of education: Not on file   Highest education level: Not on file  Occupational History   Occupation: retired   Tobacco Use   Smoking status: Former    Types: Pipe    Quit date: 2007    Years since quitting: 16.9   Smokeless tobacco: Never   Tobacco comments:    quit ~ 2005, smoked pipe   Vaping Use   Vaping Use: Never used  Substance and Sexual Activity   Alcohol use: Yes    Alcohol/week: 2.0 standard drinks of alcohol    Types: 2 Cans of beer per week  Comment: social   Drug use: No   Sexual activity: Not Currently  Other Topics Concern   Not on file  Social History Narrative   Married for 50  years. Lives in [place]. One son and four daughters.   Household: wife, 1 daughters and 1 g-son   Social Determinants of Health   Financial Resource Strain: Low Risk  (12/22/2020)   Overall Financial Resource Strain (CARDIA)    Difficulty of Paying Living Expenses:  Not very hard  Food Insecurity: No Food Insecurity (12/22/2020)   Hunger Vital Sign    Worried About Running Out of Food in the Last Year: Never true    Ran Out of Food in the Last Year: Never true  Transportation Needs: No Transportation Needs (12/22/2020)   PRAPARE - Hydrologist (Medical): No    Lack of Transportation (Non-Medical): No  Physical Activity: Sufficiently Active (12/22/2020)   Exercise Vital Sign    Days of Exercise per Week: 6 days    Minutes of Exercise per Session: 60 min  Stress: No Stress Concern Present (12/22/2020)   [place]    Feeling of Stress : Not at all  Social Connections: Moderately Integrated (12/22/2020)   Social Connection and Isolation Panel [NHANES]    Frequency of Communication with Friends and Family: More than three times a week    Frequency of Social Gatherings with Friends and Family: Three times a week    Attends Religious Services: 1 to 4 times per year    Active Member of Clubs or Organizations: No    Attends Archivist Meetings: Never    Marital Status: Married  Human resources officer Violence: Not At Risk (12/22/2020)   Humiliation, Afraid, Rape, and Kick questionnaire    Fear of Current or Ex-Partner: No    Emotionally Abused: No    Physically Abused: No    Sexually Abused: No    FAMILY HISTORY: Family History  Problem Relation Age of Onset   Hypertension Mother    Breast cancer Mother    Brain cancer Father 64   Stomach cancer Other    Hypertension Sister    Hypertension Brother    CVA Maternal Uncle    Colon cancer Neg Hx    Esophageal cancer Neg Hx    Rectal cancer Neg Hx    Prostate cancer Neg Hx    Diabetes Neg Hx    CAD Neg Hx     ALLERGIES:  has No Known Allergies.  MEDICATIONS:  Current Outpatient Medications  Medication Sig Dispense Refill   amLODipine (NORVASC) 10 MG tablet Take 1 tablet (10 mg total) by  mouth daily. 90 tablet 1   atorvastatin (LIPITOR) 40 MG tablet Take 1 tablet (40 mg total) by mouth at bedtime. 90 tablet 1   carvedilol (COREG) 6.25 MG tablet Take 1 tablet (6.25 mg total) by mouth 2 (two) times daily with a meal. 180 tablet 1   losartan-hydrochlorothiazide (HYZAAR) 50-12.5 MG tablet TAKE 1 TABLET BY MOUTH EVERY DAY 90 tablet 1   No current facility-administered medications for this visit.    REVIEW OF SYSTEMS:   Constitutional: ( - ) fevers, ( - )  chills , ( - ) night sweats Eyes: ( - ) blurriness of vision, ( - ) double vision, ( - ) watery eyes Ears, nose, mouth, throat, and face: ( - ) mucositis, ( - ) sore throat Respiratory: ( - ) cough, ( - )  dyspnea, ( - ) wheezes Cardiovascular: ( - ) palpitation, ( - ) chest discomfort, ( - ) lower extremity swelling Gastrointestinal:  ( - ) nausea, ( - ) heartburn, ( - ) change in bowel habits Skin: ( - ) abnormal skin rashes Lymphatics: ( - ) new lymphadenopathy, ( - ) easy bruising Neurological: ( - ) numbness, ( - ) tingling, ( - ) new weaknesses Behavioral/Psych: ( - ) mood change, ( - ) new changes  All other systems were reviewed with the patient and are negative.  PHYSICAL EXAMINATION: ECOG PERFORMANCE STATUS: 0 - Asymptomatic  Vitals:   12/16/21 1208  BP: 128/82  Pulse: 76  Resp: 18  Temp: (!) 97.4 F (36.3 C)  SpO2: 99%   Filed Weights   12/16/21 1208  Weight: 206 lb 14.4 oz (93.8 kg)    GENERAL: well appearing male in NAD  SKIN: skin color, texture, turgor are normal, no rashes or significant lesions EYES: conjunctiva are pink and non-injected, sclera clear OROPHARYNX: no exudate, no erythema; lips, buccal mucosa, and tongue normal  NECK: supple, non-tender LYMPH:  no palpable lymphadenopathy in the cervical, axillary or supraclavicular lymph nodes.  LUNGS: clear to auscultation and percussion with normal breathing effort HEART: regular rate & rhythm and no murmurs and no lower extremity  edema ABDOMEN: soft, non-tender, non-distended, normal bowel sounds Musculoskeletal: no cyanosis of digits and no clubbing  PSYCH: alert & oriented x 3, fluent speech NEURO: no focal motor/sensory deficits  LABORATORY DATA:  I have reviewed the data as listed    Latest Ref Rng & Units 12/07/2021   10:51 AM 06/09/2021    9:53 AM 12/01/2020    9:40 AM  CBC  WBC 4.0 - 10.5 K/uL 4.9  4.5  4.1   Hemoglobin 13.0 - 17.0 g/dL 12.2  12.2  12.3   Hematocrit 39.0 - 52.0 % 36.4  36.5  37.1   Platelets 150.0 - 400.0 K/uL 225.0  213.0  248.0        Latest Ref Rng & Units 12/07/2021   10:51 AM 06/09/2021    9:53 AM 12/01/2020    9:40 AM  CMP  Glucose 70 - 99 mg/dL 106  110  105   BUN 6 - 23 mg/dL '13  15  15   '$ Creatinine 0.40 - 1.50 mg/dL 0.87  0.94  0.97   Sodium 135 - 145 mEq/L 136  140  139   Potassium 3.5 - 5.1 mEq/L 4.0  4.3  4.6   Chloride 96 - 112 mEq/L 101  104  104   CO2 19 - 32 mEq/L '28  28  29   '$ Calcium 8.4 - 10.5 mg/dL 9.6  9.9  9.9   Total Protein 6.0 - 8.3 g/dL  7.0  7.2   Total Bilirubin 0.2 - 1.2 mg/dL  0.6  0.5   Alkaline Phos 39 - 117 U/L  83  77   AST 0 - 37 U/L  18  21   ALT 0 - 53 U/L  20  20      RADIOGRAPHIC STUDIES: I have personally reviewed the radiological images as listed and agreed with the findings in the report. NM PET (PSMA) SKULL TO MID THIGH  Result Date: 12/07/2021 CLINICAL DATA:  Increasing PSA despite prior prostatectomy, previous treatment for bone metastasis by report. EXAM: NUCLEAR MEDICINE PET SKULL BASE TO THIGH TECHNIQUE: 9.27 mCi F18 Piflufolastat (Pylarify) was injected intravenously. Full-ring PET imaging was performed from the skull base  to thigh after the radiotracer. CT data was obtained and used for attenuation correction and anatomic localization. COMPARISON:  Jun 10, 2020. FINDINGS: NECK No radiotracer activity in neck lymph nodes. Incidental CT finding: None. CHEST Focal area of mild to moderate uptake about the RIGHT hilum showing a  maximum SUV of 5.16 no corresponding lesion visible on CT images (image 86/4) not substantially changed compared to previous imaging. No additional areas of increased radiotracer accumulation in the chest. Incidental CT finding: Calcified aortic atherosclerosis. Normal heart size. No pericardial effusion. Large hiatal hernia with inverted stomach its standing into the chest similar to previous imaging. Trace amount of fluid adjacent to the hiatal hernia is similarly unchanged. No consolidation. No sign of pleural effusion. Basilar scarring and atelectasis. Airways are patent. ABDOMEN/PELVIS Prostate: Post prostatectomy. No focal area of radiotracer accumulation to suggest local disease recurrence. Lymph nodes: No abnormal radiotracer accumulation within pelvic or abdominal nodes. Liver: No evidence of liver metastasis. Other: Soft tissue density along the medial margin of the ascending colon adjacent to the hepatic flexure (image 155/4) 3.1 x 1.5 cm. Subtle area of nodularity in this location on the prior study approximately 1 cm. Maximum SUV of 4.93. Incidental CT finding: Marked hepatic steatosis more in the RIGHT than the LEFT hepatic lobe partially obscures underlying low-density lesions that were seen previously and are unchanged. Areas are not associated with increased accumulation of radiotracer and are likely cysts, the largest of which is approximately 5.2 cm greatest axial dimension. No acute findings relative to pancreas, spleen, adrenal glands and kidneys. Post prostatectomy with distortion of the urinary bladder is expected showing no change. Diverticulosis of the colon. Area above does not appear to be associated with discrete diverticulum. Large hiatal hernia. Aortic atherosclerosis. No adenopathy by size criteria in the abdomen or in the pelvis. SKELETON Signs of PSMA avid skeletal disease, now resolved. There is a small 9 avid focus in the calvarium with sclerosis (image 3/4) 8 mm. This is  unchanged. Spinal degenerative changes. No destructive bone findings are new sclerotic foci IMPRESSION: 1. Signs of mild-to-moderately PSMA avid soft tissue along the medial margin of the ascending colon adjacent to the hepatic flexure, lesion has enlarged since previous imaging. This a be an unusual site of metastasis from prostate cancer, would therefore include the possibility of lymphoma and primary colonic neoplasm though this appears extraluminal. Contrasted CT imaging of the abdomen and pelvis may be helpful as an initial step in further evaluating this finding. 2. Post prostatectomy without signs of local disease recurrence. 3. Resolution of PSMA avidity with respect to rib lesions, now subtle sclerosis with no new bony lesions identified. 4. Nonspecific uptake about the RIGHT hilum. Contrasted imaging of the chest could be performed for further evaluation of this area or consider attention on follow-up. PSMA activity in this area is not changed compared to prior imaging. 5. Marked hepatic steatosis RIGHT lobe greater than LEFT. 6. Large hiatal hernia with inverted stomach its standing into the chest similar to previous imaging. 7. Aortic atherosclerosis. Aortic Atherosclerosis (ICD10-I70.0). Electronically Signed   By: Zetta Bills M.D.   On: 12/07/2021 10:08    ASSESSMENT & PLAN Joseph Golden is a 76 y.o. male who presents to the diagnostic clinic due to recent PET imaging concerning for malignancy. We reviewed the PET imaging and the suspicious soft tissue lesion near the ascending colon. We recommend patient proceed with CT CAP with contrast to further evaluate the lesion in question. We discussed the  need for tissue biopsy. If there are no other targetable lesions identified with upcoming CT scan, we will refer patient to general surgery to evaluate for surgical biopsy.     #Abdominal soft tissue density/mass: --Measuring 3.1 x 1.5 cm along the ascending colon adjacent to the hepatic flexure.   --Labs today to check CBC, CMP, CEA, CA125, PSA and testosterone levels --Need CT CAP with contrast to further evaluate --Based on CT imaging, determine how to obtain tissue biopsy, either surgical versus percutaneous. --Patient is scheduled for GI consultation next Friday, 12/25/2021.   #H/O prostate cancer with oligometastatic disease involving 3 ribs: --Under the care of Urologist, Dr. Link Snuffer.  --Underwent robotic radical prostatectomy on 5/29/20219. Pathology showed prostatic adenocarcinoma, gleason score 4 + 5 = 9. 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 Lymph nodes negative for malignancy. Pathologic stage [id number].  --Received adjuvant radiation to prostate, seminal vesicles, and pelvic lymph nodes from 11/09/2017-01/02/2018 --Underwent radiation to  left third, left fifth and right second ribs from 08/20/2020-08/29/2020.  --Currently receives ADT therapy.  --Labs from 11/03/2021 showed PSA 0.32 and testosterone <10.    No orders of the defined types were placed in this encounter.   All questions were answered. The patient knows to call the clinic with any problems, questions or concerns.  I have spent a total of 60 minutes minutes of face-to-face and non-face-to-face time, preparing to see the patient, obtaining and/or reviewing separately obtained history, performing a medically appropriate examination, counseling and educating the patient, ordering tests/procedures, documenting clinical information in the electronic health record and care coordination.   Dede Query, PA-C Department of Hematology/Oncology [facility] at [facility] Phone: [phone]  Patient was seen with Dr. Lorenso Courier  I have read the above note and personally examined the patient. I agree with the assessment and plan as noted above.  Briefly Joseph Golden is a 76 year old male who presents for evaluation of a colonic mass.  He has a history of prostate cancer  previously treated with surgical resection and subsequent radiation therapy due to invasion of the seminal vesicles.  At this time it is not clear if the colonic mass is arising from the colon or attached exteriorly.  Patient has consultation scheduled with gastroenterology on 12/25/2021 for consideration of a colonoscopy to evaluate for colonic mass.  In the event that the mass is not seen within the lumen would need to pursue biopsy via other means.  Today in clinic we will order PSA marker as well as CEA, CA125, and testosterone levels.  The patient voiced understanding of the imaging findings and our plan moving forward.   Ledell Peoples, MD Department of Hematology/Oncology [place] at [facility] Phone: [phone] Pager: [phone] Email: [email or web address]

## 2021-12-18 ENCOUNTER — Encounter: Payer: Self-pay | Admitting: *Deleted

## 2021-12-18 LAB — CA 125: Cancer Antigen (CA) 125: 5.8 U/mL

## 2021-12-18 LAB — PROSTATE-SPECIFIC AG, SERUM (LABCORP): Prostate Specific Ag, Serum: 0.5 ng/mL (ref 0.0–4.0)

## 2021-12-18 LAB — TESTOSTERONE: Testosterone: <3 ng/dL — ABNORMAL LOW (ref 264–916)

## 2021-12-21 ENCOUNTER — Ambulatory Visit (HOSPITAL_COMMUNITY)
Admission: RE | Admit: 2021-12-21 | Discharge: 2021-12-21 | Disposition: A | Discharge status: Home / Self Care | Payer: Medicare PPO | Source: Ambulatory Visit | Attending: Physician Assistant | Admitting: Physician Assistant

## 2021-12-21 DIAGNOSIS — I700 Atherosclerosis of aorta: Secondary | ICD-10-CM | POA: Diagnosis not present

## 2021-12-21 DIAGNOSIS — I2510 Atherosclerotic heart disease of native coronary artery without angina pectoris: Secondary | ICD-10-CM | POA: Insufficient documentation

## 2021-12-21 DIAGNOSIS — K449 Diaphragmatic hernia without obstruction or gangrene: Secondary | ICD-10-CM | POA: Insufficient documentation

## 2021-12-21 DIAGNOSIS — J929 Pleural plaque without asbestos: Secondary | ICD-10-CM | POA: Diagnosis not present

## 2021-12-21 DIAGNOSIS — M7989 Other specified soft tissue disorders: Secondary | ICD-10-CM | POA: Diagnosis not present

## 2021-12-21 DIAGNOSIS — J439 Emphysema, unspecified: Secondary | ICD-10-CM | POA: Insufficient documentation

## 2021-12-21 DIAGNOSIS — J432 Centrilobular emphysema: Secondary | ICD-10-CM | POA: Diagnosis not present

## 2021-12-21 DIAGNOSIS — K760 Fatty (change of) liver, not elsewhere classified: Secondary | ICD-10-CM | POA: Diagnosis not present

## 2021-12-21 DIAGNOSIS — Z8546 Personal history of malignant neoplasm of prostate: Secondary | ICD-10-CM | POA: Diagnosis not present

## 2021-12-21 DIAGNOSIS — K5730 Diverticulosis of large intestine without perforation or abscess without bleeding: Secondary | ICD-10-CM | POA: Diagnosis not present

## 2021-12-21 DIAGNOSIS — K439 Ventral hernia without obstruction or gangrene: Secondary | ICD-10-CM | POA: Diagnosis not present

## 2021-12-21 DIAGNOSIS — I771 Stricture of artery: Secondary | ICD-10-CM | POA: Diagnosis not present

## 2021-12-21 MED ORDER — IOHEXOL 300 MG/ML  SOLN
100.0000 mL | Freq: Once | INTRAMUSCULAR | Status: AC | PRN
  Administered 2021-12-21: 100 mL via INTRAVENOUS

## 2021-12-24 ENCOUNTER — Telehealth: Payer: Self-pay | Admitting: Physician Assistant

## 2021-12-24 NOTE — Telephone Encounter (Signed)
I called [name] to review the CT scan results from 12/21/2021. Findings are concerning for soft tissue mass arising from ascending colon. He is scheduled for a follow up with GI tomorrow. We will discuss arranging colonoscopy for further evaluation. Patient's labs were unremarkable except for mild anemia that has been stable for over one year. Patient expressed understanding of the plan provided.

## 2021-12-24 NOTE — Telephone Encounter (Signed)
Error

## 2021-12-25 ENCOUNTER — Ambulatory Visit: Payer: Medicare PPO | Admitting: Gastroenterology

## 2021-12-25 ENCOUNTER — Encounter: Payer: Self-pay | Admitting: Gastroenterology

## 2021-12-25 VITALS — BP 120/68 | HR 75

## 2021-12-25 DIAGNOSIS — R933 Abnormal findings on diagnostic imaging of other parts of digestive tract: Secondary | ICD-10-CM

## 2021-12-25 NOTE — Patient Instructions (Signed)
If you are age 74 or older, your body mass index should be between 23-30. Your There is no height or weight on file to calculate BMI. If this is out of the aforementioned range listed, please consider follow up with your Primary Care Provider.  If you are age 29 or younger, your body mass index should be between 19-25. Your There is no height or weight on file to calculate BMI. If this is out of the aformentioned range listed, please consider follow up with your Primary Care Provider.   ________________________________________________________  The  [facility] providers would like to encourage you to use [facility] to communicate with providers for non-urgent requests or questions.  Due to long hold times on the telephone, sending your provider a message by [facility] may be a faster and more efficient way to get a response.  Please allow 48 business hours for a response.  Please remember that this is for non-urgent requests.   You have been scheduled for a colonoscopy. Please follow written instructions given to you at your visit today.  Please pick up your prep supplies at the pharmacy within the next 1-3 days. If you use inhalers (even only as needed), please bring them with you on the day of your procedure.   Due to recent changes in healthcare laws, you may see the results of your imaging and laboratory studies on MyChart before your provider has had a chance to review them.  We understand that in some cases there may be results that are confusing or concerning to you. Not all laboratory results come back in the same time frame and the provider may be waiting for multiple results in order to interpret others.  Please give [place] 48 hours in order for your provider to thoroughly review all the results before contacting the office for clarification of your results.    Thank you for entrusting me with your care and choosing [facility].  Alonza Bogus PA-C

## 2021-12-25 NOTE — Progress Notes (Signed)
12/25/2021 Joseph Golden [id number] [birth year]   HISTORY OF PRESENT ILLNESS: This is a pleasant 76 year old male is a patient of Dr. Celesta Aver.  He has been referred here on this occasion for evaluation of abnormal CT scan.  He has a history of prostate cancer.  He tells me that he feels well.  He moves his bowels well.  He denies any rectal bleeding.  He denies any abdominal pain.  CT scan chest/abdomen/pelvis:  IMPRESSION: 1. Redemonstration of soft tissue density lesion intimately associated with the medial wall of the ascending colon. Based on today's exam, favored etiologies include colon cancer with direct transmural extension and/or peritoneal metastasis. Differential considerations include atypical appearance of lymphoma or prostate cancer metastasis. Of note, a smaller, subtle abnormality was present on 06/10/2020 PET, arguing against an aggressive histology. Consider colonoscopy with special attention to the ascending colon versus percutaneous or operative tissue sampling tissue sampling. 2. No other sites of primary malignancy or metastatic disease identified. 3. New right and progressive left lower lobe peribronchovascular ground-glass and nodularity, suspicious for infection or aspiration. 4. Large hiatal hernia which could predispose the patient to aspiration. 5. Similar nonspecific tiny pulmonary nodules. 6. Incidental findings, including: Aortic atherosclerosis (ICD10-I70.0), coronary artery atherosclerosis and emphysema (ICD10-J43.9). Hepatic steatosis.   Colonoscopy 05/2017:  - Diverticulosis in the sigmoid colon. - Medium-sized lipoma in the ascending colon. - The examination was otherwise normal on direct and retroflexion views. - No specimens collected. - Personal history of colonic polyps. 12 mm Adenoma w/ high-grade dysplasia 2013  Past Medical History:  Diagnosis Date   Allergy    SEASONAL   Aortic atherosclerosis ([place])    Diverticulosis  76/72/0947   Helicobacter pylori gastritis 04/23/2013   Hepatic steatosis    Hiatal hernia    large   HTN (hypertension)    Iron deficiency anemia secondary to blood loss (chronic) - Hiatal hernia with Lysbeth Galas erosions 03/01/2013   Other and unspecified hyperlipidemia 10/31/2012   Personal history of colonic adenoma 12/30/2011   12/2011 - 12 mm adenoma with high-grade dysplasia   Prostate cancer metastatic to bone ([place]) 02/2017   Past Surgical History:  Procedure Laterality Date   COLONOSCOPY  2013   LYMPHADENECTOMY Bilateral 06/08/2017   Procedure: LYMPHADENECTOMY;  Surgeon: Lucas Mallow, MD;  Location: [facility];  Service: Urology;  Laterality: Bilateral;   NO PAST SURGERIES     PROSTATE BIOPSY     ROBOT ASSISTED LAPAROSCOPIC RADICAL PROSTATECTOMY N/A 06/08/2017   Procedure: XI ROBOTIC ASSISTED LAPAROSCOPIC RADICAL PROSTATECTOMY;  Surgeon: Lucas Mallow, MD;  Location: [facility];  Service: Urology;  Laterality: N/A;    reports that he quit smoking about 16 years ago. His smoking use included pipe. He has never used smokeless tobacco. He reports current alcohol use of about 3.0 standard drinks of alcohol per week. He reports that he does not use drugs. family history includes Brain cancer (age of onset: 77) in his father; Breast cancer in his mother; Hypertension in his brother, mother, and sister; Stomach cancer in an other family member. No Known Allergies    Outpatient Encounter Medications as of 12/25/2021  Medication Sig   amLODipine (NORVASC) 10 MG tablet Take 1 tablet (10 mg total) by mouth daily.   atorvastatin (LIPITOR) 40 MG tablet Take 1 tablet (40 mg total) by mouth at bedtime.   carvedilol (COREG) 6.25 MG tablet Take 1 tablet (6.25 mg total) by mouth 2 (two) times daily with a meal.  losartan-hydrochlorothiazide (HYZAAR) 50-12.5 MG tablet TAKE 1 TABLET BY MOUTH EVERY DAY   No facility-administered encounter medications on file as of 12/25/2021.     REVIEW OF  SYSTEMS  : All other systems reviewed and negative except where noted in the History of Present Illness.   PHYSICAL EXAM: BP 120/68   Pulse 75   SpO2 94%  General: Well developed AA male in no acute distress Head: Normocephalic and atraumatic Eyes:  Sclerae anicteric, conjunctiva pink. Ears: Normal auditory acuity Lungs: Clear throughout to auscultation; no W/R/R. Heart: Regular rate and rhythm; no M/R/G. Abdomen: Soft, non-distended.  BS present.  Non-tender. Rectal:  Will be done at the time of colonoscopy. Musculoskeletal: Symmetrical with no gross deformities  Skin: No lesions on visible extremities Extremities: No edema  Neurological: Alert oriented x 4, grossly non-focal Psychological:  Alert and cooperative. Normal mood and affect  ASSESSMENT AND PLAN: *Abnormal CT scan showing possible concern in the right/ascending colon.  Has a history of prostate cancer.  Of note, he had colonoscopy 2019 and had a medium sized lipoma in the ascending colon at that time.  Wonder if that could be what they are seeing on the scan.  Nonetheless, we will plan for colonoscopy with Dr. Carlean Purl. The risks, benefits, and alternatives were discussed with the patient and he consents to proceed.   CC:  Colon Branch, MD

## 2021-12-28 ENCOUNTER — Encounter: Payer: Self-pay | Admitting: Internal Medicine

## 2021-12-31 NOTE — Progress Notes (Signed)
I spoke with Mr Mcclintock and briefly explained my role at the cancer center.  He has agreed to a f/u appt with Dede Query PA-C on 1/3 at 1000 arrive at 48 to review colonoscopy report and next steps.  All questions were answered.  He verbalized understanding.

## 2022-01-06 ENCOUNTER — Encounter: Payer: Self-pay | Admitting: Internal Medicine

## 2022-01-06 ENCOUNTER — Ambulatory Visit (AMBULATORY_SURGERY_CENTER): Payer: Medicare PPO | Admitting: Internal Medicine

## 2022-01-06 VITALS — BP 117/75 | HR 64 | Temp 96.8°F | Resp 14 | Ht 70.5 in | Wt 206.0 lb

## 2022-01-06 DIAGNOSIS — K5730 Diverticulosis of large intestine without perforation or abscess without bleeding: Secondary | ICD-10-CM | POA: Diagnosis not present

## 2022-01-06 DIAGNOSIS — Z86010 Personal history of colonic polyps: Secondary | ICD-10-CM

## 2022-01-06 DIAGNOSIS — R933 Abnormal findings on diagnostic imaging of other parts of digestive tract: Secondary | ICD-10-CM

## 2022-01-06 DIAGNOSIS — D175 Benign lipomatous neoplasm of intra-abdominal organs: Secondary | ICD-10-CM | POA: Diagnosis not present

## 2022-01-06 MED ORDER — SODIUM CHLORIDE 0.9 % IV SOLN: 500.0000 mL | INTRAVENOUS | Status: DC

## 2022-01-06 NOTE — Progress Notes (Signed)
History and Physical Interval Note:  01/06/2022 3:58 PM  Joseph Golden  has presented today for endoscopic procedure(s), with the diagnosis of  Encounter Diagnosis  Name Primary?   Abnormal CT scan, colon Yes  .  The various methods of evaluation and treatment have been discussed with the patient and/or family. After consideration of risks, benefits and other options for treatment, the patient has consented to  the endoscopic procedure(s).   The patient's history has been reviewed, patient examined, no change in status, stable for endoscopic procedure(s).  I have reviewed the patient's chart and labs.  Questions were answered to the patient's satisfaction.     Gatha Mayer, MD, [facility]

## 2022-01-06 NOTE — Patient Instructions (Addendum)
I did not find any signs of cancer in the colon. 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  Also have swollen hemorrhoids and diverticulosis.  Please resume your normal diet and medications.  You will not need any additional routine colonoscopy testing given no polyps and current age. If you have problems that indicate a need then would consider repeating one.  I appreciate the opportunity to care for you. Gatha Mayer, MD, FACG   YOU HAD AN ENDOSCOPIC PROCEDURE TODAY AT [facility]:   Refer to the procedure report that was given to you for any specific questions about what was found during the examination.  If the procedure report does not answer your questions, please call your gastroenterologist to clarify.  If you requested that your care partner not be given the details of your procedure findings, then the procedure report has been included in a sealed envelope for you to review at your convenience later.  YOU SHOULD EXPECT: Some feelings of bloating in the abdomen. Passage of more gas than usual.  Walking can help get rid of the air that was put into your GI tract during the procedure and reduce the bloating. If you had a lower endoscopy (such as a colonoscopy or flexible sigmoidoscopy) you may notice spotting of blood in your stool or on the toilet paper. If you underwent a bowel prep for your procedure, you may not have a normal bowel movement for a few days.  Please Note:  You might notice some irritation and congestion in your nose or some drainage.  This is from the oxygen used during your procedure.  There is no need for concern and it should clear up in a day or so.  SYMPTOMS TO REPORT IMMEDIATELY:  Following lower endoscopy (colonoscopy or flexible sigmoidoscopy):  Excessive amounts of blood in the stool  Significant tenderness or worsening of abdominal pains  Swelling of the abdomen  that is new, acute  Fever of 100F or higher  For urgent or emergent issues, a gastroenterologist can be reached at any hour by calling [phone]. Do not use MyChart messaging for urgent concerns.    DIET:  We do recommend a small meal at first, but then you may proceed to your regular diet.  Drink plenty of fluids but you should avoid alcoholic beverages for 24 hours.  ACTIVITY:  You should plan to take it easy for the rest of today and you should NOT DRIVE or use heavy machinery until tomorrow (because of the sedation medicines used during the test).    FOLLOW UP: Our staff will call the number listed on your records the next business day following your procedure.  We will call around 7:15- 8:00 am to check on you and address any questions or concerns that you may have regarding the information given to you following your procedure. If we do not reach you, we will leave a message.      SIGNATURES/CONFIDENTIALITY: You and/or your care partner have signed paperwork which will be entered into your electronic medical record.  These signatures attest to the fact that that the information above on your After Visit Summary has been reviewed and is understood.  Full responsibility of the confidentiality of this discharge information lies with you and/or your care-partner.

## 2022-01-06 NOTE — Progress Notes (Signed)
Pt's states no medical or surgical changes since previsit or office visit. 

## 2022-01-06 NOTE — Op Note (Signed)
[facility] Patient Name: Joseph Golden Procedure Date: 01/06/2022 3:58 PM MRN: [id number] Endoscopist: Gatha Mayer , MD, [id number] Age: 76 Referring MD:  Date of Birth: 11-15-1945 Gender: Male Account #: [id number] Procedure:                Colonoscopy Indications:              Abnormal CT of the GI tract + CT PET with soft                            tissue mass in ascending colon in setting of                            metastatic prostate cancer. Lipoma seen in this                            area 2019 colonoscopy Medicines:                Monitored Anesthesia Care Procedure:                Pre-Anesthesia Assessment:                           - Prior to the procedure, a History and Physical                            was performed, and patient medications and                            allergies were reviewed. The patient's tolerance of                            previous anesthesia was also reviewed. The risks                            and benefits of the procedure and the sedation                            options and risks were discussed with the patient.                            All questions were answered, and informed consent                            was obtained. Prior Anticoagulants: The patient has                            taken no anticoagulant or antiplatelet agents. ASA                            Grade Assessment: II - A patient with mild systemic                            disease. After reviewing the risks and benefits,  the patient was deemed in satisfactory condition to                            undergo the procedure.                           After obtaining informed consent, the colonoscope                            was passed under direct vision. Throughout the                            procedure, the patient's blood pressure, pulse, and                            oxygen saturations were monitored continuously.  The                            CF [id number] #[id number] was introduced through the anus                            and advanced to the the cecum, identified by                            appendiceal orifice and ileocecal valve. The                            colonoscopy was performed without difficulty. The                            patient tolerated the procedure well. The quality                            of the bowel preparation was good. The bowel                            preparation used was Miralax via split dose                            instruction. The ileocecal valve, appendiceal                            orifice, and rectum were photographed. Scope In: 4:05:25 PM Scope Out: 4:19:40 PM Scope Withdrawal Time: 0 hours 10 minutes 56 seconds  Total Procedure Duration: 0 hours 14 minutes 15 seconds  Findings:                 The perianal and digital rectal examinations were                            normal.                           There was a lipoma, in the ascending colon.  Multiple diverticula were found in the sigmoid                            colon.                           External and internal hemorrhoids were found.                           The exam was otherwise without abnormality on                            direct and retroflexion views. Complications:            No immediate complications. Estimated Blood Loss:     Estimated blood loss: none. 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 same area as was                            seen in 2019 - appearance less classic but                            submocoasal soft mass (+pillow sign though not                            photiographed), appears to have 2 or more lobes. No                            signs of malignancy.                           - Diverticulosis in the sigmoid colon.                           - External and internal hemorrhoids.                           -  The examination was otherwise normal on direct                            and retroflexion views.                           - No specimens collected. Recommendation:           - Patient has a contact number available for                            emergencies. The signs and symptoms of potential                            delayed complications were discussed with the                            patient. Return to normal activities tomorrow.  Written discharge instructions were provided to the                            patient.                           - Resume previous diet.                           - Continue present medications.                           - No repeat colonoscopy due to age and the absence                            of colonic polyps. Gatha Mayer, MD 01/06/2022 4:37:19 PM This report has been signed electronically.

## 2022-01-06 NOTE — Progress Notes (Unsigned)
A and O x3. Report to RN. Tolerated MAC anesthesia well. 

## 2022-01-07 ENCOUNTER — Telehealth: Payer: Self-pay

## 2022-01-07 NOTE — Telephone Encounter (Signed)
  Follow up Call-     01/06/2022    3:00 PM  Call back number  Post procedure Call Back phone  # [phone]  Permission to leave phone message Yes     Patient questions:  Do you have a fever, pain , or abdominal swelling? No. Pain Score  0 *  Have you tolerated food without any problems? Yes.    Have you been able to return to your normal activities? Yes.    Do you have any questions about your discharge instructions: Diet   No. Medications  No. Follow up visit  No.  Do you have questions or concerns about your Care? No.  Actions: * If pain score is 4 or above: No action needed, pain <4.

## 2022-01-13 ENCOUNTER — Inpatient Hospital Stay: Payer: Medicare PPO | Attending: Physician Assistant | Admitting: Physician Assistant

## 2022-01-13 ENCOUNTER — Telehealth: Payer: Self-pay | Admitting: Hematology and Oncology

## 2022-01-13 VITALS — BP 117/71 | HR 74 | Temp 97.5°F | Resp 16 | Wt 205.2 lb

## 2022-01-13 DIAGNOSIS — Z800 Family history of malignant neoplasm of digestive organs: Secondary | ICD-10-CM | POA: Insufficient documentation

## 2022-01-13 DIAGNOSIS — Z9079 Acquired absence of other genital organ(s): Secondary | ICD-10-CM | POA: Insufficient documentation

## 2022-01-13 DIAGNOSIS — Z7289 Other problems related to lifestyle: Secondary | ICD-10-CM | POA: Insufficient documentation

## 2022-01-13 DIAGNOSIS — F17290 Nicotine dependence, other tobacco product, uncomplicated: Secondary | ICD-10-CM | POA: Insufficient documentation

## 2022-01-13 DIAGNOSIS — Z803 Family history of malignant neoplasm of breast: Secondary | ICD-10-CM | POA: Diagnosis not present

## 2022-01-13 DIAGNOSIS — Z8546 Personal history of malignant neoplasm of prostate: Secondary | ICD-10-CM | POA: Insufficient documentation

## 2022-01-13 DIAGNOSIS — M7989 Other specified soft tissue disorders: Secondary | ICD-10-CM

## 2022-01-13 DIAGNOSIS — E785 Hyperlipidemia, unspecified: Secondary | ICD-10-CM | POA: Insufficient documentation

## 2022-01-13 DIAGNOSIS — I10 Essential (primary) hypertension: Secondary | ICD-10-CM | POA: Insufficient documentation

## 2022-01-13 DIAGNOSIS — Z808 Family history of malignant neoplasm of other organs or systems: Secondary | ICD-10-CM | POA: Insufficient documentation

## 2022-01-13 DIAGNOSIS — Z8249 Family history of ischemic heart disease and other diseases of the circulatory system: Secondary | ICD-10-CM | POA: Insufficient documentation

## 2022-01-13 DIAGNOSIS — Z79899 Other long term (current) drug therapy: Secondary | ICD-10-CM | POA: Diagnosis not present

## 2022-01-13 DIAGNOSIS — R1900 Intra-abdominal and pelvic swelling, mass and lump, unspecified site: Secondary | ICD-10-CM | POA: Insufficient documentation

## 2022-01-13 NOTE — Telephone Encounter (Signed)
Called patient to schedule f/u. Patient notified.  

## 2022-01-13 NOTE — Progress Notes (Signed)
[place] Telephone:(336) [phone]   [phone]  PROGRESS NOTE:  Patient Care Team: Colon Branch, MD as PCP - General (Internal Medicine) Gatha Mayer, MD as Consulting Physician (Gastroenterology) Lucas Mallow, MD as Consulting Physician (Urology) Tyler Pita, MD as Consulting Physician (Radiation Oncology) Delice Bison, Charlestine Massed, NP as Nurse Practitioner (Hematology and Oncology) Harmon Pier, RN as Registered Nurse  CHIEF COMPLAINTS/PURPOSE OF CONSULTATION:  Soft tissue density/mass in the abdomen  DIAGNOSTIC WORKUP: 12/04/2021: PET PSMA scan: mild to moderately PSMA avid soft tissue mass along the medial margin of the ascending colon adjacent to the hepatic flexure. 12/21/2021: CT CAP:  Redemonstration of soft tissue density lesion intimately associated with the medial wall of the ascending colon. 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 Biopsies not obtained.    INTERVAL HISTORY: Joseph Golden returns for a follow up to review the diagnostic workup for the soft tissue mass near the ascending colon. He is unaccompanied for this visit.   Joseph Golden reports that he continues to feel well without any new or concerning symptoms. His appetite and weight are stable.  He denies nausea, vomiting or abdominal pain. His bowel habits are unchanged. He denies any easy bruising or signs of bleeding. He denies fevers, chills, night sweats, shortness of breath, chest pain or cough. He has no other complaints. Rest of the 10 point ROS is below.   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    SURGICAL HISTORY: Past Surgical History:  Procedure Laterality Date   COLONOSCOPY  2013   LYMPHADENECTOMY Bilateral 06/08/2017   Procedure: LYMPHADENECTOMY;  Surgeon: Lucas Mallow, MD;  Location: [facility];  Service: Urology;  Laterality: Bilateral;   NO PAST SURGERIES     PROSTATE BIOPSY     ROBOT ASSISTED LAPAROSCOPIC RADICAL PROSTATECTOMY N/A 06/08/2017   Procedure: XI ROBOTIC ASSISTED LAPAROSCOPIC RADICAL PROSTATECTOMY;  Surgeon: Lucas Mallow, MD;  Location: [facility];  Service: Urology;  Laterality: N/A;    SOCIAL HISTORY: Social History   Socioeconomic History   Marital status: Married    Spouse name: Not on file   Number of children: 5   Years of education: Not on file   Highest education level: Not on file  Occupational History   Occupation: retired   Tobacco Use   Smoking status: Former    Types: Pipe    Quit date: 2007    Years since quitting: 17.0   Smokeless tobacco: Never   Tobacco comments:    quit ~ 2005, smoked pipe   Vaping Use   Vaping Use: Never used  Substance and Sexual Activity   Alcohol use: Yes    Alcohol/week: 3.0 standard drinks of alcohol    Types: 2 Cans of beer, 1 Shots of liquor per week    Comment: social   Drug use: No   Sexual  activity: Not Currently  Other Topics Concern   Not on file  Social History Narrative   Married for 50  years. Lives in [place]. One son and four daughters.   Household: wife, 1 daughters and 1 g-son   Social Determinants of Health   Financial Resource Strain: Low Risk  (12/22/2020)   Overall Financial Resource Strain (CARDIA)    Difficulty of Paying Living Expenses: Not very hard  Food Insecurity: No Food Insecurity (12/22/2020)   Hunger Vital Sign    Worried About  Running Out of Food in the Last Year: Never true    Ran Out of Food in the Last Year: Never true  Transportation Needs: No Transportation Needs (12/22/2020)   PRAPARE - Hydrologist (Medical): No    Lack of Transportation (Non-Medical): No  Physical Activity: Sufficiently Active (12/22/2020)   Exercise Vital Sign    Days of Exercise per Week: 6 days    Minutes of Exercise per Session: 60 min  Stress: No Stress Concern Present (12/22/2020)   [place]    Feeling of Stress : Not at all  Social Connections: Moderately Integrated (12/22/2020)   Social Connection and Isolation Panel [NHANES]    Frequency of Communication with Friends and Family: More than three times a week    Frequency of Social Gatherings with Friends and Family: Three times a week    Attends Religious Services: 1 to 4 times per year    Active Member of Clubs or Organizations: No    Attends Archivist Meetings: Never    Marital Status: Married  Human resources officer Violence: Not At Risk (12/22/2020)   Humiliation, Afraid, Rape, and Kick questionnaire    Fear of Current or Ex-Partner: No    Emotionally Abused: No    Physically Abused: No    Sexually Abused: No    FAMILY HISTORY: Family History  Problem Relation Age of Onset   Hypertension Mother    Breast cancer Mother    Brain cancer Father 60   Hypertension Sister    Hypertension Brother    Stomach cancer Other    Colon cancer Neg Hx    Esophageal cancer Neg Hx    Rectal cancer Neg Hx    Prostate cancer Neg Hx    Diabetes Neg Hx    CAD Neg Hx     ALLERGIES:  has No Known Allergies.  MEDICATIONS:  Current Outpatient Medications  Medication Sig Dispense Refill   amLODipine (NORVASC) 10 MG tablet Take 1 tablet (10 mg total) by mouth daily. 90 tablet 1   atorvastatin (LIPITOR) 40 MG tablet Take 1 tablet (40 mg total) by mouth at bedtime. 90 tablet 1    carvedilol (COREG) 6.25 MG tablet Take 1 tablet (6.25 mg total) by mouth 2 (two) times daily with a meal. 180 tablet 1   losartan-hydrochlorothiazide (HYZAAR) 50-12.5 MG tablet TAKE 1 TABLET BY MOUTH EVERY DAY 90 tablet 1   No current facility-administered medications for this visit.    REVIEW OF SYSTEMS:   Constitutional: ( - ) fevers, ( - )  chills , ( - ) night sweats Eyes: ( - ) blurriness of vision, ( - ) double vision, ( - ) watery eyes Ears, nose, mouth, throat, and face: ( - ) mucositis, ( - ) sore throat Respiratory: ( - ) cough, ( - ) dyspnea, ( - ) wheezes Cardiovascular: ( - ) palpitation, ( - )  chest discomfort, ( - ) lower extremity swelling Gastrointestinal:  ( - ) nausea, ( - ) heartburn, ( - ) change in bowel habits Skin: ( - ) abnormal skin rashes Lymphatics: ( - ) new lymphadenopathy, ( - ) easy bruising Neurological: ( - ) numbness, ( - ) tingling, ( - ) new weaknesses Behavioral/Psych: ( - ) mood change, ( - ) new changes  All other systems were reviewed with the patient and are negative.  PHYSICAL EXAMINATION: ECOG PERFORMANCE STATUS: 0 - Asymptomatic  Vitals:   01/13/22 0959  BP: 117/71  Pulse: 74  Resp: 16  Temp: (!) 97.5 F (36.4 C)  SpO2: 100%   Filed Weights   01/13/22 0959  Weight: 205 lb 4 oz (93.1 kg)    GENERAL: well appearing male in NAD  SKIN: skin color, texture, turgor are normal, no rashes or significant lesions EYES: conjunctiva are pink and non-injected, sclera clear LUNGS: clear to auscultation and percussion with normal breathing effort HEART: regular rate & rhythm and no murmurs and no lower extremity edema Musculoskeletal: no cyanosis of digits and no clubbing  PSYCH: alert & oriented x 3, fluent speech NEURO: no focal motor/sensory deficits  LABORATORY DATA:  I have reviewed the data as listed    Latest Ref Rng & Units 12/16/2021    1:07 PM 12/07/2021   10:51 AM 06/09/2021    9:53 AM  CBC  WBC 4.0 - 10.5 K/uL 5.6  4.9  4.5    Hemoglobin 13.0 - 17.0 g/dL 12.3  12.2  12.2   Hematocrit 39.0 - 52.0 % 36.6  36.4  36.5   Platelets 150 - 400 K/uL 236  225.0  213.0        Latest Ref Rng & Units 12/16/2021    1:07 PM 12/07/2021   10:51 AM 06/09/2021    9:53 AM  CMP  Glucose 70 - 99 mg/dL 144  106  110   BUN 8 - 23 mg/dL '16  13  15   '$ Creatinine 0.61 - 1.24 mg/dL 1.26  0.87  0.94   Sodium 135 - 145 mmol/L 140  136  140   Potassium 3.5 - 5.1 mmol/L 3.8  4.0  4.3   Chloride 98 - 111 mmol/L 105  101  104   CO2 22 - 32 mmol/L '28  28  28   '$ Calcium 8.9 - 10.3 mg/dL 9.9  9.6  9.9   Total Protein 6.5 - 8.1 g/dL 7.7   7.0   Total Bilirubin 0.3 - 1.2 mg/dL 0.4   0.6   Alkaline Phos 38 - 126 U/L 93   83   AST 15 - 41 U/L 17   18   ALT 0 - 44 U/L 18   20      RADIOGRAPHIC STUDIES: I have personally reviewed the radiological images as listed and agreed with the findings in the report. CT CHEST ABDOMEN PELVIS W CONTRAST  Result Date: 12/22/2021 CLINICAL DATA:  History of metastatic prostate cancer. Abnormality within the right side of the abdomen on PSMA PET. * Tracking Code: BO * EXAM: CT CHEST, ABDOMEN, AND PELVIS WITH CONTRAST TECHNIQUE: Multidetector CT imaging of the chest, abdomen and pelvis was performed following the standard protocol during bolus administration of intravenous contrast. RADIATION DOSE REDUCTION: This exam was performed according to the departmental dose-optimization program which includes automated exposure control, adjustment of the mA and/or kV according to patient size and/or use of iterative reconstruction technique. CONTRAST:  116m  OMNIPAQUE IOHEXOL 300 MG/ML  SOLN COMPARISON:  Clarify PET of 12/04/2021. Chest CTs including 09/02/2021. No prior diagnostic abdominopelvic CT. FINDINGS: CT CHEST FINDINGS Cardiovascular: Aortic atherosclerosis. Tortuous thoracic aorta. Mild cardiomegaly, without pericardial effusion. No central pulmonary embolism, on this non-dedicated study. Mediastinum/Nodes: No  supraclavicular adenopathy. No mediastinal or hilar adenopathy. Large hiatal hernia again identified with a component of organoaxial position/rotation. Fluid again identified adjacent the herniated stomach. No specific findings to suggest obstruction or complicating ischemia. Lungs/Pleura: No pleural fluid. Mild centrilobular and paraseptal emphysema. A 2 mm right upper lobe pulmonary nodule on 31/4 is similar to 09/02/2021. Linear right upper lobe 2-3 mm nodule described on that exam is similar today on 55/4. Bibasilar scarring. 3 mm superior segment right lower lobe pulmonary nodule on 72/4 is unchanged. New right lower lobe areas of subtle peribronchovascular ground-glass including on 75/4 and nodular interstitial thickening including at 1.4 cm on 96/4. Similar areas of peribronchovascular interstitial thickening and ground-glass nodularity are progressive in the superior segment left lower lobe included on 70/4. New or progressive Musculoskeletal: No acute osseous abnormality. Treated metastasis within bilateral upper ribs were not PSMA PET avid. CT ABDOMEN PELVIS FINDINGS Hepatobiliary: Hepatic steatosis. 5.5 cm right hepatic lobe cyst. Smaller fluid density lesions are also favored to represent cysts and were present in 2022. Normal gallbladder, without biliary ductal dilatation. Pancreas: Normal, without mass or ductal dilatation. Spleen: Normal in size, without focal abnormality. Adrenals/Urinary Tract: Normal adrenal glands. Too small to characterize lesions in both kidneys . In the absence of clinically indicated signs/symptoms require(s) no independent follow-up. No hydronephrosis. The bladder is decompressed. Stomach/Bowel: Normal distal most stomach. Extensive colonic diverticulosis. No dominant colonic mass identified. Normal terminal ileum and appendix. Normal small bowel. Vascular/Lymphatic: Aortic atherosclerosis. No abdominal retroperitoneal or pelvic sidewall adenopathy. Reproductive:  Prostatectomy, without local recurrence. Other: No free intraperitoneal air.  No significant free fluid. Corresponding to the abnormality on prior PET is an irregular soft tissue density lesion of 2.5 x 1.9 cm intimately associated with the medial wall of the ascending colon, just cephalad to the ileocecal junction. Example 79/2. No other peritoneal or omental nodules. No other mesenteric adenopathy. Musculoskeletal: Lucent lesions within the L3 and L4 vertebral bodies are favored to represent hemangiomas and were present on 06/10/2020 PET. Example at L3 at 1.5 cm, eccentric right. IMPRESSION: 1. Redemonstration of soft tissue density lesion intimately associated with the medial wall of the ascending colon. Based on today's exam, favored etiologies include colon cancer with direct transmural extension and/or peritoneal metastasis. Differential considerations include atypical appearance of lymphoma or prostate cancer metastasis. Of note, a smaller, subtle abnormality was present on 06/10/2020 PET, arguing against an aggressive histology. Consider colonoscopy with special attention to the ascending colon versus percutaneous or operative tissue sampling tissue sampling. 2. No other sites of primary malignancy or metastatic disease identified. 3. New right and progressive left lower lobe peribronchovascular ground-glass and nodularity, suspicious for infection or aspiration. 4. Large hiatal hernia which could predispose the patient to aspiration. 5. Similar nonspecific tiny pulmonary nodules. 6. Incidental findings, including: Aortic atherosclerosis (ICD10-I70.0), coronary artery atherosclerosis and emphysema (ICD10-J43.9). Hepatic steatosis. Electronically Signed   By: Abigail Miyamoto M.D.   On: 12/22/2021 16:43    ASSESSMENT & PLAN Joseph Golden is a 77 y.o. male who presents to the diagnostic clinic due to recent PET imaging concerning for malignancy. We reviewed the PET imaging and the suspicious soft tissue lesion  near the ascending colon. We recommend patient proceed with  CT CAP with contrast to further evaluate the lesion in question. We discussed the need for tissue biopsy. If there are no other targetable lesions identified with upcoming CT scan, we will refer patient to general surgery to evaluate for surgical biopsy.     #Abdominal soft tissue density/mass--most consistent with lipoma: --Workup includes CT CAP from 12/21/2021 that redemonstrated soft tissue lesion near the ascending colon. 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  --CEA, CA125 and PSA tumor markers were normal --Discussed surveillance versus pursuing further workup including percutaneous versus surgical biopsy. We decided to monitor for now and repeat imaging in 3-4 months to confirm stability. --RTC in 4 months to see Dr. Lorenso Courier for CT review.   #H/O prostate cancer with oligometastatic disease involving 3 ribs: --Under the care of Urologist, Dr. Link Snuffer.  --Underwent robotic radical prostatectomy on 5/29/20219. Pathology showed prostatic adenocarcinoma, gleason score 4 + 5 = 9. 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 Lymph nodes negative for malignancy. Pathologic stage pT3bN0.  --Received adjuvant radiation to prostate, seminal vesicles, and pelvic lymph nodes from 11/09/2017-01/02/2018 --Underwent radiation to  left third, left fifth and right second ribs from 08/20/2020-08/29/2020.  --Currently receives ADT therapy.  --Labs from 11/03/2021 showed PSA 0.32 and testosterone <10.    Orders Placed This Encounter  Procedures   CT Abdomen Pelvis W Contrast    Standing Status:   Future    Standing Expiration Date:   01/13/2023    Order Specific Question:   If indicated for the ordered procedure, I authorize the administration of contrast media per Radiology protocol    Answer:   Yes    Order Specific Question:   Does the patient have a contrast media/X-ray  dye allergy?    Answer:   Yes    Order Specific Question:   Preferred imaging location?    Answer:   [facility]    Order Specific Question:   Is Oral Contrast requested for this exam?    Answer:   Yes, Per Radiology protocol    All questions were answered. The patient knows to call the clinic with any problems, questions or concerns.  I have spent a total of 25 minutes minutes of face-to-face and non-face-to-face time, preparing to see the [place] a medically appropriate examination, counseling and educating the patient, ordering tests/procedures, documenting clinical information in the electronic health record and care coordination.   Dede Query, PA-C Department of Hematology/Oncology [place] at [facility] Phone: [phone]

## 2022-02-03 DIAGNOSIS — C61 Malignant neoplasm of prostate: Secondary | ICD-10-CM | POA: Diagnosis not present

## 2022-02-11 DIAGNOSIS — C61 Malignant neoplasm of prostate: Secondary | ICD-10-CM | POA: Diagnosis not present

## 2022-02-11 DIAGNOSIS — C7951 Secondary malignant neoplasm of bone: Secondary | ICD-10-CM | POA: Diagnosis not present

## 2022-02-17 DIAGNOSIS — C61 Malignant neoplasm of prostate: Secondary | ICD-10-CM | POA: Diagnosis not present

## 2022-02-19 ENCOUNTER — Telehealth: Payer: Self-pay | Admitting: Hematology and Oncology

## 2022-02-19 NOTE — Telephone Encounter (Signed)
Per Dorsey's schedule rescheduled pt ; pt aware and confirmed. Mailing calendar

## 2022-03-03 ENCOUNTER — Other Ambulatory Visit: Payer: Self-pay | Admitting: Internal Medicine

## 2022-04-07 ENCOUNTER — Encounter: Payer: Self-pay | Admitting: Internal Medicine

## 2022-04-07 ENCOUNTER — Ambulatory Visit: Payer: Medicare PPO | Admitting: Internal Medicine

## 2022-04-07 VITALS — BP 128/84 | HR 69 | Temp 98.1°F | Resp 16 | Ht 70.5 in | Wt 211.2 lb

## 2022-04-07 DIAGNOSIS — I10 Essential (primary) hypertension: Secondary | ICD-10-CM

## 2022-04-07 DIAGNOSIS — E119 Type 2 diabetes mellitus without complications: Secondary | ICD-10-CM

## 2022-04-07 DIAGNOSIS — E785 Hyperlipidemia, unspecified: Secondary | ICD-10-CM

## 2022-04-07 LAB — BASIC METABOLIC PANEL
BUN: 13 mg/dL (ref 6–23)
CO2: 27 mEq/L (ref 19–32)
Calcium: 9.5 mg/dL (ref 8.4–10.5)
Chloride: 105 mEq/L (ref 96–112)
Creatinine, Ser: 0.82 mg/dL (ref 0.40–1.50)
GFR: 86.42 mL/min (ref >60.00)
Glucose, Bld: 117 mg/dL — ABNORMAL HIGH (ref 70–99)
Potassium: 4.3 mEq/L (ref 3.5–5.1)
Sodium: 138 mEq/L (ref 135–145)

## 2022-04-07 LAB — MICROALBUMIN / CREATININE URINE RATIO
Creatinine,U: 58.5 mg/dL
Microalb Creat Ratio: 1.2 mg/g (ref 0.0–30.0)
Microalb, Ur: <0.7 mg/dL (ref 0.0–1.9)

## 2022-04-07 LAB — HEMOGLOBIN A1C: Hgb A1c MFr Bld: 6.3 % (ref 4.6–6.5)

## 2022-04-07 MED ORDER — ATORVASTATIN CALCIUM 40 MG PO TABS: 40.0000 mg | ORAL_TABLET | Freq: Every day | ORAL | 1 refills | Status: DC

## 2022-04-07 NOTE — Progress Notes (Signed)
Subjective:    Patient ID: Joseph Golden, male    DOB: Apr 23, 1945, 77 y.o.   MRN: [id number]  DOS:  04/07/2022 Type of visit - description: Follow-up  Since the last office visit is actually doing well. Is concerned because some weight gain at home despite eating smaller portions. Ambulatory BPs WNL. Denies heavy EtOH.   Review of Systems Denies nausea vomiting.  No diarrhea or blood in the stools.  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    Past Surgical History:  Procedure Laterality Date   COLONOSCOPY  2013   LYMPHADENECTOMY Bilateral 06/08/2017   Procedure: LYMPHADENECTOMY;  Surgeon: Lucas Mallow, MD;  Location: [facility];  Service: Urology;  Laterality: Bilateral;   NO PAST SURGERIES     PROSTATE BIOPSY     ROBOT ASSISTED LAPAROSCOPIC RADICAL PROSTATECTOMY N/A 06/08/2017   Procedure: XI ROBOTIC ASSISTED LAPAROSCOPIC RADICAL PROSTATECTOMY;  Surgeon: Lucas Mallow, MD;  Location: [facility];  Service: Urology;  Laterality: N/A;    Current Outpatient Medications  Medication Instructions   amLODipine (NORVASC) 10 mg, Oral, Daily   atorvastatin (LIPITOR) 40 mg, Oral, Daily at bedtime   carvedilol (COREG) 6.25 mg, Oral, 2 times daily with meals   losartan-hydrochlorothiazide (HYZAAR) 50-12.5 MG tablet TAKE 1 TABLET BY MOUTH EVERY DAY   Xtandi 80 mg, Oral, 2 times daily       Objective:   Physical Exam BP 128/84   Pulse 69   Temp 98.1 F (36.7 C) (Oral)   Resp 16    Ht 5' 10.5" (1.791 m)   Wt 211 lb 4 oz (95.8 kg)   SpO2 97%   BMI 29.88 kg/m  General:   Well developed, NAD, BMI noted. HEENT:  Normocephalic . Face symmetric, atraumatic Lungs:  CTA B Normal respiratory effort, no intercostal retractions, no accessory muscle use. Heart: RRR,  no murmur.  Lower extremities: no pretibial edema bilaterally  Skin: Not pale. Not jaundice Neurologic:  alert & oriented X3.  Speech normal, gait appropriate for age and unassisted Psych--  Cognition and judgment appear intact.  Cooperative with normal attention span and concentration.  Behavior appropriate. No anxious or depressed appearing.      Assessment   Assessment  DM (A1c 6.15 May 2021) HTN Hyperlipidemia Prostate ca dx 2019; s/p radical prostatectomy 05/2017, finished  XRT 12/2017 GI: --Colon polyps --Iron deficient anemia, --EGD 4-15 : Gastritis, H. pylori positive: Status post treatment. HH likely causing  C[place]on erosions ---> a  cause for chronic blood loss.  + H. pylori gastritis 5 /2015,(-) breath test 11-2014  12/2019: Subdural hematoma, large, s/p SEPS  PLAN BMP A1c   DM, last A1c was 6.6.  Diet controlled.  Unable to lose weight.  Recommend a more structured diet.  Recheck A1c. HTN: BP well-controlled here at home (never more than 130/85.  Last creatinine is slightly up, check BMP. High cholesterol, last LDL 107, atorvastatin was increased to 40 mg  but he only has been taking 20 mg.  Plan: Take atorvastatin 20 mg twice daily until he ran out then 40 mg tablets once a day. Abnormal CT chest abdomen and pelvis: Done 12/21/2021, Cipro report, that triggered a colonoscopy done by GI.  Results were evaluated by oncology as well. Prostate cancer: Last note from oncology >>  H/O prostate cancer with oligometastatic disease involving 3 ribs: --Under the care of Urologist, Dr. Link Snuffer.  --Underwent robotic radical prostatectomy on 5/29/20219. Pathology showed prostatic  adenocarcinoma, gleason score 4 + 5 = 9. 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 Lymph nodes negative for malignancy. Pathologic stage pT3bN0.  --Received adjuvant radiation to prostate, seminal vesicles, and pelvic lymph nodes from 11/09/2017-01/02/2018 --Underwent radiation to  left third, left fifth and right second ribs from 08/20/2020-08/29/2020.  --Currently receives ADT therapy.  --Labs from 11/03/2021 showed PSA 0.32 and testosterone <10.  Iron deficiency anemia: Last hemoglobin is stable.  No GI symptoms.  Had a recent colonoscopy. RTC 4 months    11 Here for CPX DM: Previously prediabetes, last A1c 6.5.  Currently on diet controlled.  Check A1c. HTN: Reports great ambulatory BPs, on amlodipine, carvedilol and Hyzaar.  Checking labs High cholesterol: On Lipitor 20, now with a Dx of DM, LDL goal around 100.  Checking FLP. Pulmonary nodule, LUL.  Found on CT chest 01/13/2021, saw pulmonary.  F/u CT chest done 08-2021, stable Prostate cancer: Per urology. RTC 4 months

## 2022-04-07 NOTE — Patient Instructions (Addendum)
Consider a structured diet such as weight watchers.  Continue checking your blood pressures. BP GOAL is between 110/65 and  135/85. If it is consistently higher or lower, let me know  Okay to take atorvastatin 20 mg: 2 tablets together once daily.  Once you run out, get the atorvastatin 40 mg and take only 1 tablet daily.    GO TO THE LAB : Get the blood work     Joseph Golden, Joseph Golden Come back for checkup in 4 months   Vaccines I recommend:  Tdap (tetanus) RSV vaccine  Per our records you are due for your diabetic eye exam. Please contact your eye doctor to schedule an appointment. Please have them send copies of your office visit notes to [place]. Our fax number is [phone]. If you need a referral to an eye doctor please let us know.

## 2022-04-08 NOTE — Assessment & Plan Note (Signed)
DM, last A1c was 6.6.  Diet controlled.  Unable to lose weight.  Recommend a more structured diet (weight watchers?).  Recheck A1c. HTN: BP well-controlled here at home (never more than 130/85.  Last creatinine is slightly up, check BMP. High cholesterol, last LDL 107, atorvastatin was increased to 40 mg but he only has been taking 20 mg.  Plan: Take atorvastatin 20 mg two qd until he ran out then 40 mg tablets once a day. Abnormal CT chest abdomen and pelvis: Done 12/21/2021, see report, that triggered a colonoscopy done by GI.  CT results were evaluated by oncology as well. Prostate cancer: Last note from oncology >> H/O prostate cancer with oligometastatic disease involving 3 ribs: --Under the care of Urologist, Dr. Link Snuffer.  --Underwent robotic radical prostatectomy on 5/29/20219. Pathology showed prostatic adenocarcinoma, gleason score 4 + 5 = 9. 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 Lymph nodes negative for malignancy. Pathologic stage pT3bN0.  --Received adjuvant radiation to prostate, seminal vesicles, and pelvic lymph nodes from 11/09/2017-01/02/2018 --Underwent radiation to  left third, left fifth and right second ribs from 08/20/2020-08/29/2020.  --Currently receives ADT therapy.  --Labs from 11/03/2021 showed PSA 0.32 and testosterone <10.  Iron deficiency anemia: Last hemoglobin is stable.  No GI symptoms.  Had a recent colonoscopy. RTC 4 months

## 2022-04-19 ENCOUNTER — Telehealth: Payer: Self-pay | Admitting: Internal Medicine

## 2022-04-19 NOTE — Telephone Encounter (Signed)
Inbound call from [place] at [facility], Dr. Shannan Harper office requesting OV and Procedure notes from patients's procedures in 12/2021.  [phone] Attn: Jill Alexanders.

## 2022-04-20 ENCOUNTER — Encounter: Payer: Self-pay | Admitting: Internal Medicine

## 2022-04-20 NOTE — Telephone Encounter (Signed)
Pt OV and Procedure notes from patients's procedures in 12/2021 faxed to [phone]: Joseph Golden

## 2022-04-21 ENCOUNTER — Ambulatory Visit (HOSPITAL_COMMUNITY)
Admission: RE | Admit: 2022-04-21 | Discharge: 2022-04-21 | Disposition: A | Discharge status: Home / Self Care | Payer: Medicare PPO | Source: Ambulatory Visit | Attending: Physician Assistant | Admitting: Physician Assistant

## 2022-04-21 DIAGNOSIS — K5790 Diverticulosis of intestine, part unspecified, without perforation or abscess without bleeding: Secondary | ICD-10-CM | POA: Insufficient documentation

## 2022-04-21 DIAGNOSIS — M7989 Other specified soft tissue disorders: Secondary | ICD-10-CM | POA: Diagnosis not present

## 2022-04-21 DIAGNOSIS — K449 Diaphragmatic hernia without obstruction or gangrene: Secondary | ICD-10-CM | POA: Diagnosis not present

## 2022-04-21 DIAGNOSIS — K7689 Other specified diseases of liver: Secondary | ICD-10-CM | POA: Diagnosis not present

## 2022-04-21 MED ORDER — IOHEXOL 300 MG/ML  SOLN
100.0000 mL | Freq: Once | INTRAMUSCULAR | Status: AC | PRN
  Administered 2022-04-21: 100 mL via INTRAVENOUS

## 2022-04-21 MED ORDER — SODIUM CHLORIDE (PF) 0.9 % IJ SOLN
INTRAMUSCULAR | Status: AC
  Filled 2022-04-21: qty 50

## 2022-05-06 ENCOUNTER — Other Ambulatory Visit: Payer: Medicare PPO

## 2022-05-06 ENCOUNTER — Telehealth: Payer: Self-pay | Admitting: Hematology and Oncology

## 2022-05-06 NOTE — Telephone Encounter (Signed)
Reached out to patient to add labs per 4/25 IB message. Patient aware.

## 2022-05-07 ENCOUNTER — Inpatient Hospital Stay: Payer: Medicare PPO

## 2022-05-07 ENCOUNTER — Other Ambulatory Visit: Payer: Self-pay | Admitting: Hematology and Oncology

## 2022-05-07 ENCOUNTER — Inpatient Hospital Stay: Payer: Medicare PPO | Attending: Physician Assistant | Admitting: Hematology and Oncology

## 2022-05-07 VITALS — BP 141/88 | HR 65 | Temp 97.9°F | Resp 13 | Wt 206.0 lb

## 2022-05-07 DIAGNOSIS — M7989 Other specified soft tissue disorders: Secondary | ICD-10-CM

## 2022-05-07 DIAGNOSIS — E785 Hyperlipidemia, unspecified: Secondary | ICD-10-CM | POA: Diagnosis not present

## 2022-05-07 DIAGNOSIS — Z79899 Other long term (current) drug therapy: Secondary | ICD-10-CM | POA: Insufficient documentation

## 2022-05-07 DIAGNOSIS — I10 Essential (primary) hypertension: Secondary | ICD-10-CM | POA: Insufficient documentation

## 2022-05-07 DIAGNOSIS — Z803 Family history of malignant neoplasm of breast: Secondary | ICD-10-CM | POA: Insufficient documentation

## 2022-05-07 DIAGNOSIS — Z808 Family history of malignant neoplasm of other organs or systems: Secondary | ICD-10-CM | POA: Diagnosis not present

## 2022-05-07 DIAGNOSIS — Z800 Family history of malignant neoplasm of digestive organs: Secondary | ICD-10-CM | POA: Diagnosis not present

## 2022-05-07 DIAGNOSIS — C61 Malignant neoplasm of prostate: Secondary | ICD-10-CM | POA: Insufficient documentation

## 2022-05-07 DIAGNOSIS — R195 Other fecal abnormalities: Secondary | ICD-10-CM | POA: Insufficient documentation

## 2022-05-07 DIAGNOSIS — R1900 Intra-abdominal and pelvic swelling, mass and lump, unspecified site: Secondary | ICD-10-CM | POA: Diagnosis not present

## 2022-05-07 DIAGNOSIS — Z87891 Personal history of nicotine dependence: Secondary | ICD-10-CM | POA: Insufficient documentation

## 2022-05-07 DIAGNOSIS — Z9079 Acquired absence of other genital organ(s): Secondary | ICD-10-CM | POA: Diagnosis not present

## 2022-05-07 DIAGNOSIS — Z8249 Family history of ischemic heart disease and other diseases of the circulatory system: Secondary | ICD-10-CM | POA: Insufficient documentation

## 2022-05-07 LAB — CMP (CANCER CENTER ONLY)
ALT: 13 U/L (ref 0–44)
AST: 16 U/L (ref 15–41)
Albumin: 4.3 g/dL (ref 3.5–5.0)
Alkaline Phosphatase: 91 U/L (ref 38–126)
Anion gap: 6 (ref 5–15)
BUN: 15 mg/dL (ref 8–23)
CO2: 28 mmol/L (ref 22–32)
Calcium: 10.3 mg/dL (ref 8.9–10.3)
Chloride: 106 mmol/L (ref 98–111)
Creatinine: 0.92 mg/dL (ref 0.61–1.24)
GFR, Estimated: >60 mL/min (ref >60)
Glucose, Bld: 113 mg/dL — ABNORMAL HIGH (ref 70–99)
Potassium: 4.2 mmol/L (ref 3.5–5.1)
Sodium: 140 mmol/L (ref 135–145)
Total Bilirubin: 0.6 mg/dL (ref 0.3–1.2)
Total Protein: 8 g/dL (ref 6.5–8.1)

## 2022-05-07 LAB — CBC WITH DIFFERENTIAL (CANCER CENTER ONLY)
Abs Immature Granulocytes: 0.01 10*3/uL (ref 0.00–0.07)
Basophils Absolute: 0 10*3/uL (ref 0.0–0.1)
Basophils Relative: 0 %
Eosinophils Absolute: 0.3 10*3/uL (ref 0.0–0.5)
Eosinophils Relative: 6 %
HCT: 39.1 % (ref 39.0–52.0)
Hemoglobin: 13.2 g/dL (ref 13.0–17.0)
Immature Granulocytes: 0 %
Lymphocytes Relative: 17 %
Lymphs Abs: 0.8 10*3/uL (ref 0.7–4.0)
MCH: 30.6 pg (ref 26.0–34.0)
MCHC: 33.8 g/dL (ref 30.0–36.0)
MCV: 90.5 fL (ref 80.0–100.0)
Monocytes Absolute: 0.5 10*3/uL (ref 0.1–1.0)
Monocytes Relative: 11 %
Neutro Abs: 3.2 10*3/uL (ref 1.7–7.7)
Neutrophils Relative %: 66 %
Platelet Count: 240 10*3/uL (ref 150–400)
RBC: 4.32 MIL/uL (ref 4.22–5.81)
RDW: 15.5 % (ref 11.5–15.5)
WBC Count: 4.9 10*3/uL (ref 4.0–10.5)
nRBC: 0 % (ref 0.0–0.2)

## 2022-05-07 NOTE — Progress Notes (Signed)
[facility] [phone]   [phone]  PROGRESS NOTE:  Patient Care Team: Wanda Plump, MD as PCP - General (Internal Medicine) Iva Boop, MD as Consulting Physician (Gastroenterology) Crista Elliot, MD as Consulting Physician (Urology) Margaretmary Dys, MD as Consulting Physician (Radiation Oncology) Axel Filler, Larna Daughters, NP as Nurse Practitioner (Hematology and Oncology) Maryclare Labrador, RN as Registered Nurse  CHIEF COMPLAINTS/PURPOSE OF CONSULTATION:  Soft tissue density/mass in the abdomen  DIAGNOSTIC WORKUP: 12/04/2021: PET PSMA scan: mild to moderately PSMA avid soft tissue mass along the medial margin of the ascending colon adjacent to the hepatic flexure. 12/21/2021: CT CAP:  Redemonstration of soft tissue density lesion intimately associated with the medial wall of the ascending colon. 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 Biopsies not obtained.    INTERVAL HISTORY: Joseph Golden returns for a follow up to review the diagnostic workup for the soft tissue mass near the ascending colon. He is unaccompanied for this visit.   Joseph Golden reports he has been very well in the interim since her last visit.  He had no pain or abdominal discomfort.  He reports he has good bowel movements every 1 to 2 days.  He notes no loose or liquid bowel movements.  He also notes that they are normal in color with no blood, or dark tarry stools.  He is eating quite well and his weight is been quite steady.  He has a goal of reaching 200 pounds, and he is quite close to it.  He has not noticed any bleeding from any other source such as nosebleeds, gum bleeding, or dark stools.  His prostate cancer continues to be managed by his urologist.. He denies fevers, chills, night sweats, shortness of breath, chest pain or cough. He has no other complaints. Rest of the 10 point ROS is  below.   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    SURGICAL HISTORY: Past Surgical History:  Procedure Laterality Date   COLONOSCOPY  2013   LYMPHADENECTOMY Bilateral 06/08/2017   Procedure: LYMPHADENECTOMY;  Surgeon: Crista Elliot, MD;  Location: [facility];  Service: Urology;  Laterality: Bilateral;   NO PAST SURGERIES     PROSTATE BIOPSY     ROBOT ASSISTED LAPAROSCOPIC RADICAL PROSTATECTOMY N/A 06/08/2017   Procedure: XI ROBOTIC ASSISTED LAPAROSCOPIC RADICAL PROSTATECTOMY;  Surgeon: Crista Elliot, MD;  Location: [facility];  Service: Urology;  Laterality: N/A;    SOCIAL HISTORY: Social History   Socioeconomic History   Marital status: Married    Spouse name: Not on file   Number of children: 5   Years of education: Not on file   Highest education level: Bachelor's degree (e.g., BA, AB, BS)  Occupational History   Occupation: retired   Tobacco Use   Smoking status: Former    Types: Pipe    Quit date:  2007    Years since quitting: 17.3   Smokeless tobacco: Never   Tobacco comments:    quit ~ 2005, smoked pipe   Vaping Use   Vaping Use: Never used  Substance and Sexual Activity   Alcohol use: Yes    Alcohol/week: 3.0 standard drinks of alcohol    Types: 2 Cans of beer, 1 Shots of liquor per week    Comment: social   Drug use: No   Sexual activity: Not Currently  Other Topics Concern   Not on file  Social History Narrative   Married for 50   years. Lives in [place]. One son and four daughters.   Household: wife, 1 daughters and 1 g-son   Social Determinants of Health   Financial Resource Strain: Low Risk  (04/01/2022)   Overall Financial Resource Strain (CARDIA)    Difficulty of Paying Living Expenses: Not hard at all  Food Insecurity: No Food Insecurity (04/01/2022)   Hunger Vital Sign    Worried About Running Out of Food in the Last Year: Never true    Ran Out of Food in the Last Year: Never true  Transportation Needs: No Transportation Needs (04/01/2022)   PRAPARE - Administrator, Civil Service (Medical): No    Lack of Transportation (Non-Medical): No  Physical Activity: Insufficiently Active (04/01/2022)   Exercise Vital Sign    Days of Exercise per Week: 3 days    Minutes of Exercise per Session: 40 min  Stress: No Stress Concern Present (04/01/2022)   [employer] of Occupational Health - Occupational Stress Questionnaire    Feeling of Stress : Not at all  Social Connections: Socially Integrated (04/01/2022)   Social Connection and Isolation Panel [NHANES]    Frequency of Communication with Friends and Family: More than three times a week    Frequency of Social Gatherings with Friends and Family: Twice a week    Attends Religious Services: More than 4 times per year    Active Member of [employer] or Organizations: Yes    Attends Engineer, structural: More than 4 times per year    Marital Status: Married  Catering manager Violence: Not At Risk (12/22/2020)   Humiliation, Afraid, Rape, and Kick questionnaire    Fear of Current or Ex-Partner: No    Emotionally Abused: No    Physically Abused: No    Sexually Abused: No    FAMILY HISTORY: Family History  Problem Relation Age of Onset   Hypertension Mother    Breast cancer Mother    Brain cancer Father 81   Hypertension Sister    Hypertension Brother    Stomach cancer Other    Colon cancer Neg Hx    Esophageal cancer Neg Hx    Rectal  cancer Neg Hx    Prostate cancer Neg Hx    Diabetes Neg Hx    CAD Neg Hx     ALLERGIES:  has No Known Allergies.  MEDICATIONS:  Current Outpatient Medications  Medication Sig Dispense Refill   amLODipine (NORVASC) 10 MG tablet Take 1 tablet (10 mg total) by mouth daily. 90 tablet 1   atorvastatin (LIPITOR) 40 MG tablet Take 1 tablet (40 mg total) by mouth at bedtime. 90 tablet 1   carvedilol (COREG) 6.25 MG tablet TAKE 1 TABLET BY MOUTH 2 TIMES DAILY WITH A MEAL. 180 tablet 1   losartan-hydrochlorothiazide (HYZAAR) 50-12.5 MG tablet TAKE 1 TABLET BY MOUTH EVERY DAY 90 tablet 1   XTANDI 40  MG tablet Take 80 mg by mouth 2 (two) times daily.     No current facility-administered medications for this visit.    REVIEW OF SYSTEMS:   Constitutional: ( - ) fevers, ( - )  chills , ( - ) night sweats Eyes: ( - ) blurriness of vision, ( - ) double vision, ( - ) watery eyes Ears, nose, mouth, throat, and face: ( - ) mucositis, ( - ) sore throat Respiratory: ( - ) cough, ( - ) dyspnea, ( - ) wheezes Cardiovascular: ( - ) palpitation, ( - ) chest discomfort, ( - ) lower extremity swelling Gastrointestinal:  ( - ) nausea, ( - ) heartburn, ( - ) change in bowel habits Skin: ( - ) abnormal skin rashes Lymphatics: ( - ) new lymphadenopathy, ( - ) easy bruising Neurological: ( - ) numbness, ( - ) tingling, ( - ) new weaknesses Behavioral/Psych: ( - ) mood change, ( - ) new changes  All other systems were reviewed with the patient and are negative.  PHYSICAL EXAMINATION: ECOG PERFORMANCE STATUS: 0 - Asymptomatic  Vitals:   05/07/22 1027  BP: (!) 141/88  Pulse: 65  Resp: 13  Temp: 97.9 F (36.6 C)  SpO2: 99%    Filed Weights   05/07/22 1027  Weight: 206 lb (93.4 kg)     GENERAL: well appearing male in NAD  SKIN: skin color, texture, turgor are normal, no rashes or significant lesions EYES: conjunctiva are pink and non-injected, sclera clear LUNGS: clear to auscultation and percussion  with normal breathing effort HEART: regular rate & rhythm and no murmurs and no lower extremity edema Musculoskeletal: no cyanosis of digits and no clubbing  PSYCH: alert & oriented x 3, fluent speech NEURO: no focal motor/sensory deficits  LABORATORY DATA:  I have reviewed the data as listed    Latest Ref Rng & Units 05/07/2022    9:29 AM 12/16/2021    1:07 PM 12/07/2021   10:51 AM  CBC  WBC 4.0 - 10.5 K/uL 4.9  5.6  4.9   Hemoglobin 13.0 - 17.0 g/dL 16.1  09.6  04.5   Hematocrit 39.0 - 52.0 % 39.1  36.6  36.4   Platelets 150 - 400 K/uL 240  236  225.0        Latest Ref Rng & Units 05/07/2022    9:29 AM 04/07/2022   10:31 AM 12/16/2021    1:07 PM  CMP  Glucose 70 - 99 mg/dL 409  811  914   BUN 8 - 23 mg/dL 15  13  16    Creatinine 0.61 - 1.24 mg/dL 7.82  9.56  2.13   Sodium 135 - 145 mmol/L 140  138  140   Potassium 3.5 - 5.1 mmol/L 4.2  4.3  3.8   Chloride 98 - 111 mmol/L 106  105  105   CO2 22 - 32 mmol/L 28  27  28    Calcium 8.9 - 10.3 mg/dL 08.6  9.5  9.9   Total Protein 6.5 - 8.1 g/dL 8.0   7.7   Total Bilirubin 0.3 - 1.2 mg/dL 0.6   0.4   Alkaline Phos 38 - 126 U/L 91   93   AST 15 - 41 U/L 16   17   ALT 0 - 44 U/L 13   18      RADIOGRAPHIC STUDIES: I have personally reviewed the radiological images as listed and agreed with the findings in the report. CT Abdomen  Pelvis W Contrast  Result Date: 04/22/2022 CLINICAL DATA:  Follow up peritoneal mass EXAM: CT ABDOMEN AND PELVIS WITH CONTRAST TECHNIQUE: Multidetector CT imaging of the abdomen and pelvis was performed using the standard protocol following bolus administration of intravenous contrast. RADIATION DOSE REDUCTION: This exam was performed according to the departmental dose-optimization program which includes automated exposure control, adjustment of the mA and/or kV according to patient size and/or use of iterative reconstruction technique. CONTRAST:  OMNIPAQUE IOHEXOL 300 MG/ML  SOLN COMPARISON:  None  Available. FINDINGS: Lower chest: Large hiatal hernia. Linear bibasilar scarring or subsegmental atelectasis. Stable pleural-based nodularity consistent with rounded atelectasis right base laterally. Hepatobiliary: Hepatic fatty infiltration. Hepatic cysts are stable measuring up to 5 cm. No intrahepatic ductal dilatation identified. Unremarkable gallbladder. Pancreas: Unremarkable. No pancreatic ductal dilatation or surrounding inflammatory changes. Spleen: Normal in size without focal abnormality. Adrenals/Urinary Tract: Subcentimeter cyst left kidney. This does not need to be followed up with imaging. No nephrolithiasis or hydronephrosis. No adrenal lesions. Unremarkable urinary bladder. Stomach/Bowel: Stomach is within normal limits. Appendix not identified. No evidence of bowel wall thickening, distention, or inflammatory changes. Prior soft tissue density mesenteric mass adjacent to the ascending colon measures 2.8 x 2.1 x 1.5 cm. Based on measurements in the same location and manner there is no change compared to the prior study. Vascular/Lymphatic: No significant vascular findings are present. No enlarged abdominal or pelvic lymph nodes. Reproductive: Status post prostatectomy. Other: No abdominal wall hernia or abnormality. No abdominopelvic ascites. Musculoskeletal: Lumbosacral degenerative changes are noted. IMPRESSION: 1. Stable indeterminate soft tissue mass adjacent to the ascending colon. 2. Hepatic cysts measuring up to 5 cm. 3. Large paraesophageal hiatal hernia. 4. Diverticulosis. Electronically Signed   By: Layla Maw M.D.   On: 04/22/2022 22:37    ASSESSMENT & PLAN IMAAD REUSS is a 77 y.o. male who presents to the diagnostic clinic due to recent PET imaging concerning for malignancy. We reviewed the PET imaging and the suspicious soft tissue lesion near the ascending colon. We recommend patient proceed with CT CAP with contrast to further evaluate the lesion in question. We discussed  the need for tissue biopsy. If there are no other targetable lesions identified with upcoming CT scan, we will refer patient to general surgery to evaluate for surgical biopsy.     #Abdominal soft tissue density/mass--most consistent with lipoma: --Workup includes CT CAP from 12/21/2021 that redemonstrated soft tissue lesion near the ascending colon. 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  --CEA, CA125 and PSA tumor markers were normal --Discussed surveillance versus pursuing further workup including percutaneous versus surgical biopsy. We decided to monitor for now.  Strict return precautions for any bowel symptoms or signs or symptoms concerning for GI bleed. --labs today show white blood cell 4.9, hemoglobin 13.2, MCV 90.5, and platelets of 240 --RTC PRN, no routine follow-up required at this time.  #H/O prostate cancer with oligometastatic disease involving 3 ribs: --Under the care of Urologist, Dr. Modena Slater.  --Underwent robotic radical prostatectomy on 5/29/20219. Pathology showed prostatic adenocarcinoma, gleason score 4 + 5 = 9. 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 Lymph nodes negative for malignancy. Pathologic stage [id number].  --Received adjuvant radiation to prostate, seminal vesicles, and pelvic lymph nodes from 11/09/2017-01/02/2018 --Underwent radiation to  left third, left fifth and right second ribs from 08/20/2020-08/29/2020.  --Currently receives ADT therapy.  --Labs from 11/03/2021 showed PSA 0.32 and testosterone <10.  --  We can be available to help treat his prostate cancer if required.  For now defer to his urologist who is the primary physician caring for this issue.   No orders of the defined types were placed in this encounter.   All questions were answered. The patient knows to call the clinic with any problems, questions or concerns.  I have spent a total of 30 minutes minutes of  face-to-face and non-face-to-face time, preparing to see the patient,performing a medically appropriate examination, counseling and educating the patient, ordering tests/procedures, documenting clinical information in the electronic health record and care coordination.   Ulysees Barns, MD Department of Hematology/Oncology [facility] Phone: [phone] Pager: [phone] Email: [email or web address]

## 2022-05-12 DIAGNOSIS — C61 Malignant neoplasm of prostate: Secondary | ICD-10-CM | POA: Diagnosis not present

## 2022-05-12 LAB — PSA: PSA: <0.015

## 2022-05-14 ENCOUNTER — Ambulatory Visit: Payer: Medicare PPO | Admitting: Hematology and Oncology

## 2022-05-19 DIAGNOSIS — C61 Malignant neoplasm of prostate: Secondary | ICD-10-CM | POA: Diagnosis not present

## 2022-05-20 ENCOUNTER — Encounter: Payer: Self-pay | Admitting: Internal Medicine

## 2022-05-22 ENCOUNTER — Other Ambulatory Visit: Payer: Self-pay | Admitting: Internal Medicine

## 2022-05-30 ENCOUNTER — Other Ambulatory Visit: Payer: Self-pay | Admitting: Internal Medicine

## 2022-06-16 ENCOUNTER — Telehealth: Payer: Self-pay | Admitting: Internal Medicine

## 2022-06-16 NOTE — Telephone Encounter (Signed)
Copied from CRM [id number]. Topic: Medicare AWV >> Jun 16, 2022 10:20 AM Payton Doughty wrote: Reason for CRM: LM 06/16/2022 to schedule AWV   Verlee Rossetti; Care Guide Ambulatory Clinical Support [facility] Direct Dial: [phone]

## 2022-08-06 ENCOUNTER — Encounter: Payer: Self-pay | Admitting: Internal Medicine

## 2022-08-06 ENCOUNTER — Ambulatory Visit: Payer: Medicare PPO | Admitting: Internal Medicine

## 2022-08-06 VITALS — BP 136/64 | HR 60 | Temp 98.4°F | Resp 16 | Ht 70.5 in | Wt 204.2 lb

## 2022-08-06 DIAGNOSIS — E785 Hyperlipidemia, unspecified: Secondary | ICD-10-CM | POA: Diagnosis not present

## 2022-08-06 DIAGNOSIS — E119 Type 2 diabetes mellitus without complications: Secondary | ICD-10-CM

## 2022-08-06 DIAGNOSIS — I10 Essential (primary) hypertension: Secondary | ICD-10-CM

## 2022-08-06 LAB — ALT: ALT: 11 U/L (ref 0–53)

## 2022-08-06 LAB — LIPID PANEL
Cholesterol: 188 mg/dL (ref 0–200)
HDL: 69.2 mg/dL (ref >39.00)
LDL Cholesterol: 106 mg/dL — ABNORMAL HIGH (ref 0–99)
NonHDL: 118.62
Total CHOL/HDL Ratio: 3
Triglycerides: 65 mg/dL (ref 0.0–149.0)
VLDL: 13 mg/dL (ref 0.0–40.0)

## 2022-08-06 LAB — HEMOGLOBIN A1C: Hgb A1c MFr Bld: 6.2 % (ref 4.6–6.5)

## 2022-08-06 LAB — AST: AST: 15 U/L (ref 0–37)

## 2022-08-06 NOTE — Patient Instructions (Addendum)
Vaccines I recommend: Tdap (tetanus) RSV vaccine Flu shot this fall   Check the  blood pressure regularly BP GOAL is between 110/65 and  135/85. If it is consistently higher or lower, let me know     GO TO THE LAB : Get the blood work     GO TO THE FRONT DESK, PLEASE SCHEDULE YOUR APPOINTMENTS Come back for   a physical exam by 11-2022   Per our records you are due for your diabetic eye exam. Please contact your eye doctor to schedule an appointment. Please have them send copies of your office visit notes to [place]. Our fax number is [phone]. If you need a referral to an eye doctor please let us know.

## 2022-08-06 NOTE — Progress Notes (Unsigned)
Subjective:    Patient ID: Joseph Golden, male    DOB: [birth year], 77 y.o.   MRN: [id number]  DOS:  08/06/2022 Type of visit - description: Routine checkup  Since the last office visit is feeling well. Has improved his diet. Ambulatory BPs mostly in the 130, occasionally higher. Denies any lower extremity paresthesias.  BP Readings from Last 3 Encounters:  08/06/22 136/64  05/07/22 (!) 141/88  04/07/22 128/84   Review of Systems See above   Past Medical History:  Diagnosis Date   Allergy    SEASONAL   Aortic atherosclerosis (HCC)    Diverticulosis 11/16/2012   Helicobacter pylori gastritis 04/23/2013   Hepatic steatosis    Hiatal hernia    large   HTN (hypertension)    Iron deficiency anemia secondary to blood loss (chronic) - Hiatal hernia with Sheria Lang erosions 03/01/2013   Other and unspecified hyperlipidemia 10/31/2012   Personal history of colonic adenoma 12/30/2011   12/2011 - 12 mm adenoma with high-grade dysplasia   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    Past Surgical History:  Procedure Laterality Date   COLONOSCOPY  2013   LYMPHADENECTOMY Bilateral 06/08/2017   Procedure: LYMPHADENECTOMY;  Surgeon: Crista Elliot, MD;  Location: [facility];  Service: Urology;  Laterality: Bilateral;   NO PAST SURGERIES     PROSTATE BIOPSY     ROBOT ASSISTED LAPAROSCOPIC RADICAL PROSTATECTOMY N/A 06/08/2017   Procedure: XI ROBOTIC ASSISTED LAPAROSCOPIC RADICAL PROSTATECTOMY;  Surgeon: Crista Elliot, MD;  Location: [facility];  Service: Urology;  Laterality: N/A;    Current Outpatient Medications  Medication Instructions   amLODipine (NORVASC) 10 mg, Oral, Daily   atorvastatin (LIPITOR) 40 mg, Oral, Daily at bedtime   carvedilol (COREG) 6.25 mg, Oral, 2 times daily with meals   losartan-hydrochlorothiazide (HYZAAR) 50-12.5 MG tablet TAKE 1 TABLET BY MOUTH EVERY DAY   Xtandi 80 mg, Oral, 2 times daily       Objective:   Physical Exam BP 136/64   Pulse  60   Temp 98.4 F (36.9 C) (Oral)   Resp 16   Ht 5' 10.5" (1.791 m)   Wt 204 lb 4 oz (92.6 kg)   SpO2 97%   BMI 28.89 kg/m  General:   Well developed, NAD, BMI noted. HEENT:  Normocephalic . Face symmetric, atraumatic Lungs:  CTA B Normal respiratory effort, no intercostal retractions, no accessory muscle use. Heart: RRR,  no murmur.  DM foot exam: No edema, good pedal pulses, pinprick examination normal Skin: Not pale. Not jaundice Neurologic:  alert & oriented X3.  Speech normal, gait appropriate for age and unassisted Psych--  Cognition and judgment appear intact.  Cooperative with normal attention span and concentration.  Behavior appropriate. No anxious or depressed appearing.      Assessment     Assessment  DM (A1c 6.15 May 2021) HTN Hyperlipidemia Prostate ca dx 2019 --robotic radical prostatectomy on 5/29/20219.  --gleason score 4 + 5 = 9. 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 Lymph nodes negative  -- s/p adjuvant radiation   from 11/09/2017-01/02/2018 --XRT  left third, left fifth and right second ribs from 08/20/2020-08/29/2020.  -- on ADT therapy as of 03-2022.   GI: --Colon polyps --Iron deficient anemia, --EGD 4-15 : Gastritis, H. pylori positive: Status post treatment. HH likely causing  Cameron erosions ---> a  cause for chronic blood loss.  + H. pylori gastritis 5 /2015,(-) breath test 11-2014  12/2019: Subdural  hematoma, large, s/p SEPS  PLAN DM: Diet controlled, has changed his diet, increase vegetables-decrease processed foods-trying to be more active.  Feet exam negative. Recheck A1c HTN: Ambulatory BPs 130, occasionally goes higher today 140-150.  Recommend to continue checking.  BPs in the office are very good.  Continue amlodipine,Carvedilol and Hyzaar. Dyslipidemia: See LOV, atorvastatin increased to 40 mg, good compliance and tolerance, check FLP AST ALT. Vaccine advice provided RTC 4 months CPX

## 2022-08-08 NOTE — Assessment & Plan Note (Signed)
DM: Diet controlled, has changed his diet, increase vegetables-decrease processed foods-trying to be more active.  Feet exam negative. Recheck A1c HTN: Ambulatory BPs 130, occasionally goes higher today 140-150.  Recommend to continue checking.  BPs in the office are very good.  Continue amlodipine,Carvedilol and Hyzaar. Dyslipidemia: See LOV, atorvastatin increased to 40 mg, good compliance and tolerance, check FLP AST ALT. Vaccine advice provided RTC 4 months CPX

## 2022-08-11 MED ORDER — ATORVASTATIN CALCIUM 80 MG PO TABS: 80.0000 mg | ORAL_TABLET | Freq: Every day | ORAL | 1 refills | Status: DC

## 2022-08-11 NOTE — Addendum Note (Signed)
Addended byConrad Navesink D on: 08/11/2022 08:06 AM   Modules accepted: Orders

## 2022-08-17 ENCOUNTER — Ambulatory Visit (HOSPITAL_BASED_OUTPATIENT_CLINIC_OR_DEPARTMENT_OTHER)
Admission: RE | Admit: 2022-08-17 | Discharge: 2022-08-17 | Disposition: A | Discharge status: Home / Self Care | Payer: Medicare PPO | Source: Ambulatory Visit | Attending: Emergency Medicine | Admitting: Emergency Medicine

## 2022-08-17 DIAGNOSIS — I77810 Thoracic aortic ectasia: Secondary | ICD-10-CM | POA: Diagnosis not present

## 2022-08-17 DIAGNOSIS — J984 Other disorders of lung: Secondary | ICD-10-CM | POA: Diagnosis not present

## 2022-08-17 DIAGNOSIS — R9389 Abnormal findings on diagnostic imaging of other specified body structures: Secondary | ICD-10-CM | POA: Insufficient documentation

## 2022-08-18 DIAGNOSIS — C61 Malignant neoplasm of prostate: Secondary | ICD-10-CM | POA: Diagnosis not present

## 2022-08-25 DIAGNOSIS — C61 Malignant neoplasm of prostate: Secondary | ICD-10-CM | POA: Diagnosis not present

## 2022-09-01 ENCOUNTER — Other Ambulatory Visit: Payer: Self-pay | Admitting: Internal Medicine

## 2022-10-06 ENCOUNTER — Other Ambulatory Visit: Payer: Self-pay | Admitting: Internal Medicine

## 2022-11-17 ENCOUNTER — Telehealth: Payer: Self-pay | Admitting: *Deleted

## 2022-11-17 NOTE — Telephone Encounter (Signed)
LMOM for pt to return call to be scheduled for AWVS.

## 2022-12-14 ENCOUNTER — Encounter: Payer: Self-pay | Admitting: Internal Medicine

## 2022-12-14 ENCOUNTER — Ambulatory Visit (INDEPENDENT_AMBULATORY_CARE_PROVIDER_SITE_OTHER): Payer: Medicare PPO | Admitting: Internal Medicine

## 2022-12-14 VITALS — BP 132/68 | HR 65 | Temp 97.4°F | Resp 16 | Ht 70.5 in | Wt 208.1 lb

## 2022-12-14 DIAGNOSIS — Z23 Encounter for immunization: Secondary | ICD-10-CM

## 2022-12-14 DIAGNOSIS — Z0001 Encounter for general adult medical examination with abnormal findings: Secondary | ICD-10-CM

## 2022-12-14 DIAGNOSIS — I10 Essential (primary) hypertension: Secondary | ICD-10-CM

## 2022-12-14 DIAGNOSIS — I7789 Other specified disorders of arteries and arterioles: Secondary | ICD-10-CM | POA: Diagnosis not present

## 2022-12-14 DIAGNOSIS — Z0000 Encounter for general adult medical examination without abnormal findings: Secondary | ICD-10-CM | POA: Diagnosis not present

## 2022-12-14 DIAGNOSIS — E785 Hyperlipidemia, unspecified: Secondary | ICD-10-CM

## 2022-12-14 DIAGNOSIS — E119 Type 2 diabetes mellitus without complications: Secondary | ICD-10-CM | POA: Diagnosis not present

## 2022-12-14 NOTE — Progress Notes (Signed)
Subjective:    Patient ID: Joseph Golden, male    DOB: 1946-01-11, 77 y.o.   MRN: [id number]  DOS:  12/14/2022 Type of visit - description: cpx Here for CPX, no major concerns    Review of Systems   A 14 point review of systems is negative    Past Medical History:  Diagnosis Date   Allergy    SEASONAL   Aortic atherosclerosis (HCC)    Diverticulosis 11/16/2012   Helicobacter pylori gastritis 04/23/2013   Hepatic steatosis    Hiatal hernia    large   HTN (hypertension)    Iron deficiency anemia secondary to blood loss (chronic) - Hiatal hernia with Sheria Lang erosions 03/01/2013   Other and unspecified hyperlipidemia 10/31/2012   Personal history of colonic adenoma 12/30/2011   12/2011 - 12 mm adenoma with high-grade dysplasia   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    Past Surgical History:  Procedure Laterality Date   COLONOSCOPY  2013   LYMPHADENECTOMY Bilateral 06/08/2017   Procedure: LYMPHADENECTOMY;  Surgeon: Crista Elliot, MD;  Location: [facility];  Service: Urology;  Laterality: Bilateral;   NO PAST SURGERIES     PROSTATE BIOPSY     ROBOT ASSISTED LAPAROSCOPIC RADICAL PROSTATECTOMY N/A 06/08/2017   Procedure: XI ROBOTIC ASSISTED LAPAROSCOPIC RADICAL PROSTATECTOMY;  Surgeon: Crista Elliot, MD;  Location: [facility];  Service: Urology;  Laterality: N/A;   Social History   Socioeconomic History   Marital status: Married    Spouse name: Not on file   Number of children: 5   Years of education: Not on file   Highest education level: Bachelor's degree (e.g., BA, AB, BS)  Occupational History   Occupation: retired   Tobacco Use   Smoking status: Former    Types: Pipe    Quit date: 2007    Years since quitting: 17.9   Smokeless tobacco: Never   Tobacco comments:    quit ~ 2005, smoked pipe   Vaping Use   Vaping status: Never Used  Substance and Sexual Activity   Alcohol use: Yes    Alcohol/week: 3.0 standard drinks of alcohol    Types: 2 Cans  of beer, 1 Shots of liquor per week    Comment: social   Drug use: No   Sexual activity: Not Currently  Other Topics Concern   Not on file  Social History Narrative   Married for 50  years. Lives in [place]. One son and four daughters.   Household: wife, 1 daughters and 1 g-son   Social Determinants of Health   Financial Resource Strain: Low Risk  (04/01/2022)   Overall Financial Resource Strain (CARDIA)    Difficulty of Paying Living Expenses: Not hard at all  Food Insecurity: No Food Insecurity (04/01/2022)   Hunger Vital Sign    Worried About Running Out of Food in the Last Year: Never true    Ran Out of Food in the Last Year: Never true  Transportation Needs: No Transportation Needs (04/01/2022)   PRAPARE - Administrator, Civil Service (Medical): No    Lack of Transportation (Non-Medical): No  Physical Activity: Insufficiently Active (04/01/2022)   Exercise Vital Sign    Days of Exercise per Week: 3 days    Minutes of Exercise per Session: 40 min  Stress: No Stress Concern Present (04/01/2022)   [employer] of Occupational Health - Occupational Stress Questionnaire    Feeling of Stress : Not at all  Social Connections: Socially Integrated (04/01/2022)   Social Connection and Isolation Panel [NHANES]    Frequency of Communication with Friends and Family: More than three times a week    Frequency of Social Gatherings with Friends and Family: Twice a week    Attends Religious Services: More than 4 times per year    Active Member of [employer] or Organizations: Yes    Attends Engineer, structural: More than 4 times per year    Marital Status: Married  Catering manager Violence: Not At Risk (12/22/2020)   Humiliation, Afraid, Rape, and Kick questionnaire    Fear of Current or Ex-Partner: No    Emotionally Abused: No    Physically Abused: No    Sexually Abused: No    Current Outpatient Medications  Medication Instructions   amLODipine (NORVASC) 10 mg,  Oral, Daily   atorvastatin (LIPITOR) 80 mg, Oral, Daily at bedtime   carvedilol (COREG) 6.25 mg, Oral, 2 times daily with meals   losartan-hydrochlorothiazide (HYZAAR) 50-12.5 MG tablet 1 tablet, Oral, Daily   Xtandi 80 mg, Oral, 2 times daily       Objective:   Physical Exam BP 132/68   Pulse 65   Temp (!) 97.4 F (36.3 C) (Oral)   Resp 16   Ht 5' 10.5" (1.791 m)   Wt 208 lb 2 oz (94.4 kg)   SpO2 98%   BMI 29.44 kg/m  General: Well developed, NAD, BMI noted Neck: No  thyromegaly  HEENT:  Normocephalic . Face symmetric, atraumatic Lungs:  CTA B Normal respiratory effort, no intercostal retractions, no accessory muscle use. Heart: RRR,  no murmur.  Abdomen:  Not distended, soft, non-tender. No rebound or rigidity.   Lower extremities: no pretibial edema bilaterally  Skin: Exposed areas without rash. Not pale. Not jaundice Neurologic:  alert & oriented X3.  Speech normal, gait appropriate for age and unassisted Strength symmetric and appropriate for age.  Psych: Cognition and judgment appear intact.  Cooperative with normal attention span and concentration.  Behavior appropriate. No anxious or depressed appearing.     Assessment     Assessment  DM (A1c 6.15 May 2021) HTN Hyperlipidemia Prostate ca dx 2019 --robotic radical prostatectomy on 5/29/20219.  --gleason score 4 + 5 = 9. 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 Lymph nodes negative  -- s/p adjuvant radiation   from 11/09/2017-01/02/2018 --XRT  left third, left fifth and right second ribs from 08/20/2020-08/29/2020.  -- on ADT therapy as of 03-2022.   GI: --Colon polyps --Iron deficient anemia, --EGD 4-15 : Gastritis, H. pylori positive: Status post treatment. HH likely causing  Cameron erosions ---> a  cause for chronic blood loss.  + H. pylori gastritis 5 /2015,(-) breath test 11-2014  12/2019: Subdural hematoma, large, s/p SEPS  PLAN Here for CPX - Tdap 2013.   - PNM shot 2015;   prevnar 13: 11-12-14.  PNM 20: 2023 -S/p shingrex   - flu shot today -Recommend: COVID booster if not done recently, RSV, Tdap -CCS: 12-2011 colonoscopy: large polyp  Flex sigmoidoscopy 11 -14  no residual polyp cscope : 05/2017, no polyps, Cscope 12-2021, next per GI -Prostate cancer dx 02/2017 .   f/u by urology -Diet and exercise discussed.  -Labs:   BMP AST ALT FLP CBC A1c - Healthcare POA: Information provided DM: Diet controlled, check A1c.   HTN: Ambulatory BPs range from 115/60 to 135/70.  Continue amlodipine, Hyzaar, checking a BMP and CBC. High cholesterol: Based on last  FLP, Lipitor increased to 80 mg.  Check a FLP AST ALT. Prostate cancer, LOV w/ urology [phone], PSA was 0.0 15. Abnormal CT chest: Next visit with Dr. Delton Coombes 01-2023. 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  Plan is to control HTN, cholesterol, and consider CTA or MRA at the 1 year mark as suggested by radiology RTC 1 year

## 2022-12-14 NOTE — Patient Instructions (Addendum)
Vaccines I recommend: Covid booster Tdap (tetanus) RSV  Continue checking your blood pressure regularly Blood pressure goal:  between 110/65 and  135/85. If it is consistently higher or lower, let me know     GO TO THE LAB : Get the blood work     Next visit with me in 6 months for a checkup Please schedule it at the front desk      Per our records you are due for your diabetic eye exam. Please contact your eye doctor to schedule an appointment. Please have them send copies of your office visit notes to [place]. Our fax number is [phone]. If you need a referral to an eye doctor please let us know.     "Health Care Power of attorney" ,  "Living will" (Advance care planning documents)  If you already have a living will or healthcare power of attorney, is recommended you bring the copy to be scanned in your chart.   The document will be available to all the doctors you see in the system.  Advance care planning is a process that supports adults in  understanding and sharing their preferences regarding future medical care.  The patient's preferences are recorded in documents called Advance Directives and the can be modified at any time while the patient is in full mental capacity.   If you don't have one, please consider create one.      More information at: [email or web address]

## 2022-12-14 NOTE — Assessment & Plan Note (Signed)
Here for CPX - Tdap 2013.   - PNM shot 2015;  prevnar 13: 11-12-14.  PNM 20: 2023 -S/p shingrex   - flu shot today -Recommend: COVID booster if not done recently, RSV, Tdap -CCS: 12-2011 colonoscopy: large polyp  Flex sigmoidoscopy 11 -14  no residual polyp cscope : 05/2017, no polyps, Cscope 12-2021, next per GI -Prostate cancer dx 02/2017 .   f/u by urology -Diet and exercise discussed.  -Labs:   BMP AST ALT FLP CBC A1c - Healthcare POA: Information provided

## 2022-12-14 NOTE — Assessment & Plan Note (Signed)
Here for CPX  DM: Diet controlled, check A1c.   HTN: Ambulatory BPs range from 115/60 to 135/70.  Continue amlodipine, Hyzaar, checking a BMP and CBC. High cholesterol: Based on last FLP, Lipitor increased to 80 mg.  Check a FLP AST ALT. Prostate cancer, LOV w/ urology [phone], PSA was 0.0 15. Abnormal CT chest: Next visit with Dr. Delton Coombes 01-2023. 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  Plan is to control HTN, cholesterol, and consider CTA or MRA at the 1 year mark as suggested by radiology RTC 1 year

## 2022-12-15 LAB — LIPID PANEL
Cholesterol: 194 mg/dL (ref 0–200)
HDL: 74.3 mg/dL (ref >39.00)
LDL Cholesterol: 109 mg/dL — ABNORMAL HIGH (ref 0–99)
NonHDL: 119.52
Total CHOL/HDL Ratio: 3
Triglycerides: 51 mg/dL (ref 0.0–149.0)
VLDL: 10.2 mg/dL (ref 0.0–40.0)

## 2022-12-15 LAB — BASIC METABOLIC PANEL
BUN: 12 mg/dL (ref 6–23)
CO2: 27 meq/L (ref 19–32)
Calcium: 9.8 mg/dL (ref 8.4–10.5)
Chloride: 104 meq/L (ref 96–112)
Creatinine, Ser: 0.71 mg/dL (ref 0.40–1.50)
GFR: 89.82 mL/min (ref >60.00)
Glucose, Bld: 96 mg/dL (ref 70–99)
Potassium: 4.3 meq/L (ref 3.5–5.1)
Sodium: 140 meq/L (ref 135–145)

## 2022-12-15 LAB — CBC WITH DIFFERENTIAL/PLATELET
Basophils Absolute: 0.1 10*3/uL (ref 0.0–0.1)
Basophils Relative: 1.4 % (ref 0.0–3.0)
Eosinophils Absolute: 0.3 10*3/uL (ref 0.0–0.7)
Eosinophils Relative: 6.4 % — ABNORMAL HIGH (ref 0.0–5.0)
HCT: 37.2 % — ABNORMAL LOW (ref 39.0–52.0)
Hemoglobin: 12.3 g/dL — ABNORMAL LOW (ref 13.0–17.0)
Lymphocytes Relative: 19.4 % (ref 12.0–46.0)
Lymphs Abs: 0.9 10*3/uL (ref 0.7–4.0)
MCHC: 33.2 g/dL (ref 30.0–36.0)
MCV: 95.6 fL (ref 78.0–100.0)
Monocytes Absolute: 0.5 10*3/uL (ref 0.1–1.0)
Monocytes Relative: 11.2 % (ref 3.0–12.0)
Neutro Abs: 2.9 10*3/uL (ref 1.4–7.7)
Neutrophils Relative %: 61.6 % (ref 43.0–77.0)
Platelets: 248 10*3/uL (ref 150.0–400.0)
RBC: 3.89 Mil/uL — ABNORMAL LOW (ref 4.22–5.81)
RDW: 15.9 % — ABNORMAL HIGH (ref 11.5–15.5)
WBC: 4.6 10*3/uL (ref 4.0–10.5)

## 2022-12-15 LAB — AST: AST: 16 U/L (ref 0–37)

## 2022-12-15 LAB — ALT: ALT: 10 U/L (ref 0–53)

## 2022-12-15 LAB — HEMOGLOBIN A1C: Hgb A1c MFr Bld: 5.9 % (ref 4.6–6.5)

## 2022-12-20 ENCOUNTER — Encounter: Payer: Self-pay | Admitting: Internal Medicine

## 2022-12-21 DIAGNOSIS — Z809 Family history of malignant neoplasm, unspecified: Secondary | ICD-10-CM | POA: Diagnosis not present

## 2022-12-21 DIAGNOSIS — I10 Essential (primary) hypertension: Secondary | ICD-10-CM | POA: Diagnosis not present

## 2022-12-21 DIAGNOSIS — Z79899 Other long term (current) drug therapy: Secondary | ICD-10-CM | POA: Diagnosis not present

## 2022-12-21 DIAGNOSIS — C61 Malignant neoplasm of prostate: Secondary | ICD-10-CM | POA: Diagnosis not present

## 2022-12-21 DIAGNOSIS — Z8249 Family history of ischemic heart disease and other diseases of the circulatory system: Secondary | ICD-10-CM | POA: Diagnosis not present

## 2022-12-21 DIAGNOSIS — E669 Obesity, unspecified: Secondary | ICD-10-CM | POA: Diagnosis not present

## 2022-12-21 DIAGNOSIS — N529 Male erectile dysfunction, unspecified: Secondary | ICD-10-CM | POA: Diagnosis not present

## 2022-12-21 DIAGNOSIS — Z87891 Personal history of nicotine dependence: Secondary | ICD-10-CM | POA: Diagnosis not present

## 2022-12-21 DIAGNOSIS — E785 Hyperlipidemia, unspecified: Secondary | ICD-10-CM | POA: Diagnosis not present

## 2022-12-21 DIAGNOSIS — I6200 Nontraumatic subdural hemorrhage, unspecified: Secondary | ICD-10-CM | POA: Diagnosis not present

## 2023-01-20 ENCOUNTER — Ambulatory Visit: Payer: Medicare PPO | Admitting: Emergency Medicine

## 2023-01-20 ENCOUNTER — Encounter: Payer: Self-pay | Admitting: Emergency Medicine

## 2023-01-20 VITALS — BP 126/70 | HR 76 | Temp 98.1°F | Ht 70.0 in | Wt 206.8 lb

## 2023-01-20 DIAGNOSIS — R9389 Abnormal findings on diagnostic imaging of other specified body structures: Secondary | ICD-10-CM | POA: Diagnosis not present

## 2023-01-20 NOTE — Assessment & Plan Note (Signed)
 Reviewed his CT scan of the chest from 08/17/2022.  Reassuring study with no change in small 4 mm right upper lobe nodule, stable scattered punctate nodules and a stable area of scarring in the left upper lobe where he had XRT to his rib.  He should not need any further dedicated chest scanning unless there is a new indication going forward.  He continues to follow with urology.  I will be happy to see him if he has any further chest findings or if any change in respiratory status.

## 2023-01-20 NOTE — Progress Notes (Signed)
 Subjective:    Patient ID: Joseph Golden, male    DOB: 05-Jul-1945, 78 y.o.   MRN: [id number]  HPI  ROV 01/20/2023 --follow-up visit for 78 year old man with history of prostate cancer (prostatectomy, XRT) including XRT to a rib lesion 2022.  He has peripheral left upper lobe opacity on chest CT, question some postradiation scar, and a new 4 mm right upper lobe pulmonary nodule.  He underwent a repeat CT scan of the chest in August as below He is feeling well, no SOB, no cough or wheeze. Good functional capacity.   CT scan of the chest 08/17/2022 reviewed by me, showed some scattered linear opacities bilaterally consistent with postinflammatory scarring, stable 4 mm right upper lobe nodule, a few other scattered 1 to 2 mm micronodules that are unchanged.  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     Family History  Problem Relation Age of Onset   Hypertension Mother    Breast cancer Mother    Brain cancer Father 80   Hypertension Sister    Hypertension Brother    Stomach cancer Other    Colon cancer Neg Hx    Esophageal cancer Neg Hx    Rectal cancer Neg Hx    Prostate cancer Neg Hx    Diabetes Neg Hx    CAD Neg Hx      Social History   Socioeconomic History   Marital status: Married    Spouse name: Not on file   Number of children: 5   Years of education: Not on file   Highest education  level: Bachelor's degree (e.g., BA, AB, BS)  Occupational History   Occupation: retired   Tobacco Use   Smoking status: Former    Types: Pipe    Quit date: 2007    Years since quitting: 18.0   Smokeless tobacco: Never   Tobacco comments:    quit ~ 2005, smoked pipe   Vaping Use   Vaping status: Never Used  Substance and Sexual Activity   Alcohol use: Yes    Alcohol/week: 3.0 standard drinks of alcohol    Types: 2 Cans of beer, 1 Shots of liquor per week    Comment: social   Drug use: No   Sexual activity: Not Currently  Other Topics Concern   Not on file  Social History Narrative   Married for 50  years. Lives in [place]. One son and four daughters.   Household: wife, 1 daughters and 1 g-son   Social Drivers of Corporate Investment Banker Strain: Low Risk  (04/01/2022)   Overall Financial Resource Strain (CARDIA)    Difficulty of Paying Living Expenses: Not hard at all  Food Insecurity: No Food Insecurity (04/01/2022)   Hunger Vital Sign    Worried About Running Out of Food in the Last Year: Never true    Ran Out  of Food in the Last Year: Never true  Transportation Needs: No Transportation Needs (04/01/2022)   PRAPARE - Administrator, Civil Service (Medical): No    Lack of Transportation (Non-Medical): No  Physical Activity: Insufficiently Active (04/01/2022)   Exercise Vital Sign    Days of Exercise per Week: 3 days    Minutes of Exercise per Session: 40 min  Stress: No Stress Concern Present (04/01/2022)   [employer] of Occupational Health - Occupational Stress Questionnaire    Feeling of Stress : Not at all  Social Connections: Socially Integrated (04/01/2022)   Social Connection and Isolation Panel [NHANES]    Frequency of Communication with Friends and Family: More than three times a week    Frequency of Social Gatherings with Friends and Family: Twice a week    Attends Religious Services: More than 4 times per year    Active Member of [employer] or  Organizations: Yes    Attends Engineer, Structural: More than 4 times per year    Marital Status: Married  Catering Manager Violence: Not At Risk (12/22/2020)   Humiliation, Afraid, Rape, and Kick questionnaire    Fear of Current or Ex-Partner: No    Emotionally Abused: No    Physically Abused: No    Sexually Abused: No  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 90 tablet 1   atorvastatin  (LIPITOR) 80 MG tablet Take 1 tablet (80 mg total) by mouth at bedtime. 90 tablet 1   carvedilol  (COREG ) 6.25 MG tablet TAKE 1 TABLET BY MOUTH 2 TIMES DAILY WITH A MEAL. 180 tablet 1   losartan -hydrochlorothiazide  (HYZAAR) 50-12.5 MG tablet Take 1 tablet by mouth daily. 90 tablet 1   XTANDI 40 MG tablet Take 80 mg by mouth 2 (two) times daily.     No facility-administered medications prior to visit.         Objective:   Physical Exam Vitals:   01/20/23 1352  BP: 126/70  Pulse: 76  Temp: 98.1 F (36.7 C)  TempSrc: Oral  SpO2: 97%  Weight: 206 lb 12.8 oz (93.8 kg)  Height: 5' 10 (1.778 m)   Gen: Pleasant, well-nourished, in no distress,  normal affect  ENT: No lesions,  mouth clear,  oropharynx clear, no postnasal drip  Neck: No JVD, no stridor  Lungs: No use of accessory muscles, no crackles or wheezing on normal respiration, no wheeze on forced expiration  Cardiovascular: RRR, heart sounds normal, no murmur or gallops, no peripheral edema  Musculoskeletal: No deformities, no cyanosis or clubbing  Neuro: alert, awake, non focal  Skin: Warm, no lesions or rash      Assessment & Plan:   Abnormal CT of the chest Reviewed his CT scan of the chest from 08/17/2022.  Reassuring study with no change in small 4 mm right upper lobe nodule, stable  scattered punctate nodules and a stable area of scarring in the left upper lobe where he had XRT to his rib.  He should not need any further dedicated chest scanning unless there is a new indication going forward.  He continues to follow with urology.  I will be happy to see him if he has any further chest findings or if any change in respiratory status.  Joseph Chris, MD, PhD 01/20/2023, 2:15 PM [facility] [phone] or if no answer before 7:00PM call [phone] For any issues after 7:00PM please call eLink [phone]

## 2023-01-20 NOTE — Patient Instructions (Signed)
 We reviewed your CT scan of the chest today.  This is stable compared with your priors.  Good news. You should not need any more dedicated CT scans of the chest unless there is some new finding with urology or change in symptoms. Please follow Dr. Mekhai Venuto for any new respiratory problems or changes in your breathing.

## 2023-01-27 ENCOUNTER — Ambulatory Visit: Payer: Medicare PPO | Admitting: Emergency Medicine

## 2023-02-20 ENCOUNTER — Other Ambulatory Visit: Payer: Self-pay | Admitting: Internal Medicine

## 2023-02-21 MED ORDER — ATORVASTATIN CALCIUM 80 MG PO TABS: 80.0000 mg | ORAL_TABLET | Freq: Every day | ORAL | 1 refills | Status: DC

## 2023-02-28 ENCOUNTER — Other Ambulatory Visit: Payer: Self-pay | Admitting: Internal Medicine

## 2023-03-01 DIAGNOSIS — C61 Malignant neoplasm of prostate: Secondary | ICD-10-CM | POA: Diagnosis not present

## 2023-03-02 ENCOUNTER — Other Ambulatory Visit: Payer: Self-pay | Admitting: Internal Medicine

## 2023-03-09 DIAGNOSIS — C7951 Secondary malignant neoplasm of bone: Secondary | ICD-10-CM | POA: Diagnosis not present

## 2023-03-09 DIAGNOSIS — C61 Malignant neoplasm of prostate: Secondary | ICD-10-CM | POA: Diagnosis not present

## 2023-03-21 DIAGNOSIS — C61 Malignant neoplasm of prostate: Secondary | ICD-10-CM | POA: Diagnosis not present

## 2023-04-14 ENCOUNTER — Encounter: Payer: Self-pay | Admitting: Internal Medicine

## 2023-05-20 ENCOUNTER — Telehealth: Payer: Self-pay | Admitting: Internal Medicine

## 2023-05-20 NOTE — Telephone Encounter (Signed)
 Copied from CRM [id number]. Topic: Medicare AWV >> May 20, 2023  2:26 PM Juliana Ocean wrote: Reason for CRM: LVM 05/20/2023 to schedule AWV. Please schedule Virtual or Telehealth visits ONLY.   Rosalee Collins; Care Guide Ambulatory Clinical Support [place] [facility] Direct Dial: [phone]

## 2023-05-27 ENCOUNTER — Other Ambulatory Visit: Payer: Self-pay | Admitting: Internal Medicine

## 2023-06-14 ENCOUNTER — Ambulatory Visit: Payer: Medicare PPO | Admitting: Internal Medicine

## 2023-06-14 ENCOUNTER — Encounter: Payer: Self-pay | Admitting: Internal Medicine

## 2023-06-14 VITALS — BP 118/68 | HR 65 | Temp 97.8°F | Resp 16 | Ht 70.0 in | Wt 209.0 lb

## 2023-06-14 DIAGNOSIS — I10 Essential (primary) hypertension: Secondary | ICD-10-CM

## 2023-06-14 DIAGNOSIS — Z0100 Encounter for examination of eyes and vision without abnormal findings: Secondary | ICD-10-CM

## 2023-06-14 DIAGNOSIS — E785 Hyperlipidemia, unspecified: Secondary | ICD-10-CM

## 2023-06-14 DIAGNOSIS — I7789 Other specified disorders of arteries and arterioles: Secondary | ICD-10-CM | POA: Diagnosis not present

## 2023-06-14 DIAGNOSIS — E119 Type 2 diabetes mellitus without complications: Secondary | ICD-10-CM

## 2023-06-14 LAB — BASIC METABOLIC PANEL WITH GFR
BUN: 13 mg/dL (ref 6–23)
CO2: 27 meq/L (ref 19–32)
Calcium: 10.1 mg/dL (ref 8.4–10.5)
Chloride: 104 meq/L (ref 96–112)
Creatinine, Ser: 0.75 mg/dL (ref 0.40–1.50)
GFR: 88.04 mL/min (ref >60.00)
Glucose, Bld: 109 mg/dL — ABNORMAL HIGH (ref 70–99)
Potassium: 4.2 meq/L (ref 3.5–5.1)
Sodium: 140 meq/L (ref 135–145)

## 2023-06-14 LAB — LIPID PANEL
Cholesterol: 205 mg/dL — ABNORMAL HIGH (ref 0–200)
HDL: 76.2 mg/dL (ref >39.00)
LDL Cholesterol: 114 mg/dL — ABNORMAL HIGH (ref 0–99)
NonHDL: 128.66
Total CHOL/HDL Ratio: 3
Triglycerides: 74 mg/dL (ref 0.0–149.0)
VLDL: 14.8 mg/dL (ref 0.0–40.0)

## 2023-06-14 LAB — MICROALBUMIN / CREATININE URINE RATIO
Creatinine,U: 97.3 mg/dL
Microalb Creat Ratio: 8.8 mg/g (ref 0.0–30.0)
Microalb, Ur: 0.9 mg/dL (ref 0.0–1.9)

## 2023-06-14 LAB — HEMOGLOBIN A1C: Hgb A1c MFr Bld: 6 % (ref 4.6–6.5)

## 2023-06-14 NOTE — Patient Instructions (Signed)
 Continue checking your blood pressure regularly Blood pressure goal:  between 110/65 and  135/85. If it is consistently higher or lower, let me know     GO TO THE LAB :  Get the blood work   Your results will be posted on MyChart with my comments  Next office visit for a checkup in 4 months Please make an appointment before you leave today

## 2023-06-14 NOTE — Progress Notes (Unsigned)
 Subjective:    Patient ID: Joseph Golden, male    DOB: 04/16/1945, 78 y.o.   MRN: [id number]  DOS:  06/14/2023 Type of visit - description: Routine follow-up  Since the last visit he is feeling good. Normal appetite. No aches and pains. Normal energy levels. Denies lower extremity paresthesias.  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    Past Surgical History:  Procedure Laterality Date   COLONOSCOPY  2013   LYMPHADENECTOMY Bilateral 06/08/2017   Procedure: LYMPHADENECTOMY;  Surgeon: Samson Croak, MD;  Location: [facility];  Service: Urology;  Laterality: Bilateral;   NO PAST SURGERIES     PROSTATE BIOPSY     ROBOT ASSISTED LAPAROSCOPIC RADICAL PROSTATECTOMY N/A 06/08/2017   Procedure: XI ROBOTIC ASSISTED LAPAROSCOPIC RADICAL PROSTATECTOMY;  Surgeon: Samson Croak, MD;  Location: [facility];  Service: Urology;  Laterality: N/A;    Current Outpatient Medications  Medication Instructions   amLODipine  (NORVASC ) 10 mg, Oral, Daily   atorvastatin  (LIPITOR) 80 mg, Oral, Daily at bedtime   carvedilol  (COREG ) 6.25 mg, Oral, 2 times daily with meals   losartan -hydrochlorothiazide  (HYZAAR) 50-12.5 MG tablet 1 tablet, Oral, Daily   Xtandi 80 mg, 2 times daily       Objective:   Physical Exam BP 118/68   Pulse 65   Temp 97.8 F (36.6 C) (Oral)   Resp 16   Ht 5\' 10"  (1.778 m)   Wt 209 lb (94.8 kg)   SpO2 95%   BMI 29.99 kg/m  General:    Well developed, NAD, BMI noted. HEENT:  Normocephalic . Face symmetric, atraumatic Lungs:  CTA B Normal respiratory effort, no intercostal retractions, no accessory muscle use. Heart: RRR,  no murmur.  DM foot exam: No edema, good pedal pulses, pinprick examination normal.  Nails are long. Skin: Not pale. Not jaundice Neurologic:  alert & oriented X3.  Speech normal, gait appropriate for age and unassisted Psych--  Cognition and judgment appear intact.  Cooperative with normal attention span and concentration.  Behavior appropriate. No anxious or depressed appearing.      Assessment    Problem list DM (A1c 6.15 May 2021) HTN Hyperlipidemia Prostate ca dx 2019 --robotic radical prostatectomy on 5/29/20219.  --gleason score 4 + 5 = 9. 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 Lymph nodes negative  -- s/p adjuvant radiation   from 11/09/2017-01/02/2018 --XRT  left third, left fifth and right second ribs from 08/20/2020-08/29/2020.  -- on ADT therapy as of 03-2022.   GI: --Colon polyps --Iron  deficient anemia, --EGD 4-15 : Gastritis, H. pylori positive: Status post treatment. HH likely causing  Cameron erosions ---> a  cause for chronic blood loss.---(-) breath test 11-2014  12/2019: Subdural hematoma, large, s/p SEPS Abnormal CT chest-- last CTA-2024, no ongoing imaging needed  PLAN DM: Diet controlled, check A1c and micro.  Feet exam negative HTN: On Hyzaar, carvedilol .  BPs  at home always less than 125 or 80.  Check a BMP. Hyperlipidemia: On atorvastatin  80 mg daily, reports good compliance, last LDL not at goal, my intention was to add Zetia but that never happened.  Plan: Recheck FLP, advised patient will add Zetia if needed. Abnormal CT chest: 09/2023, saw pulmonary, no further follow-up CTs needed Prostate cancer, h/o: PSA 0 (03/01/2023). 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  Plan is to control HTN,  cholesterol, and consider CTA or MRA at the 1 year mark as suggested by radiology RTC 4 months

## 2023-06-15 NOTE — Assessment & Plan Note (Signed)
 DM: Diet controlled, check A1c and micro.  Feet exam negative HTN: On Hyzaar, carvedilol .  BPs at home always less than 125 or 80.  Check a BMP. Hyperlipidemia: On atorvastatin  80 mg daily, reports good compliance, last LDL not at goal, my intention was to add Zetia but that never happened.  Plan: Recheck FLP, advised patient will add Zetia if needed. Abnormal CT chest: 09/2023, saw pulmonary, no further follow-up CTs needed Prostate cancer, h/o: PSA 0 (03/01/2023). 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  Plan is to control HTN, cholesterol, and consider CTA or MRA at the 1 year mark as suggested by radiology RTC 4 months

## 2023-06-16 ENCOUNTER — Ambulatory Visit: Payer: Self-pay | Admitting: Internal Medicine

## 2023-06-16 MED ORDER — EZETIMIBE 10 MG PO TABS: 10.0000 mg | ORAL_TABLET | Freq: Every day | ORAL | 3 refills | Status: AC

## 2023-06-16 NOTE — Addendum Note (Signed)
 Addended by: Syesha Thaw D on: 06/16/2023 12:58 PM   Modules accepted: Orders

## 2023-07-08 DIAGNOSIS — C61 Malignant neoplasm of prostate: Secondary | ICD-10-CM | POA: Diagnosis not present

## 2023-08-03 LAB — PSA: PSA: <0.015

## 2023-08-18 ENCOUNTER — Ambulatory Visit

## 2023-08-18 VITALS — Ht 70.0 in | Wt 209.0 lb

## 2023-08-18 DIAGNOSIS — Z0000 Encounter for general adult medical examination without abnormal findings: Secondary | ICD-10-CM | POA: Diagnosis not present

## 2023-08-18 NOTE — Patient Instructions (Addendum)
 Joseph Golden , Thank you for taking time out of your busy schedule to complete your Annual Wellness Visit with me. I enjoyed our conversation and look forward to speaking with you again next year. I, as well as your care team,  appreciate your ongoing commitment to your health goals. Please review the following plan we discussed and let me know if I can assist you in the future. Your Game plan/ To Do List    Referrals: If you haven't heard from the office you've been referred to, please reach out to them at the phone provided.   Follow up Visits: We will see or speak with you next year for your Next Medicare AWV with our clinical staff 08/23/24 @ 1:10p Have you seen your provider in the last 6 months (3 months if uncontrolled diabetes)? Appointment scheduled for 10/14/23 @ 8:40a  Clinician Recommendations:  Aim for 30 minutes of exercise or brisk walking, 6-8 glasses of water , and 5 servings of fruits and vegetables each day.       This is a list of the screenings recommended for you:  Health Maintenance  Topic Date Due   Eye exam for diabetics  Never done   DTaP/Tdap/Td vaccine (2 - Td or Tdap) 11/03/2021   COVID-19 Vaccine (6 - 2024-25 season) 09/12/2022   Flu Shot  08/12/2023   Hemoglobin A1C  12/14/2023   Yearly kidney function blood test for diabetes  06/13/2024   Yearly kidney health urinalysis for diabetes  06/13/2024   Complete foot exam   06/13/2024   Medicare Annual Wellness Visit  08/17/2024   Pneumococcal Vaccine for age over 34  Completed   Hepatitis C Screening  Completed   Zoster (Shingles) Vaccine  Completed   Hepatitis B Vaccine  Aged Out   HPV Vaccine  Aged Out   Meningitis B Vaccine  Aged Out   Colon Cancer Screening  Discontinued    Advanced directives: (Declined) Advance directive discussed with you today. Even though you declined this today, please call our office should you change your mind, and we can give you the proper paperwork for you to fill out. Advance  Care Planning is important because it:  [x]  Makes sure you receive the medical care that is consistent with your values, goals, and preferences  [x]  It provides guidance to your family and loved ones and reduces their decisional burden about whether or not they are making the right decisions based on your wishes.  Follow the link provided in your after visit summary or read over the paperwork we have mailed to you to help you started getting your Advance Directives in place. If you need assistance in completing these, please reach out to us  so that we can help you!  See attachments for Preventive Care and Fall Prevention Tips.

## 2023-08-18 NOTE — Progress Notes (Signed)
 Subjective:   Joseph Golden is a 78 y.o. who presents for a Medicare Wellness preventive visit.  As a reminder, Annual Wellness Visits don't include a physical exam, and some assessments may be limited, especially if this visit is performed virtually. We may recommend an in-person follow-up visit with your provider if needed.  Visit Complete: Virtual I connected with  Abran LELON Pina on 08/18/23 by a audio enabled telemedicine application and verified that I am speaking with the correct person using two identifiers.  Patient Location: Home  Provider Location: Home Office  I discussed the limitations of evaluation and management by telemedicine. The patient expressed understanding and agreed to proceed.  Vital Signs: Because this visit was a virtual/telehealth visit, some criteria may be missing or patient reported. Any vitals not documented were not able to be obtained and vitals that have been documented are patient reported.   Persons Participating in Visit: Patient.  AWV Questionnaire: Yes: Patient Medicare AWV questionnaire was completed by the patient on 08/18/23; I have confirmed that all information answered by patient is correct and no changes since this date.  Cardiac Risk Factors include: advanced age (>59men, >23 women);male gender;hypertension     Objective:    Today's Vitals   08/18/23 1317  Weight: 209 lb (94.8 kg)  Height: 5' 10 (1.778 m)   Body mass index is 29.99 kg/m.     08/18/2023    1:22 PM 06/10/2021   10:23 AM 10/14/2020   12:29 PM 02/02/2018    9:25 AM 08/24/2017    9:59 AM 06/08/2017    6:56 PM 06/08/2017    6:00 AM  Advanced Directives  Does Patient Have a Medical Advance Directive? No No No No  No   No   Would patient like information on creating a medical advance directive? No - Patient declined No - Patient declined No - Patient declined No - Patient declined   No - Patient declined       Data saved with a previous flowsheet row definition     Current Medications (verified) Outpatient Encounter Medications as of 08/18/2023  Medication Sig   amLODipine  (NORVASC ) 10 MG tablet Take 1 tablet (10 mg total) by mouth daily.   atorvastatin  (LIPITOR) 80 MG tablet Take 1 tablet (80 mg total) by mouth at bedtime.   carvedilol  (COREG ) 6.25 MG tablet TAKE 1 TABLET BY MOUTH TWICE A DAY WITH FOOD   ezetimibe  (ZETIA ) 10 MG tablet Take 1 tablet (10 mg total) by mouth daily.   losartan -hydrochlorothiazide  (HYZAAR) 50-12.5 MG tablet Take 1 tablet by mouth daily.   XTANDI 40 MG tablet Take 80 mg by mouth 2 (two) times daily.   No facility-administered encounter medications on file as of 08/18/2023.    Allergies (verified) Patient has no known allergies.   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   Past Surgical History:  Procedure Laterality Date   COLONOSCOPY  2013   LYMPHADENECTOMY Bilateral 06/08/2017  Procedure: LYMPHADENECTOMY;  Surgeon: Carolee Sherwood JONETTA DOUGLAS, MD;  Location: [facility];  Service: Urology;  Laterality: Bilateral;   NO PAST SURGERIES     PROSTATE BIOPSY     ROBOT ASSISTED LAPAROSCOPIC RADICAL PROSTATECTOMY N/A 06/08/2017   Procedure: XI ROBOTIC ASSISTED LAPAROSCOPIC RADICAL PROSTATECTOMY;  Surgeon: Carolee Sherwood JONETTA DOUGLAS, MD;  Location: [facility];  Service: Urology;  Laterality: N/A;   Family History  Problem Relation Age of Onset   Hypertension Mother    Breast cancer Mother    Brain cancer Father 68   Hypertension  Sister    Hypertension Brother    Stomach cancer Other    Colon cancer Neg Hx    Esophageal cancer Neg Hx    Rectal cancer Neg Hx    Prostate cancer Neg Hx    Diabetes Neg Hx    CAD Neg Hx    Social History   Socioeconomic History   Marital status: Married    Spouse name: Not on file   Number of children: 5   Years of education: Not on file   Highest education level: Bachelor's degree (e.g., BA, AB, BS)  Occupational History   Occupation: retired   Tobacco Use   Smoking status: Former    Types: Pipe    Quit date: 2007    Years since quitting: 18.6   Smokeless tobacco: Never   Tobacco comments:    quit ~ 2005, smoked pipe   Vaping Use   Vaping status: Never Used  Substance and Sexual Activity   Alcohol use: Yes    Alcohol/week: 3.0 standard drinks of alcohol    Types: 2 Cans of beer, 1 Shots of liquor per week    Comment: social   Drug use: No   Sexual activity: Not Currently  Other Topics Concern   Not on file  Social History Narrative   Married for 50  years. Lives in [place]. One son and four daughters.   Household: wife, 1 daughters and 1 g-son   Social Drivers of Corporate investment banker Strain: Low Risk  (08/18/2023)   Overall Financial Resource Strain (CARDIA)    Difficulty of Paying Living Expenses: Not hard at all  Food Insecurity: No Food Insecurity (08/18/2023)   Hunger Vital Sign    Worried About Running Out of Food in the Last Year: Never true    Ran Out of Food in the Last Year: Never true  Recent Concern: Food Insecurity - Food Insecurity Present (06/13/2023)   Hunger Vital Sign    Worried About Running Out of Food in the Last Year: Sometimes true    Ran Out of Food in the Last Year: Never true  Transportation Needs: No Transportation Needs (08/18/2023)   PRAPARE - Administrator, Civil Service (Medical): No    Lack of Transportation (Non-Medical): No  Physical Activity: Insufficiently Active (08/18/2023)   Exercise Vital Sign    Days  of Exercise per Week: 2 days    Minutes of Exercise per Session: 30 min  Stress: No Stress Concern Present (08/18/2023)   [employer] of Occupational Health - Occupational Stress Questionnaire    Feeling of Stress: Not at all  Social Connections: Socially Integrated (08/18/2023)   Social Connection and Isolation Panel    Frequency of Communication with Friends and Family: More than three times a week    Frequency of Social Gatherings with Friends and Family: Three times a week    Attends Religious Services: More than  4 times per year    Active Member of Clubs or Organizations: Yes    Attends Banker Meetings: More than 4 times per year    Marital Status: Married    Tobacco Counseling Counseling given: Not Answered Tobacco comments: quit ~ 2005, smoked pipe     Clinical Intake:  Pre-visit preparation completed: Yes  Pain : No/denies pain     BMI - recorded: 29.99 Nutritional Status: BMI 25 -29 Overweight Nutritional Risks: None Diabetes: No  Lab Results  Component Value Date   HGBA1C 6.0 06/14/2023   HGBA1C 5.9 12/14/2022   HGBA1C 6.2 08/06/2022     How often do you need to have someone help you when you read instructions, pamphlets, or other written materials from your doctor or pharmacy?: 1 - Never  Interpreter Needed?: No  Information entered by :: Rojelio Blush LPN   Activities of Daily Living     08/18/2023    1:21 PM 08/18/2023    7:49 AM  In your present state of health, do you have any difficulty performing the following activities:  Hearing? 0 0  Vision? 0 0  Difficulty concentrating or making decisions? 0 0  Walking or climbing stairs? 0 0  Dressing or bathing? 0 0  Doing errands, shopping? 0 0  Preparing Food and eating ? N N  Using the Toilet? N N  In the past six months, have you accidently leaked urine? N N  Do you have problems with loss of bowel control? N N  Managing your Medications? N N  Managing your Finances? N N   Housekeeping or managing your Housekeeping? N N    Patient Care Team: Amon Aloysius BRAVO, MD as PCP - General (Internal Medicine) Avram Lupita BRAVO, MD as Consulting Physician (Gastroenterology) Carolee Sherwood JONETTA DOUGLAS, MD as Consulting Physician (Urology) Patrcia Cough, MD as Consulting Physician (Radiation Oncology) Crawford Morna Pickle, NP as Nurse Practitioner (Hematology and Oncology) Starla Wendelyn JONETTA, RN as Registered Nurse  I have updated your Care Teams any recent Medical Services you may have received from other providers in the past year.     Assessment:   This is a routine wellness examination for [facility].  Hearing/Vision screen Hearing Screening - Comments:: Denies hearing difficulties   Vision Screening - Comments:: Wears rx glasses - up to date with routine eye exams with  Dr Octavia   Goals Addressed               This Visit's Progress     Increase physical activity (pt-stated)        Remain Active       Depression Screen     08/18/2023    1:25 PM 06/14/2023    9:14 AM 12/14/2022   12:50 PM 08/06/2022    9:52 AM 04/07/2022   10:04 AM 12/07/2021   10:07 AM 06/10/2021   10:24 AM  PHQ 2/9 Scores  PHQ - 2 Score 0 0 0 0 0 0 0    Fall Risk     08/18/2023    1:22 PM 08/18/2023    7:49 AM 06/14/2023    9:14 AM 12/14/2022   12:50 PM 08/06/2022    9:52 AM  Fall Risk   Falls in the past year? 0 0 0 1 0  Number falls in past yr: 0 0 0 0 0  Injury with Fall? 0 0 0 0 0  Risk for fall due to : No Fall Risks  Follow up Falls evaluation completed  Falls evaluation completed;Education provided Falls evaluation completed;Education provided Falls evaluation completed    MEDICARE RISK AT HOME:  Medicare Risk at Home Any stairs in or around the home?: Yes If so, are there any without handrails?: No Home free of loose throw rugs in walkways, pet beds, electrical cords, etc?: No Adequate lighting in your home to reduce risk of falls?: Yes Life alert?: No Use of a cane, walker or  w/c?: No Grab bars in the bathroom?: No Shower chair or bench in shower?: No Elevated toilet seat or a handicapped toilet?: No  TIMED UP AND GO:  Was the test performed?  No  Cognitive Function: 6CIT completed        08/18/2023    1:22 PM 06/10/2021   10:26 AM  6CIT Screen  What Year? 0 points 0 points  What month? 0 points 0 points  What time? 0 points 0 points  Count back from 20 0 points 0 points  Months in reverse 0 points 0 points  Repeat phrase 0 points 0 points  Total Score 0 points 0 points    Immunizations Immunization History  Administered Date(s) Administered   Fluad Quad(high Dose 65+) 11/28/2018, 11/29/2019, 12/01/2020, 12/07/2021   Fluad Trivalent(High Dose 65+) 12/14/2022   Influenza, High Dose Seasonal PF 10/31/2012, 11/12/2014, 11/17/2015, 11/18/2016, 11/22/2017   Influenza,inj,Quad PF,6+ Mos 11/01/2013   PFIZER Comirnaty(Gray Top)Covid-19 Tri-Sucrose Vaccine 05/29/2020   PFIZER(Purple Top)SARS-COV-2 Vaccination 03/06/2019, 03/27/2019   PNEUMOCOCCAL CONJUGATE-20 12/07/2021   Pfizer Covid-19 Vaccine Bivalent Booster 14yrs & up 12/01/2020, 10/03/2021   Pneumococcal Conjugate-13 11/12/2014   Pneumococcal Polysaccharide-23 11/01/2013   Tdap 11/04/2011   Zoster Recombinant(Shingrix ) 06/09/2021, 10/08/2021    Screening Tests Health Maintenance  Topic Date Due   OPHTHALMOLOGY EXAM  Never done   DTaP/Tdap/Td (2 - Td or Tdap) 11/03/2021   COVID-19 Vaccine (6 - 2024-25 season) 09/12/2022   INFLUENZA VACCINE  08/12/2023   HEMOGLOBIN A1C  12/14/2023   Diabetic kidney evaluation - eGFR measurement  06/13/2024   Diabetic kidney evaluation - Urine ACR  06/13/2024   FOOT EXAM  06/13/2024   Medicare Annual Wellness (AWV)  08/17/2024   Pneumococcal Vaccine: 50+ Years  Completed   Hepatitis C Screening  Completed   Zoster Vaccines- Shingrix   Completed   Hepatitis B Vaccines  Aged Out   HPV VACCINES  Aged Out   Meningococcal B Vaccine  Aged Out   Colonoscopy   Discontinued    Health Maintenance  Health Maintenance Due  Topic Date Due   OPHTHALMOLOGY EXAM  Never done   DTaP/Tdap/Td (2 - Td or Tdap) 11/03/2021   COVID-19 Vaccine (6 - 2024-25 season) 09/12/2022   INFLUENZA VACCINE  08/12/2023   Health Maintenance Items Addressed:   Additional Screening:  Vision Screening: Recommended annual ophthalmology exams for early detection of glaucoma and other disorders of the eye. Would you like a referral to an eye doctor? No    Dental Screening: Recommended annual dental exams for proper oral hygiene  Community Resource Referral / Chronic Care Management: CRR required this visit?  No   CCM required this visit?  No   Plan:    I have personally reviewed and noted the following in the patient's chart:   Medical and social history Use of alcohol, tobacco or illicit drugs  Current medications and supplements including opioid prescriptions. Patient is not currently taking opioid prescriptions. Functional ability and status Nutritional status Physical activity Advanced directives List of other physicians  Hospitalizations, surgeries, and ER visits in previous 12 months Vitals Screenings to include cognitive, depression, and falls Referrals and appointments  In addition, I have reviewed and discussed with patient certain preventive protocols, quality metrics, and best practice recommendations. A written personalized care plan for preventive services as well as general preventive health recommendations were provided to patient.   Rojelio LELON Blush, LPN   01/13/7972   After Visit Summary: (MyChart) Due to this being a telephonic visit, the after visit summary with patients personalized plan was offered to patient via MyChart   Notes: Nothing significant to report at this time.

## 2023-08-24 ENCOUNTER — Other Ambulatory Visit: Payer: Self-pay | Admitting: Internal Medicine

## 2023-09-06 ENCOUNTER — Encounter: Payer: Self-pay | Admitting: Internal Medicine

## 2023-09-06 DIAGNOSIS — H25813 Combined forms of age-related cataract, bilateral: Secondary | ICD-10-CM | POA: Diagnosis not present

## 2023-09-06 DIAGNOSIS — H40012 Open angle with borderline findings, low risk, left eye: Secondary | ICD-10-CM | POA: Diagnosis not present

## 2023-09-06 LAB — HM DIABETES EYE EXAM

## 2023-09-21 DIAGNOSIS — C61 Malignant neoplasm of prostate: Secondary | ICD-10-CM | POA: Diagnosis not present

## 2023-09-22 ENCOUNTER — Encounter: Payer: Self-pay | Admitting: Internal Medicine

## 2023-10-05 DIAGNOSIS — R7309 Other abnormal glucose: Secondary | ICD-10-CM | POA: Diagnosis not present

## 2023-10-05 DIAGNOSIS — H40012 Open angle with borderline findings, low risk, left eye: Secondary | ICD-10-CM | POA: Diagnosis not present

## 2023-10-05 DIAGNOSIS — H25813 Combined forms of age-related cataract, bilateral: Secondary | ICD-10-CM | POA: Diagnosis not present

## 2023-10-14 ENCOUNTER — Ambulatory Visit: Admitting: Internal Medicine

## 2023-10-14 ENCOUNTER — Encounter: Payer: Self-pay | Admitting: Internal Medicine

## 2023-10-14 VITALS — BP 132/80 | HR 65 | Temp 97.9°F | Resp 16 | Ht 70.0 in | Wt 211.5 lb

## 2023-10-14 DIAGNOSIS — E785 Hyperlipidemia, unspecified: Secondary | ICD-10-CM | POA: Diagnosis not present

## 2023-10-14 DIAGNOSIS — I7789 Other specified disorders of arteries and arterioles: Secondary | ICD-10-CM

## 2023-10-14 DIAGNOSIS — Z23 Encounter for immunization: Secondary | ICD-10-CM

## 2023-10-14 DIAGNOSIS — I77819 Aortic ectasia, unspecified site: Secondary | ICD-10-CM

## 2023-10-14 DIAGNOSIS — C7951 Secondary malignant neoplasm of bone: Secondary | ICD-10-CM

## 2023-10-14 DIAGNOSIS — E119 Type 2 diabetes mellitus without complications: Secondary | ICD-10-CM | POA: Diagnosis not present

## 2023-10-14 DIAGNOSIS — I10 Essential (primary) hypertension: Secondary | ICD-10-CM | POA: Diagnosis not present

## 2023-10-14 DIAGNOSIS — C61 Malignant neoplasm of prostate: Secondary | ICD-10-CM | POA: Diagnosis not present

## 2023-10-14 LAB — CBC WITH DIFFERENTIAL/PLATELET
Basophils Absolute: 0 K/uL (ref 0.0–0.1)
Basophils Relative: 0.5 % (ref 0.0–3.0)
Eosinophils Absolute: 0.4 K/uL (ref 0.0–0.7)
Eosinophils Relative: 7.7 % — ABNORMAL HIGH (ref 0.0–5.0)
HCT: 38 % — ABNORMAL LOW (ref 39.0–52.0)
Hemoglobin: 12.5 g/dL — ABNORMAL LOW (ref 13.0–17.0)
Lymphocytes Relative: 17.2 % (ref 12.0–46.0)
Lymphs Abs: 0.8 K/uL (ref 0.7–4.0)
MCHC: 32.9 g/dL (ref 30.0–36.0)
MCV: 93.1 fl (ref 78.0–100.0)
Monocytes Absolute: 0.4 K/uL (ref 0.1–1.0)
Monocytes Relative: 9.7 % (ref 3.0–12.0)
Neutro Abs: 3 K/uL (ref 1.4–7.7)
Neutrophils Relative %: 64.9 % (ref 43.0–77.0)
Platelets: 245 K/uL (ref 150.0–400.0)
RBC: 4.08 Mil/uL — ABNORMAL LOW (ref 4.22–5.81)
RDW: 15 % (ref 11.5–15.5)
WBC: 4.6 K/uL (ref 4.0–10.5)

## 2023-10-14 LAB — BASIC METABOLIC PANEL WITH GFR
BUN: 17 mg/dL (ref 6–23)
CO2: 26 meq/L (ref 19–32)
Calcium: 9.9 mg/dL (ref 8.4–10.5)
Chloride: 103 meq/L (ref 96–112)
Creatinine, Ser: 0.8 mg/dL (ref 0.40–1.50)
GFR: 86.14 mL/min (ref >60.00)
Glucose, Bld: 109 mg/dL — ABNORMAL HIGH (ref 70–99)
Potassium: 4.2 meq/L (ref 3.5–5.1)
Sodium: 138 meq/L (ref 135–145)

## 2023-10-14 LAB — HEMOGLOBIN A1C: Hgb A1c MFr Bld: 6.2 % (ref 4.6–6.5)

## 2023-10-14 LAB — LIPID PANEL
Cholesterol: 162 mg/dL (ref 0–200)
HDL: 71.5 mg/dL (ref >39.00)
LDL Cholesterol: 75 mg/dL (ref 0–99)
NonHDL: 90.11
Total CHOL/HDL Ratio: 2
Triglycerides: 78 mg/dL (ref 0.0–149.0)
VLDL: 15.6 mg/dL (ref 0.0–40.0)

## 2023-10-14 NOTE — Patient Instructions (Signed)
 GO TO THE LAB :  Get the blood work    Then, go to the front desk for the checkout Please make an appointment for a follow-up in 5 months   Will arrange for imaging on your chest to follow-up on the aorta enlargement  You got your flu shot today recommend to have COVID booster  Check the  blood pressure regularly Blood pressure goal:  between 110/65 and  135/85. If it is consistently higher or lower, let me know       Please read more detailed instructions below     ECTASIA OF ASCENDING THORACIC AORTA: You have an enlarged aorta measuring 4.1 cm, which was found on a previous CT scan. -We will order an MRA of your aorta to get a better look at this enlargement.  HYPERTENSION: Your blood pressure is well-controlled with your current medications. -Continue taking Hyzaar, carvedilol , and amlodipine  as prescribed. -Please check your blood pressure at home regularly.   HYPERLIPIDEMIA: Your cholesterol levels are being managed with atorvastatin  and Zetia . -Continue taking atorvastatin  and Zetia  as prescribed. - We are checking your cholesterol today  TYPE 2 DIABETES MELLITUS, DIET CONTROLLED: Your diabetes is currently managed with diet control. -We will check your hemoglobin A1c to monitor your diabetes.                      Contains text generated by [employer]bridge.                                 Contains text generated by [employer]bridge.

## 2023-10-14 NOTE — Progress Notes (Signed)
 Subjective:    Patient ID: Joseph Golden, male    DOB: 05/16/1945, 78 y.o.   MRN: [id number]  DOS:  10/14/2023  Discussed the use of AI scribe software for clinical note transcription with the patient, who gave verbal consent to proceed.  History of Present Illness Follow-up Hypertension and cardiovascular risk management - Blood pressure remains stable, typically ranging from 125 to 130 mmHg systolic and in the 80s diastolic - Takes Hyzaar, carvedilol , and amlodipine  for blood pressure control - Takes Lipitor and Zetia  for hyperlipidemia - No chest pain, shortness of breath, or palpitations  Aortic dilation - CT scan from August 2024 incidentally noted an enlarged aorta - Unaware of prior discussion regarding this finding - No chest pain or respiratory difficulties  Gastrointestinal and metabolic symptoms - No nausea, vomiting, or diarrhea - Does not monitor blood sugars at home    Review of Systems See above   Past Medical History:  Diagnosis Date   Allergy    SEASONAL   Aortic atherosclerosis    Diverticulosis 11/16/2012   Glaucoma suspect    Helicobacter pylori gastritis 04/23/2013   Hepatic steatosis    Hiatal hernia    large   HTN (hypertension)    Iron  deficiency anemia secondary to blood loss (chronic) - Hiatal hernia with Ole erosions 03/01/2013   Other and unspecified hyperlipidemia 10/31/2012   Personal history of colonic adenoma 12/30/2011   12/2011 - 12 mm adenoma with high-grade dysplasia   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    Past Surgical History:  Procedure Laterality Date   COLONOSCOPY  2013   LYMPHADENECTOMY Bilateral 06/08/2017   Procedure: LYMPHADENECTOMY;  Surgeon: Carolee Sherwood JONETTA DOUGLAS, MD;  Location: [facility];  Service: Urology;  Laterality: Bilateral;   NO PAST SURGERIES     PROSTATE BIOPSY     ROBOT ASSISTED LAPAROSCOPIC RADICAL PROSTATECTOMY N/A 06/08/2017   Procedure: XI ROBOTIC ASSISTED LAPAROSCOPIC RADICAL  PROSTATECTOMY;  Surgeon: Carolee Sherwood JONETTA DOUGLAS, MD;  Location: [facility];  Service: Urology;  Laterality: N/A;    Current Outpatient Medications  Medication Instructions   amLODipine  (NORVASC ) 10 mg, Oral, Daily   atorvastatin  (LIPITOR) 80 mg, Oral, Daily at bedtime   carvedilol  (COREG ) 6.25 mg, Oral, 2 times daily with meals   ezetimibe  (ZETIA ) 10 mg, Oral, Daily   losartan -hydrochlorothiazide  (HYZAAR) 50-12.5 MG tablet 1 tablet, Oral, Daily   Xtandi 80 mg, 2 times daily       Objective:   Physical Exam BP 132/80   Pulse 65   Temp 97.9 F (36.6 C) (Oral)   Resp 16   Ht 5' 10 (1.778 m)   Wt 211 lb 8 oz (95.9 kg)   SpO2 97%   BMI 30.35 kg/m  General:   Well developed, NAD, BMI noted. HEENT:  Normocephalic . Face symmetric, atraumatic Lungs:  CTA B Normal respiratory effort, no intercostal retractions, no accessory muscle use. Heart: RRR,  no murmur.  Lower extremities: no pretibial edema bilaterally  Skin: Not pale. Not jaundice Neurologic:  alert & oriented X3.  Speech normal, gait appropriate for age and unassisted Psych--  Cognition and judgment appear intact.  Cooperative with normal attention span and concentration.  Behavior appropriate. No anxious or depressed appearing.      Assessment     Problem list DM (A1c 6.15 May 2021) HTN Hyperlipidemia Prostate ca dx 2019 --robotic radical prostatectomy on 5/29/20219.  --gleason score 4 + 5 = 9. 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 Lymph nodes negative  -- s/p adjuvant radiation   from 11/09/2017-01/02/2018 --XRT  left third, left fifth and right second ribs from 08/20/2020-08/29/2020.  -- on ADT therapy as of 03-2022.   GI: --Colon polyps --Iron  deficient anemia, --EGD 4-15 : Gastritis, H. pylori positive: Status post treatment. HH likely causing  Cameron erosions ---> a  cause for chronic blood loss.---(-) breath test 11-2014  12/2019: Subdural hematoma, large, s/p SEPS Abnormal CT chest--  last CTA-2024, no ongoing imaging needed  Assessment & Plan Ectasia of ascending thoracic aorta Ectasia of the ascending aorta measuring 4.1 cm, previously identified on CT scan. - Order MRA of the aorta to assess ectasia as suggested by radiology report. Hypertension Blood pressure is well-controlled with current regimen, consistently in the range of 125-130/80s mmHg. - Continue current medications: Hyzaar, carvedilol , and amlodipine .  BMP and CBC Hyperlipidemia Currently managed with atorvastatin  and Zetia , which was added a few months ago. - Order fasting lipid panel (FLP). Type 2 diabetes mellitus, diet controlled Diabetes is currently managed with diet control. No home blood sugar monitoring reported. - Check hemoglobin A1c. Prostate cancer per urology last visit 09/21/2023,  controlled Preventive care: Flu shot today, COVID booster is recommended RTC 5 months routine checkup

## 2023-10-14 NOTE — Assessment & Plan Note (Signed)
 Ectasia of ascending thoracic aorta Ectasia of the ascending aorta measuring 4.1 cm, previously identified on CT scan. - Order MRA of the aorta to assess ectasia as suggested by radiology report. Hypertension Blood pressure is well-controlled with current regimen, consistently in the range of 125-130/80s mmHg. - Continue current medications: Hyzaar, carvedilol , and amlodipine .  BMP and CBC Hyperlipidemia Currently managed with atorvastatin  and Zetia , which was added a few months ago. - Order fasting lipid panel (FLP). Type 2 diabetes mellitus, diet controlled Diabetes is currently managed with diet control. No home blood sugar monitoring reported. - Check hemoglobin A1c. Prostate cancer per urology last visit 09/21/2023,  controlled Preventive care: Flu shot today, COVID booster is recommended RTC 5 months routine checkup

## 2023-10-18 ENCOUNTER — Ambulatory Visit: Payer: Self-pay | Admitting: Internal Medicine

## 2023-11-14 ENCOUNTER — Other Ambulatory Visit: Payer: Self-pay | Admitting: Internal Medicine

## 2023-11-18 ENCOUNTER — Other Ambulatory Visit: Payer: Self-pay | Admitting: Internal Medicine

## 2023-12-02 ENCOUNTER — Encounter: Payer: Self-pay | Admitting: Oncology

## 2024-02-16 ENCOUNTER — Other Ambulatory Visit: Payer: Self-pay | Admitting: Internal Medicine

## 2024-03-13 ENCOUNTER — Ambulatory Visit: Admitting: Internal Medicine

## 2024-08-23 ENCOUNTER — Ambulatory Visit
